# Patient Record
Sex: Female | Born: 1955 | Race: White | Hispanic: No | Marital: Single | Smoking: Former smoker
Health system: Southern US, Community
[De-identification: ages and names within clinical notes are randomized; demographics above are authoritative.]

## PROBLEM LIST (undated history)

## (undated) DIAGNOSIS — N189 Chronic kidney disease, unspecified: Secondary | ICD-10-CM

## (undated) DIAGNOSIS — F419 Anxiety disorder, unspecified: Secondary | ICD-10-CM

## (undated) DIAGNOSIS — I509 Heart failure, unspecified: Secondary | ICD-10-CM

## (undated) DIAGNOSIS — M8580 Other specified disorders of bone density and structure, unspecified site: Secondary | ICD-10-CM

## (undated) DIAGNOSIS — E119 Type 2 diabetes mellitus without complications: Secondary | ICD-10-CM

## (undated) DIAGNOSIS — A630 Anogenital (venereal) warts: Secondary | ICD-10-CM

## (undated) DIAGNOSIS — J449 Chronic obstructive pulmonary disease, unspecified: Secondary | ICD-10-CM

## (undated) DIAGNOSIS — E669 Obesity, unspecified: Secondary | ICD-10-CM

## (undated) DIAGNOSIS — E781 Pure hyperglyceridemia: Secondary | ICD-10-CM

## (undated) HISTORY — DX: Heart failure, unspecified: I50.9

## (undated) HISTORY — DX: Obesity, unspecified: E66.9

## (undated) HISTORY — DX: Anxiety disorder, unspecified: F41.9

## (undated) HISTORY — PX: ABDOMINAL HYSTERECTOMY: SHX81

## (undated) HISTORY — DX: Type 2 diabetes mellitus without complications: E11.9

## (undated) HISTORY — PX: FRACTURE SURGERY: SHX138

## (undated) HISTORY — DX: Other specified disorders of bone density and structure, unspecified site: M85.80

## (undated) HISTORY — DX: Chronic obstructive pulmonary disease, unspecified: J44.9

## (undated) HISTORY — DX: Pure hyperglyceridemia: E78.1

## (undated) HISTORY — PX: CATARACT EXTRACTION: SUR2

## (undated) HISTORY — DX: Anogenital (venereal) warts: A63.0

## (undated) HISTORY — DX: Chronic kidney disease, unspecified: N18.9

---

## 2007-09-02 HISTORY — PX: OTHER SURGICAL HISTORY: SHX169

## 2010-10-23 ENCOUNTER — Encounter: Payer: Self-pay | Admitting: Nurse Practitioner

## 2011-08-19 ENCOUNTER — Ambulatory Visit: Payer: PRIVATE HEALTH INSURANCE | Attending: Neurology | Admitting: Physical Therapy

## 2011-08-19 DIAGNOSIS — M545 Low back pain, unspecified: Secondary | ICD-10-CM | POA: Insufficient documentation

## 2011-08-19 DIAGNOSIS — R5381 Other malaise: Secondary | ICD-10-CM | POA: Insufficient documentation

## 2011-08-19 DIAGNOSIS — M542 Cervicalgia: Secondary | ICD-10-CM | POA: Insufficient documentation

## 2011-08-19 DIAGNOSIS — IMO0001 Reserved for inherently not codable concepts without codable children: Secondary | ICD-10-CM | POA: Insufficient documentation

## 2011-08-21 ENCOUNTER — Ambulatory Visit: Payer: PRIVATE HEALTH INSURANCE | Admitting: Physical Therapy

## 2011-08-27 ENCOUNTER — Encounter: Payer: PRIVATE HEALTH INSURANCE | Admitting: *Deleted

## 2011-08-29 ENCOUNTER — Ambulatory Visit: Payer: PRIVATE HEALTH INSURANCE | Admitting: *Deleted

## 2011-09-04 ENCOUNTER — Ambulatory Visit: Payer: PRIVATE HEALTH INSURANCE | Attending: Neurology | Admitting: Physical Therapy

## 2011-09-04 ENCOUNTER — Encounter: Payer: PRIVATE HEALTH INSURANCE | Admitting: Physical Therapy

## 2011-09-04 DIAGNOSIS — M542 Cervicalgia: Secondary | ICD-10-CM | POA: Insufficient documentation

## 2011-09-04 DIAGNOSIS — IMO0001 Reserved for inherently not codable concepts without codable children: Secondary | ICD-10-CM | POA: Insufficient documentation

## 2011-09-04 DIAGNOSIS — R5381 Other malaise: Secondary | ICD-10-CM | POA: Insufficient documentation

## 2011-09-04 DIAGNOSIS — M545 Low back pain, unspecified: Secondary | ICD-10-CM | POA: Insufficient documentation

## 2011-09-05 ENCOUNTER — Encounter: Payer: PRIVATE HEALTH INSURANCE | Admitting: Physical Therapy

## 2011-09-05 ENCOUNTER — Ambulatory Visit: Payer: PRIVATE HEALTH INSURANCE | Admitting: Physical Therapy

## 2011-09-12 ENCOUNTER — Ambulatory Visit: Payer: PRIVATE HEALTH INSURANCE | Admitting: Physical Therapy

## 2011-09-13 ENCOUNTER — Ambulatory Visit: Payer: PRIVATE HEALTH INSURANCE | Admitting: Physical Therapy

## 2011-09-19 ENCOUNTER — Ambulatory Visit: Payer: PRIVATE HEALTH INSURANCE | Admitting: Physical Therapy

## 2011-09-20 ENCOUNTER — Ambulatory Visit: Payer: PRIVATE HEALTH INSURANCE | Admitting: Physical Therapy

## 2011-09-26 ENCOUNTER — Ambulatory Visit: Payer: PRIVATE HEALTH INSURANCE | Admitting: Physical Therapy

## 2011-09-27 ENCOUNTER — Ambulatory Visit: Payer: PRIVATE HEALTH INSURANCE | Admitting: Physical Therapy

## 2012-07-29 LAB — HM MAMMOGRAPHY

## 2012-10-08 ENCOUNTER — Telehealth: Payer: Self-pay | Admitting: Nurse Practitioner

## 2012-10-08 NOTE — Telephone Encounter (Signed)
Appt given for 5/9

## 2012-10-09 ENCOUNTER — Encounter: Payer: Self-pay | Admitting: Nurse Practitioner

## 2012-10-09 ENCOUNTER — Ambulatory Visit (INDEPENDENT_AMBULATORY_CARE_PROVIDER_SITE_OTHER): Payer: PRIVATE HEALTH INSURANCE | Admitting: Nurse Practitioner

## 2012-10-09 VITALS — BP 109/65 | HR 108 | Temp 98.7°F | Ht 64.0 in | Wt 208.0 lb

## 2012-10-09 DIAGNOSIS — A630 Anogenital (venereal) warts: Secondary | ICD-10-CM

## 2012-10-09 DIAGNOSIS — E119 Type 2 diabetes mellitus without complications: Secondary | ICD-10-CM | POA: Insufficient documentation

## 2012-10-09 DIAGNOSIS — J449 Chronic obstructive pulmonary disease, unspecified: Secondary | ICD-10-CM | POA: Insufficient documentation

## 2012-10-09 DIAGNOSIS — E785 Hyperlipidemia, unspecified: Secondary | ICD-10-CM | POA: Insufficient documentation

## 2012-10-09 DIAGNOSIS — E559 Vitamin D deficiency, unspecified: Secondary | ICD-10-CM | POA: Insufficient documentation

## 2012-10-09 DIAGNOSIS — M8580 Other specified disorders of bone density and structure, unspecified site: Secondary | ICD-10-CM | POA: Insufficient documentation

## 2012-10-09 DIAGNOSIS — R609 Edema, unspecified: Secondary | ICD-10-CM | POA: Insufficient documentation

## 2012-10-09 DIAGNOSIS — I10 Essential (primary) hypertension: Secondary | ICD-10-CM | POA: Insufficient documentation

## 2012-10-09 DIAGNOSIS — F411 Generalized anxiety disorder: Secondary | ICD-10-CM | POA: Insufficient documentation

## 2012-10-09 NOTE — Patient Instructions (Signed)
Genital Warts Genital warts are a sexually transmitted infection. They may appear as small bumps on the tissues of the genital area. CAUSES  Genital warts are caused by a virus called human papillomavirus (HPV). HPV is the most common sexually transmitted disease (STD) and infection of the sex organs. This infection is spread by having unprotected sex with an infected person. It can be spread by vaginal, anal, and oral sex. Many people do not know they are infected. They may be infected for years without problems. However, even if they do not have problems, they can unknowingly pass the infection to their sexual partners. SYMPTOMS   Itching and irritation in the genital area.  Warts that bleed.  Painful sexual intercourse. DIAGNOSIS  Warts are usually recognized with the naked eye on the vagina, vulva, perineum, anus, and rectum. Certain tests can also diagnose genital warts, such as:  A Pap test.  A tissue sample (biopsy) exam.  Colposcopy. A magnifying tool is used to examine the vagina and cervix. The HPV cells will change color when certain solutions are used. TREATMENT  Warts can be removed by:  Applying certain chemicals, such as cantharidin or podophyllin.  Liquid nitrogen freezing (cryotherapy).  Immunotherapy with candida or trichophyton injections.  Laser treatment.  Burning with an electrified probe (electrocautery).  Interferon injections.  Surgery. PREVENTION  HPV vaccination can help prevent HPV infections that cause genital warts and that cause cancer of the cervix. It is recommended that the vaccination be given to people between the ages 44 to 62 years old. The vaccine might not work as well or might not work at all if you already have HPV. It should not be given to pregnant women. HOME CARE INSTRUCTIONS   It is important to follow your caregiver's instructions. The warts will not go away without treatment. Repeat treatments are often needed to get rid of warts.  Even after it appears that the warts are gone, the normal tissue underneath often remains infected.  Do not try to treat genital warts with medicine used to treat hand warts. This type of medicine is strong and can burn the skin in the genital area, causing more damage.  Tell your past and current sexual partner(s) that you have genital warts. They may be infected also and need treatment.  Avoid sexual contact while being treated.  Do not touch or scratch the warts. The infection may spread to other parts of your body.  Women with genital warts should have a cervical cancer check (Pap test) at least once a year. This type of cancer is slow-growing and can be cured if found early. Chances of developing cervical cancer are increased with HPV.  Inform your obstetrician about your warts in the event of pregnancy. This virus can be passed to the baby's respiratory tract. Discuss this with your caregiver.  Use a condom during sexual intercourse. Following treatment, the use of condoms will help prevent reinfection.  Ask your caregiver about using over-the-counter anti-itch creams. SEEK MEDICAL CARE IF:   Your treated skin becomes red, swollen, or painful.  You have a fever.  You feel generally ill.  You feel little lumps in and around your genital area.  You are bleeding or have painful sexual intercourse. MAKE SURE YOU:   Understand these instructions.  Will watch your condition.  Will get help right away if you are not doing well or get worse. Document Released: 05/17/2000 Document Revised: 08/12/2011 Document Reviewed: 11/26/2010 [facility] Patient Information 2013 [place].

## 2012-10-09 NOTE — Progress Notes (Signed)
  Subjective:    Patient ID: Annette Gibson, female    DOB: 02-19-1956, 57 y.o.   MRN: [id number]  HPI patient in today wanting a skin lesion removed from her perineal area. Has been there for years but is getting bigger. Patient wants it removed.    Review of Systems  All other systems reviewed and are negative.       Objective:   Physical Exam  Skin:  Cauliflower appearing flesh colored lesion left perineal area.          Assessment & Plan:  1. Perianal condylomata DO not pick or scratch area - Ambulatory referral to Gynecology  [place], [place]

## 2012-11-20 ENCOUNTER — Telehealth: Payer: Self-pay | Admitting: Nurse Practitioner

## 2012-11-20 NOTE — Telephone Encounter (Signed)
Samples up front - ok per mmm

## 2012-12-03 ENCOUNTER — Ambulatory Visit (INDEPENDENT_AMBULATORY_CARE_PROVIDER_SITE_OTHER): Payer: 59 | Admitting: General Surgery

## 2012-12-03 ENCOUNTER — Encounter (INDEPENDENT_AMBULATORY_CARE_PROVIDER_SITE_OTHER): Payer: Self-pay | Admitting: General Surgery

## 2012-12-03 VITALS — BP 142/80 | HR 64 | Resp 14 | Ht 64.0 in | Wt 210.2 lb

## 2012-12-03 DIAGNOSIS — A630 Anogenital (venereal) warts: Secondary | ICD-10-CM

## 2012-12-03 NOTE — Progress Notes (Signed)
Patient ID: Annette Gibson, female   DOB: Jun 15, 1955, 57 y.o.   MRN: [id number]  No chief complaint on file.   HPI Annette Gibson. Bencosme is a 57 y.o. female.  Referred by Dr. Adah Perl for evaluation of anal condyloma.  She was concerned for the close proximity to the rectum. She says that she had noticed the lesion in the area for over a year now but it has been increasing in size. Does not causing her discomfort. She denies any other lesions. She denies any history of HIV HPI  Past Medical History  Diagnosis Date  . Anxiety   . Vitamin D deficiency   . Obesity     metabolic syndrome   . Hypertriglyceridemia   . Osteopenia   . COPD (chronic obstructive pulmonary disease)   . Diabetes mellitus without complication   . Anal warts     anal warts    Past Surgical History  Procedure Laterality Date  . Lt ganglion cyst removal  09/2007  . Abdominal hysterectomy    . Fracture surgery      Right leg and ankle    History reviewed. No pertinent family history.  Social History History  Substance Use Topics  . Smoking status: Current Every Day Smoker  . Smokeless tobacco: Not on file  . Alcohol Use: Not on file    Allergies  Allergen Reactions  . Celebrex (Celecoxib)     Current Outpatient Prescriptions  Medication Sig Dispense Refill  . albuterol (PROVENTIL HFA;VENTOLIN HFA) 108 (90 BASE) MCG/ACT inhaler Inhale 1 puff into the lungs every 6 (six) hours as needed.        Marland Kitchen alendronate (FOSAMAX) 70 MG tablet Take 70 mg by mouth every 7 (seven) days. Take with a full glass of water on an empty stomach.      Marland Kitchen atorvastatin (LIPITOR) 40 MG tablet Take 40 mg by mouth daily.      . fenofibrate 160 MG tablet Take 160 mg by mouth daily.      . Fluticasone Furoate-Vilanterol (BREO ELLIPTA) 100-25 MCG/INH AEPB Inhale into the lungs as needed.      . furosemide (LASIX) 20 MG tablet Take 20 mg by mouth daily.      . metFORMIN (GLUCOPHAGE) 500 MG tablet Take 500 mg by mouth daily.      .  Probiotic Product (FLORAJEN3 PO) Take 460 mg by mouth daily. Take one tablet by mouth daily        . Fluticasone-Salmeterol (ADVAIR) 250-50 MCG/DOSE AEPB Inhale 1 puff into the lungs every 12 (twelve) hours.      Marland Kitchen levofloxacin (LEVAQUIN) 500 MG tablet Take 500 mg by mouth daily. take one tablet by mouth daily for 5 days       . predniSONE (DELTASONE) 10 MG tablet Take 10 mg by mouth daily. Take four tablets by mouth daily          No current facility-administered medications for this visit.    Review of Systems Review of Systems All other review of systems negative or noncontributory except as stated in the HPI  Blood pressure 142/80, pulse 64, resp. rate 14, height _0  (1.626 m), weight 210 lb 3.2 oz (95.346 kg).  Physical Exam Physical Exam Physical Exam  Nursing note and vitals reviewed. Constitutional: She is oriented to person, place, and time. She appears well-developed and well-nourished. No distress.  HENT:  Head: Normocephalic and atraumatic.  Mouth/Throat: No oropharyngeal exudate.  Eyes: Conjunctivae and EOM are normal. Pupils  are equal, round, and reactive to light. Right eye exhibits no discharge. Left eye exhibits no discharge. No scleral icterus.  Neck: Normal range of motion. Neck supple. No tracheal deviation present.  Cardiovascular: Normal rate, regular rhythm, normal heart sounds and intact distal pulses.   Pulmonary/Chest: Effort normal and breath sounds normal. No stridor. No respiratory distress. She has no wheezes.  Abdominal: Soft. Bowel sounds are normal. She exhibits no distension and no mass. There is no tenderness. There is no rebound and no guarding.  Musculoskeletal: Normal range of motion. She exhibits no edema and no tenderness.  Neurological: She is alert and oriented to person, place, and time.  Skin: Skin is warm and dry. No rash noted. She is not diaphoretic. No erythema. No pallor. She has a 2cm condyloma anterior to the rectum and in the  perineum area.  No other lesions identified. Psychiatric: She has a normal mood and affect. Her behavior is normal. Judgment and thought content normal.    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 I discussed with her the options for surgical excision versus topical medications versus laser therapy. She is not interested in surgical excision at this time but she says that she was referred down here for laser therapy and she would like to have this option. I offered to refer her to our colorectal surgeon for consideration for laser treatment.  If she would like to have these treated with excision, then we would be happy to set her up for removal.     Plan    Schedule her with Dr. Marcello Moores to consider laser treatment        [place], [place] 12/03/2012, 8:46 AM

## 2012-12-28 ENCOUNTER — Ambulatory Visit (INDEPENDENT_AMBULATORY_CARE_PROVIDER_SITE_OTHER): Payer: 59 | Admitting: General Surgery

## 2012-12-28 ENCOUNTER — Encounter (INDEPENDENT_AMBULATORY_CARE_PROVIDER_SITE_OTHER): Payer: Self-pay | Admitting: General Surgery

## 2012-12-28 ENCOUNTER — Telehealth (INDEPENDENT_AMBULATORY_CARE_PROVIDER_SITE_OTHER): Payer: Self-pay | Admitting: General Surgery

## 2012-12-28 VITALS — BP 140/82 | HR 84 | Temp 98.0°F | Resp 16 | Ht 64.0 in | Wt 209.8 lb

## 2012-12-28 DIAGNOSIS — A630 Anogenital (venereal) warts: Secondary | ICD-10-CM

## 2012-12-28 NOTE — Telephone Encounter (Signed)
Discussed pt financial responsibility and placed in pending folder

## 2012-12-28 NOTE — Patient Instructions (Signed)
Anal Warts  What are anal warts? Anal warts (also called "condyloma acuminata") are a condition that affects the area around and inside the anus. They may also affect the skin of the genital area. They first appear as tiny spots or growths, perhaps as small as the head of a pin, and may grow quite large and cover the entire anal area. Usually, they do not cause pain or discomfort to afflicted individuals and patients may be unaware that the warts are present. Some patients will experience symptoms, such as itching, bleeding, mucus discharge and/or a feeling of a lump or mass in the anal area.  What causes anal warts? They are caused by the human papilloma virus (HPV), which is transmitted from person to person by direct contact. HPV is considered a sexually transmitted disease (STD). You do not have to have anal intercourse to develop anal warts. Do anal warts always need to be removed? Yes. If they are not removed, the warts usually grow larger and multiply. Left untreated, the warts may lead to an increased risk of cancer in the affected area. What treatments are available? If warts are very small and are located only on the skin around the anus, they may be treated with a topical medication. They may also be treated by freezing the warts with liquid nitrogen or removed surgically. Surgery typically involves cutting or burning the warts off. While this provides immediate results, it must be performed using either a local anesthetic - such as novocaine - or a general or spinal anesthetic, depending on the number and exact location of warts being treated. It is important that an internal anal examination with an instrument called an anoscope be done by your treating physician to ensure you do not have any inside the anal canal (internal anal warts). Internal anal warts may not be as suitable for treatment by topical medications, and may need to be treated surgically. Additionally, your physician may wish to  examine the entire pelvic region to include the vaginal or penile area to look for other warts that may require treatment. Must I be hospitalized for surgical treatment? Surgical treatment of anal warts is usually performed as outpatient surgery. How much time will I lose from work after surgical treatment? Most people are moderately uncomfortable for a few days after treatment and pain medication may be prescribed. Depending on the extent of the disease, some people return to work the next day, while others may remain out of work for several days to weeks. Will a single treatment cure the problem? When warts are extensive, your surgeon may wish to perform the surgery in stages. Additionally, recurrent warts are common. The virus that causes the warts can live concealed in tissues that appear normal for several months before another wart develops. As new warts develop, they usually can be treated in the physician's office. Sometimes new warts develop so rapidly that office treatment would be quite uncomfortable. In these situations, a second and, occasionally, third outpatient surgical visit may be recommended. How long is treatment usually continued? Follow-up visits are necessary at frequent intervals for several months after all warts appear to be gone, to be certain that no new warts occur. What can be done to avoid getting these warts again? In some cases, warts may recur repeatedly after successful removal, since the virus that causes the warts often persists in a dormant state in body tissues. Discuss with your physician how often you should be evaluated for recurrent warts. Abstain from sexual  contact with individuals who have anal (or genital) warts. Since many individuals may be unaware that they suffer from this condition, sexual abstinence, condom protection or limiting sexual contact to single partner will reduce your potential exposure to the contagious virus that causes these warts. As a  precaution, sexual partners ought to be checked for warts and other sexual transmitted diseases, even if they have no symptoms. What is a colon and rectal surgeon? Colon and rectal surgeons are experts in the surgical and non-surgical treatment of diseases of the colon, rectum and anus. They have completed advanced surgical training in the treatment of these diseases as well as full general surgical training. Board-certified colon and rectal surgeons complete residencies in general surgery and colon and rectal surgery, and pass intensive examinations conducted by the American Board of Surgery and the American Board of Colon and Rectal Surgery. They are well-versed in the treatment of both benign and malignant diseases of the colon, rectum and anus and are able to perform routine screening examinations and surgically treat conditions if indicated to do so. author: Heath Lark, MD, FASCRS, on behalf of the [place]  2012 American Society of Colon & Rectal Surgeons

## 2012-12-28 NOTE — Progress Notes (Signed)
Chief Complaint  Patient presents with  . New Evaluation    eval anal condyloma    HISTORY:  Annette Gibson is a 57 y.o. female who presents to clinic with condyloma.  She is here today to discuss laser treatment.  She denies any pain or bleeding.  She has never had this before.  She has had the lesion for at least several years.  Past Medical History  Diagnosis Date  . Anxiety   . Vitamin D deficiency   . Obesity     metabolic syndrome   . Hypertriglyceridemia   . Osteopenia   . COPD (chronic obstructive pulmonary disease)   . Diabetes mellitus without complication   . Anal warts     anal warts       Past Surgical History  Procedure Laterality Date  . Lt ganglion cyst removal  09/2007  . Abdominal hysterectomy    . Fracture surgery      Right leg and ankle      Current Outpatient Prescriptions  Medication Sig Dispense Refill  . albuterol (PROVENTIL HFA;VENTOLIN HFA) 108 (90 BASE) MCG/ACT inhaler Inhale 1 puff into the lungs every 6 (six) hours as needed.        Marland Kitchen alendronate (FOSAMAX) 70 MG tablet Take 70 mg by mouth every 7 (seven) days. Take with a full glass of water on an empty stomach.      Marland Kitchen atorvastatin (LIPITOR) 40 MG tablet Take 40 mg by mouth daily.      . fenofibrate 160 MG tablet Take 160 mg by mouth daily.      . Fluticasone Furoate-Vilanterol (BREO ELLIPTA) 100-25 MCG/INH AEPB Inhale into the lungs as needed.      . Fluticasone-Salmeterol (ADVAIR) 250-50 MCG/DOSE AEPB Inhale 1 puff into the lungs every 12 (twelve) hours.      . furosemide (LASIX) 20 MG tablet Take 20 mg by mouth daily.      Marland Kitchen levofloxacin (LEVAQUIN) 500 MG tablet Take 500 mg by mouth daily. take one tablet by mouth daily for 5 days       . metFORMIN (GLUCOPHAGE) 500 MG tablet Take 500 mg by mouth daily.      . predniSONE (DELTASONE) 10 MG tablet Take 10 mg by mouth daily. Take four tablets by mouth daily         . Probiotic Product (FLORAJEN3 PO) Take 460 mg by mouth daily. Take one tablet  by mouth daily         No current facility-administered medications for this visit.     Allergies  Allergen Reactions  . Celebrex (Celecoxib)       History reviewed. No pertinent family history.    History   Social History  . Marital Status: Divorced    Spouse Name: N/A    Number of Children: N/A  . Years of Education: N/A   Social History Main Topics  . Smoking status: Current Every Day Smoker  . Smokeless tobacco: None  . Alcohol Use: None  . Drug Use: None  . Sexually Active: None   Other Topics Concern  . None   Social History Narrative  . None       REVIEW OF SYSTEMS - PERTINENT POSITIVES ONLY: Review of Systems - General ROS: negative for - chills or fever Hematological and Lymphatic ROS: negative for - bleeding problems Respiratory ROS: no cough, shortness of breath, or wheezing Cardiovascular ROS: no chest pain or dyspnea on exertion Gastrointestinal ROS: no abdominal pain, change  in bowel habits, or black or bloody stools Genito-Urinary ROS: no dysuria, trouble voiding, or hematuria  EXAM: Filed Vitals:   12/28/12 1022  BP: 140/82  Pulse: 84  Temp: 98 F (36.7 C)  Resp: 16    General appearance: alert and cooperative Resp: clear to auscultation bilaterally Cardio: regular rate and rhythm GI: soft, non-tender; bowel sounds normal; no masses,  no organomegaly Anal exam: anterior perianal condyloma, no other lesions seen Internal anal exam deferred  ASSESSMENT AND PLAN: Annette Gibson is a 57 y.o. F with an anal condyloma.  We discussed the treatment options in detail.  She would like to proceed with laser ablation.  We will schedule this at her convenience.    We discussed the management of anal warts. We discussed chemical destruction, immunotherapy, and surgical excision. I discussed the pros and cons of each approach. We discussed the risk and benefits and the expected outcome with chemical destruction with agents such as podophyllin. I  explained that podophyllin is generally not been effective and has a high recurrence rate. We discussed the use of Aldara ointment. I explained that it has a 30-70% chance at resolving or at least reducing the number of anal warts. I explained that it is applied 3 times a week at night and left on overnight. I explained that skin irritation is the most common side effect. We then discussed surgical excision specifically excision and fulguration. I explained how the surgery is performed. I explained that it can be painful however it generally has the highest success rate. We discussed the risk and benefits of surgery including but not limited to bleeding, infection, injury to surrounding structures, need to do a formal anoscopic exam to evaluate for anal canal warts, urinary retention, wart recurrence, and general anesthesia risk. We discussed the typical aftercare.     Rosario Adie, MD Colon and Rectal Surgery / [facility]      Visit Diagnoses: 1. Anal wart     Primary Care Physician: Redge Gainer, MD

## 2013-01-27 ENCOUNTER — Other Ambulatory Visit: Payer: Self-pay

## 2013-01-27 MED ORDER — FLUTICASONE FUROATE-VILANTEROL 100-25 MCG/INH IN AEPB: 1.0000 | INHALATION_SPRAY | RESPIRATORY_TRACT | Status: DC | PRN

## 2013-01-27 NOTE — Telephone Encounter (Signed)
Last seen 10/09/12  MMM  Patient requesting this RX from [facility]

## 2013-02-15 ENCOUNTER — Telehealth: Payer: Self-pay | Admitting: Nurse Practitioner

## 2013-02-16 NOTE — Telephone Encounter (Signed)
Appt scheduled

## 2013-03-01 ENCOUNTER — Encounter: Payer: Self-pay | Admitting: Nurse Practitioner

## 2013-03-01 ENCOUNTER — Ambulatory Visit (INDEPENDENT_AMBULATORY_CARE_PROVIDER_SITE_OTHER): Payer: PRIVATE HEALTH INSURANCE | Admitting: Nurse Practitioner

## 2013-03-01 VITALS — BP 105/65 | HR 85 | Temp 97.8°F | Ht 65.0 in | Wt 212.0 lb

## 2013-03-01 DIAGNOSIS — E119 Type 2 diabetes mellitus without complications: Secondary | ICD-10-CM

## 2013-03-01 DIAGNOSIS — I10 Essential (primary) hypertension: Secondary | ICD-10-CM

## 2013-03-01 DIAGNOSIS — E559 Vitamin D deficiency, unspecified: Secondary | ICD-10-CM

## 2013-03-01 DIAGNOSIS — M899 Disorder of bone, unspecified: Secondary | ICD-10-CM

## 2013-03-01 DIAGNOSIS — M8580 Other specified disorders of bone density and structure, unspecified site: Secondary | ICD-10-CM

## 2013-03-01 DIAGNOSIS — J441 Chronic obstructive pulmonary disease with (acute) exacerbation: Secondary | ICD-10-CM

## 2013-03-01 DIAGNOSIS — Z23 Encounter for immunization: Secondary | ICD-10-CM

## 2013-03-01 DIAGNOSIS — E785 Hyperlipidemia, unspecified: Secondary | ICD-10-CM

## 2013-03-01 LAB — POCT UA - MICROALBUMIN: Microalbumin Ur, POC: 20 mg/L

## 2013-03-01 LAB — POCT GLYCOSYLATED HEMOGLOBIN (HGB A1C): Hemoglobin A1C: 7.7

## 2013-03-01 MED ORDER — ATORVASTATIN CALCIUM 40 MG PO TABS: 40.0000 mg | ORAL_TABLET | Freq: Every day | ORAL | Status: DC

## 2013-03-01 MED ORDER — METFORMIN HCL 500 MG PO TABS: 500.0000 mg | ORAL_TABLET | Freq: Two times a day (BID) | ORAL | Status: DC

## 2013-03-01 MED ORDER — FUROSEMIDE 20 MG PO TABS: 20.0000 mg | ORAL_TABLET | Freq: Every day | ORAL | Status: DC

## 2013-03-01 MED ORDER — FLUTICASONE FUROATE-VILANTEROL 100-25 MCG/INH IN AEPB: 1.0000 | INHALATION_SPRAY | RESPIRATORY_TRACT | Status: DC | PRN

## 2013-03-01 MED ORDER — FENOFIBRATE 160 MG PO TABS: 160.0000 mg | ORAL_TABLET | Freq: Every day | ORAL | Status: DC

## 2013-03-01 NOTE — Progress Notes (Signed)
Subjective:    Patient ID: Annette Gibson, female    DOB: 1955/08/26, 57 y.o.   MRN: [id number]  Diabetes She presents for her follow-up diabetic visit. She has type 2 diabetes mellitus. Her disease course has been stable. There are no hypoglycemic associated symptoms. There are no diabetic associated symptoms. Pertinent negatives for diabetes include no blurred vision, no foot paresthesias, no foot ulcerations, no polydipsia, no polyphagia, no visual change and no weakness. There are no hypoglycemic complications. There are no diabetic complications. Pertinent negatives for diabetic complications include no peripheral neuropathy. Risk factors for coronary artery disease include diabetes mellitus, dyslipidemia, obesity, tobacco exposure and post-menopausal. Current diabetic treatment includes oral agent (monotherapy). She is compliant with treatment all of the time. Her weight is stable. She is following a generally healthy diet. She participates in exercise three times a week. An ACE inhibitor/angiotensin II receptor blocker is not being taken. She does not see a podiatrist.Eye exam is not current (Two years ago-Pt encouraged to make appt).  Hyperlipidemia This is a chronic problem. The current episode started more than 1 year ago. The problem is controlled. Recent lipid tests were reviewed and are normal. Exacerbating diseases include diabetes and obesity. She has no history of liver disease. Factors aggravating her hyperlipidemia include smoking. Associated symptoms include shortness of breath. Pertinent negatives include no leg pain or myalgias. Current antihyperlipidemic treatment includes statins. The current treatment provides moderate improvement of lipids. Risk factors for coronary artery disease include diabetes mellitus, dyslipidemia, obesity and post-menopausal.   COPD Pt takes Breo that seems to work-likes it much better than the advair. Has albuterol inhaler that she uses twice a  week-when she is cleaning the house or working in the yard.  Peripheral Edema Pt takes lasix daily- Denies any swelling at this time and is working good.   Review of Systems  Eyes: Negative for blurred vision.  Respiratory: Positive for shortness of breath.   Endocrine: Negative for polydipsia and polyphagia.  Musculoskeletal: Negative for myalgias.  Neurological: Negative for weakness.  All other systems reviewed and are negative.       Objective:   Physical Exam  Vitals reviewed. Constitutional: She is oriented to person, place, and time. She appears well-developed and well-nourished.  HENT:  Head: Normocephalic.  Right Ear: External ear normal.  Left Ear: External ear normal.  Nose: Nose normal.  Mouth/Throat: Oropharynx is clear and moist.  Eyes: Conjunctivae and EOM are normal. Pupils are equal, round, and reactive to light.  Neck: Normal range of motion. Neck supple. No thyromegaly present.  Cardiovascular: Normal rate, regular rhythm, normal heart sounds and intact distal pulses.   Pulmonary/Chest: Effort normal. No respiratory distress. She has wheezes.  Dry coarse nonproductive cough   Abdominal: Soft. Bowel sounds are normal. She exhibits no distension. There is no tenderness.  Musculoskeletal: Normal range of motion. She exhibits no edema and no tenderness.  Neurological: She is alert and oriented to person, place, and time.  Skin: Skin is warm and dry. No rash noted. No erythema.  Psychiatric: She has a normal mood and affect. Her behavior is normal. Judgment and thought content normal.   BP 105/65  Pulse 85  Temp(Src) 97.8 F (36.6 C) (Oral)  Ht _0  (1.651 m)  Wt 212 lb (96.163 kg)  BMI 35.28 kg/m2  Results for orders placed in visit on 03/01/13  POCT GLYCOSYLATED HEMOGLOBIN (HGB A1C)      Result Value Range   Hemoglobin A1C 7.7  Assessment & Plan:   1. Type II or unspecified type diabetes mellitus without mention of complication,  not stated as uncontrolled   2. Unspecified vitamin D deficiency   3. COPD exacerbation   4. Hyperlipidemia   5. Hypertension   6. Osteopenia    Orders Placed This Encounter  Procedures  . CMP14+EGFR  . NMR, lipoprofile  . POCT glycosylated hemoglobin (Hb A1C)  . POCT UA - Microalbumin   Meds ordered this encounter  Medications  . atorvastatin (LIPITOR) 40 MG tablet    Sig: Take 1 tablet (40 mg total) by mouth daily.    Dispense:  30 tablet    Refill:  5  . fenofibrate 160 MG tablet    Sig: Take 1 tablet (160 mg total) by mouth daily.    Dispense:  30 tablet    Refill:  5  . Fluticasone Furoate-Vilanterol (BREO ELLIPTA) 100-25 MCG/INH AEPB    Sig: Inhale 1 puff into the lungs as needed.    Dispense:  28 each    Refill:  5  . furosemide (LASIX) 20 MG tablet    Sig: Take 1 tablet (20 mg total) by mouth daily.    Dispense:  30 tablet    Refill:  5  . metFORMIN (GLUCOPHAGE) 500 MG tablet    Sig: Take 1 tablet (500 mg total) by mouth 2 (two) times daily with a meal.    Dispense:  60 tablet    Refill:  5    Continue all meds- Glucophage increased to 553m BID from Daily Labs pending Diet and exercise encouraged Health maintenance reviewed Follow up in 3 months Flu shot given today  Mary-Margaret MHassell Done FNP

## 2013-03-01 NOTE — Patient Instructions (Addendum)
Basic Carbohydrate Counting Basic carbohydrate counting is a way to plan meals. It is done by counting the amount of carbohydrate in foods. Foods that have carbohydrates are starches (grains, beans, starchy vegetables) and sweets. Eating carbohydrates increases blood glucose (sugar) levels. People with diabetes use carbohydrate counting to help keep their blood glucose at a normal level.  COUNTING CARBOHYDRATES IN FOODS The first step in counting carbohydrates is to learn how many carbohydrate servings you should have in every meal. A dietitian can plan this for you. After learning the amount of carbohydrates to include in your meal plan, you can start to choose the carbohydrate-containing foods you want to eat.  There are 2 ways to identify the amount of carbohydrates in the foods you eat.  Read the Nutrition Facts panel on food labels. You need 2 pieces of information from the Nutrition Facts panel to count carbohydrates this way:  Serving size.  Total carbohydrate (in grams). Decide how many servings you will be eating. If it is 1 serving, you will be eating the amount of carbohydrate listed on the panel. If you will be eating 2 servings, you will be eating double the amount of carbohydrate listed on the panel.   Learn serving sizes. A serving size of most carbohydrate-containing foods is about 15 grams (g). Listed below are single serving sizes of common carbohydrate-containing foods:  1 slice bread.   cup unsweetened, dry cereal.   cup hot cereal.   cup rice.   cup mashed potatoes.   cup pasta.  1 cup fresh fruit.   cup canned fruit.  1 cup milk (whole, 2%, or skim).   cup starchy vegetables (peas, corn, or potatoes). Counting carbohydrates this way is similar to looking on the Nutrition Facts panel. Decide how many servings you will eat first. Multiply the number of servings you eat by 15 g. For example, if you have 2 cups of strawberries, you had 2 servings. That means  you had 30 g of carbohydrate (2 servings x 15 g = 30 g). CALCULATING CARBOHYDRATES IN A MEAL Sample dinner  3 oz chicken breast.   cup brown rice.   cup corn.  1 cup fat-free milk.  1 cup strawberries with sugar-free whipped topping. Carbohydrate calculation First, identify the foods that contain carbohydrate:  Rice.  Corn.  Milk.  Strawberries. Calculate the number of servings eaten:  2 servings rice.  1 serving corn.  1 serving milk.  1 serving strawberries. Multiply the number of servings by 15 g:  2 servings rice x 15 g = 30 g.  1 serving corn x 15 g = 15 g.  1 serving milk x 15 g = 15 g.  1 serving strawberries x 15 g = 15 g. Add the amounts to find the total carbohydrates eaten: 30 g + 15 g + 15 g + 15 g = 75 g carbohydrate eaten at dinner. Document Released: 05/20/2005 Document Revised: 08/12/2011 Document Reviewed: 04/05/2011 [facility] Patient Information 2014 [place], [place].

## 2013-03-01 NOTE — Addendum Note (Signed)
Addended by: Earlene Plater on: 03/01/2013 09:53 AM   Modules accepted: Orders

## 2013-03-02 LAB — MICROALBUMIN, URINE: Microalbumin, Urine: 16.5 ug/mL (ref 0.0–17.0)

## 2013-03-02 LAB — NMR, LIPOPROFILE
HDL Cholesterol by NMR: 40 mg/dL (ref >=40)
LDL Particle Number: 1261 nmol/L — ABNORMAL HIGH (ref <1000)
LDLC SERPL CALC-MCNC: 64 mg/dL (ref <100)
LP-IR Score: 84 — ABNORMAL HIGH (ref <=45)

## 2013-03-02 LAB — CMP14+EGFR
ALT: 18 IU/L (ref 0–32)
AST: 17 IU/L (ref 0–40)
Albumin/Globulin Ratio: 1.7 (ref 1.1–2.5)
Alkaline Phosphatase: 85 IU/L (ref 39–117)
BUN/Creatinine Ratio: 16 (ref 9–23)
CO2: 30 mmol/L — ABNORMAL HIGH (ref 18–29)
Calcium: 9.7 mg/dL (ref 8.7–10.2)
Creatinine, Ser: 0.77 mg/dL (ref 0.57–1.00)
Globulin, Total: 2.3 g/dL (ref 1.5–4.5)
Potassium: 4.2 mmol/L (ref 3.5–5.2)
Sodium: 142 mmol/L (ref 134–144)

## 2013-03-18 ENCOUNTER — Other Ambulatory Visit: Payer: Self-pay | Admitting: Nurse Practitioner

## 2013-03-22 ENCOUNTER — Other Ambulatory Visit: Payer: Self-pay

## 2013-03-22 MED ORDER — METFORMIN HCL 500 MG PO TABS: 500.0000 mg | ORAL_TABLET | Freq: Two times a day (BID) | ORAL | Status: DC

## 2013-06-15 ENCOUNTER — Encounter: Payer: Self-pay | Admitting: Nurse Practitioner

## 2013-06-15 ENCOUNTER — Ambulatory Visit (INDEPENDENT_AMBULATORY_CARE_PROVIDER_SITE_OTHER): Payer: PRIVATE HEALTH INSURANCE | Admitting: Nurse Practitioner

## 2013-06-15 VITALS — BP 118/75 | HR 85 | Temp 98.1°F | Ht 65.0 in | Wt 213.0 lb

## 2013-06-15 DIAGNOSIS — E559 Vitamin D deficiency, unspecified: Secondary | ICD-10-CM

## 2013-06-15 DIAGNOSIS — F411 Generalized anxiety disorder: Secondary | ICD-10-CM

## 2013-06-15 DIAGNOSIS — E119 Type 2 diabetes mellitus without complications: Secondary | ICD-10-CM

## 2013-06-15 DIAGNOSIS — R609 Edema, unspecified: Secondary | ICD-10-CM

## 2013-06-15 DIAGNOSIS — J441 Chronic obstructive pulmonary disease with (acute) exacerbation: Secondary | ICD-10-CM

## 2013-06-15 DIAGNOSIS — E785 Hyperlipidemia, unspecified: Secondary | ICD-10-CM

## 2013-06-15 DIAGNOSIS — I10 Essential (primary) hypertension: Secondary | ICD-10-CM

## 2013-06-15 LAB — POCT GLYCOSYLATED HEMOGLOBIN (HGB A1C): HEMOGLOBIN A1C: 8.6

## 2013-06-15 MED ORDER — FENOFIBRATE 160 MG PO TABS: 160.0000 mg | ORAL_TABLET | Freq: Every day | ORAL | Status: DC

## 2013-06-15 MED ORDER — FLUTICASONE FUROATE-VILANTEROL 100-25 MCG/INH IN AEPB: 1.0000 | INHALATION_SPRAY | RESPIRATORY_TRACT | Status: DC | PRN

## 2013-06-15 MED ORDER — ALBUTEROL SULFATE HFA 108 (90 BASE) MCG/ACT IN AERS: 1.0000 | INHALATION_SPRAY | Freq: Four times a day (QID) | RESPIRATORY_TRACT | Status: DC | PRN

## 2013-06-15 MED ORDER — ATORVASTATIN CALCIUM 40 MG PO TABS: 40.0000 mg | ORAL_TABLET | Freq: Every day | ORAL | Status: DC

## 2013-06-15 MED ORDER — FUROSEMIDE 20 MG PO TABS: 20.0000 mg | ORAL_TABLET | Freq: Every day | ORAL | Status: DC

## 2013-06-15 MED ORDER — METFORMIN HCL 500 MG PO TABS: 500.0000 mg | ORAL_TABLET | Freq: Two times a day (BID) | ORAL | Status: DC

## 2013-06-15 MED ORDER — METFORMIN HCL 1000 MG PO TABS: 1000.0000 mg | ORAL_TABLET | Freq: Two times a day (BID) | ORAL | Status: DC

## 2013-06-15 NOTE — Patient Instructions (Signed)
Diabetes and Foot Care Diabetes may cause you to have problems because of poor blood supply (circulation) to your feet and legs. This may cause the skin on your feet to become thinner, break easier, and heal more slowly. Your skin may become dry, and the skin may peel and crack. You may also have nerve damage in your legs and feet causing decreased feeling in them. You may not notice minor injuries to your feet that could lead to infections or more serious problems. Taking care of your feet is one of the most important things you can do for yourself.  HOME CARE INSTRUCTIONS  Wear shoes at all times, even in the house. Do not go barefoot. Bare feet are easily injured.  Check your feet daily for blisters, cuts, and redness. If you cannot see the bottom of your feet, use a mirror or ask someone for help.  Wash your feet with warm water (do not use hot water) and mild soap. Then pat your feet and the areas between your toes until they are completely dry. Do not soak your feet as this can dry your skin.  Apply a moisturizing lotion or petroleum jelly (that does not contain alcohol and is unscented) to the skin on your feet and to dry, brittle toenails. Do not apply lotion between your toes.  Trim your toenails straight across. Do not dig under them or around the cuticle. File the edges of your nails with an emery board or nail file.  Do not cut corns or calluses or try to remove them with medicine.  Wear clean socks or stockings every day. Make sure they are not too tight. Do not wear knee-high stockings since they may decrease blood flow to your legs.  Wear shoes that fit properly and have enough cushioning. To break in new shoes, wear them for just a few hours a day. This prevents you from injuring your feet. Always look in your shoes before you put them on to be sure there are no objects inside.  Do not cross your legs. This may decrease the blood flow to your feet.  If you find a minor scrape,  cut, or break in the skin on your feet, keep it and the skin around it clean and dry. These areas may be cleansed with mild soap and water. Do not cleanse the area with peroxide, alcohol, or iodine.  When you remove an adhesive bandage, be sure not to damage the skin around it.  If you have a wound, look at it several times a day to make sure it is healing.  Do not use heating pads or hot water bottles. They may burn your skin. If you have lost feeling in your feet or legs, you may not know it is happening until it is too late.  Make sure your health care provider performs a complete foot exam at least annually or more often if you have foot problems. Report any cuts, sores, or bruises to your health care provider immediately. SEEK MEDICAL CARE IF:   You have an injury that is not healing.  You have cuts or breaks in the skin.  You have an ingrown nail.  You notice redness on your legs or feet.  You feel burning or tingling in your legs or feet.  You have pain or cramps in your legs and feet.  Your legs or feet are numb.  Your feet always feel cold. SEEK IMMEDIATE MEDICAL CARE IF:   There is increasing redness,   swelling, or pain in or around a wound.  There is a red line that goes up your leg.  Pus is coming from a wound.  You develop a fever or as directed by your health care provider.  You notice a bad smell coming from an ulcer or wound. Document Released: 05/17/2000 Document Revised: 01/20/2013 Document Reviewed: 10/27/2012 [facility] Patient Information 2014 [place].

## 2013-06-15 NOTE — Progress Notes (Signed)
Subjective:    Patient ID: Annette Gibson, female    DOB: 1956/03/23, 58 y.o.   MRN: [id number]  Patient here today for follow up- No changes since last visit.  Diabetes She presents for her follow-up diabetic visit. She has type 2 diabetes mellitus. Her disease course has been stable. There are no hypoglycemic associated symptoms. There are no diabetic associated symptoms. Pertinent negatives for diabetes include no blurred vision, no foot paresthesias, no foot ulcerations, no polydipsia, no polyphagia, no visual change and no weakness. There are no hypoglycemic complications. There are no diabetic complications. Pertinent negatives for diabetic complications include no peripheral neuropathy. Risk factors for coronary artery disease include diabetes mellitus, dyslipidemia, obesity, tobacco exposure and post-menopausal. Current diabetic treatment includes oral agent (monotherapy). She is compliant with treatment all of the time. Her weight is stable. She is following a generally healthy diet. She participates in exercise three times a week. An ACE inhibitor/angiotensin II receptor blocker is not being taken. She does not see a podiatrist.Eye exam is not current (Two years ago-Pt encouraged to make appt).  Hyperlipidemia This is a chronic problem. The current episode started more than 1 year ago. The problem is controlled. Recent lipid tests were reviewed and are normal. Exacerbating diseases include diabetes and obesity. She has no history of liver disease. Factors aggravating her hyperlipidemia include smoking. Associated symptoms include shortness of breath. Pertinent negatives include no leg pain or myalgias. Current antihyperlipidemic treatment includes statins. The current treatment provides moderate improvement of lipids. Risk factors for coronary artery disease include diabetes mellitus, dyslipidemia, obesity and post-menopausal.   COPD Pt takes Breo that seems to work-likes it much better  than the advair. Has albuterol inhaler that she uses twice a week-when she is cleaning the house or working in the yard.  Peripheral Edema Pt takes lasix daily- Denies any swelling at this time and is working good.   Review of Systems  Eyes: Negative for blurred vision.  Respiratory: Positive for shortness of breath.   Endocrine: Negative for polydipsia and polyphagia.  Musculoskeletal: Negative for myalgias.  Neurological: Negative for weakness.  All other systems reviewed and are negative.       Objective:   Physical Exam  Vitals reviewed. Constitutional: She is oriented to person, place, and time. She appears well-developed and well-nourished.  HENT:  Head: Normocephalic.  Right Ear: External ear normal.  Left Ear: External ear normal.  Nose: Nose normal.  Mouth/Throat: Oropharynx is clear and moist.  Eyes: Conjunctivae and EOM are normal. Pupils are equal, round, and reactive to light.  Neck: Normal range of motion. Neck supple. No thyromegaly present.  Cardiovascular: Normal rate, regular rhythm, normal heart sounds and intact distal pulses.   Pulmonary/Chest: Effort normal. No respiratory distress. She has wheezes.  Dry coarse nonproductive cough   Abdominal: Soft. Bowel sounds are normal. She exhibits no distension. There is no tenderness.  Musculoskeletal: Normal range of motion. She exhibits no edema and no tenderness.  Neurological: She is alert and oriented to person, place, and time.  Skin: Skin is warm and dry. No rash noted. No erythema.  Psychiatric: She has a normal mood and affect. Her behavior is normal. Judgment and thought content normal.   BP 118/75  Pulse 85  Temp(Src) 98.1 F (36.7 C) (Oral)  Ht _0  (1.651 m)  Wt 213 lb (96.616 kg)  BMI 35.44 kg/m2      Assessment & Plan:   1. Hypertension   2. Hyperlipidemia  3. Type II or unspecified type diabetes mellitus without mention of complication, not stated as uncontrolled   4. GAD  (generalized anxiety disorder)   5. COPD exacerbation   6. Peripheral edema   7. Unspecified vitamin D deficiency    Orders Placed This Encounter  Procedures  . CMP14+EGFR  . NMR, lipoprofile  . POCT glycosylated hemoglobin (Hb A1C)   Meds ordered this encounter  Medications  . DISCONTD: metFORMIN (GLUCOPHAGE) 500 MG tablet    Sig: Take 1 tablet (500 mg total) by mouth 2 (two) times daily.    Dispense:  60 tablet    Refill:  5  . furosemide (LASIX) 20 MG tablet    Sig: Take 1 tablet (20 mg total) by mouth daily.    Dispense:  30 tablet    Refill:  5  . Fluticasone Furoate-Vilanterol (BREO ELLIPTA) 100-25 MCG/INH AEPB    Sig: Inhale 1 puff into the lungs as needed.    Dispense:  60 each    Refill:  5  . fenofibrate 160 MG tablet    Sig: Take 1 tablet (160 mg total) by mouth daily.    Dispense:  30 tablet    Refill:  5  . atorvastatin (LIPITOR) 40 MG tablet    Sig: Take 1 tablet (40 mg total) by mouth daily at 6 PM.    Dispense:  30 tablet    Refill:  5  . albuterol (PROVENTIL HFA;VENTOLIN HFA) 108 (90 BASE) MCG/ACT inhaler    Sig: Inhale 1 puff into the lungs every 6 (six) hours as needed.    Dispense:  1 Inhaler    Refill:  1  . metFORMIN (GLUCOPHAGE) 1000 MG tablet    Sig: Take 1 tablet (1,000 mg total) by mouth 2 (two) times daily with a meal.    Dispense:  60 tablet    Refill:  5    Order Specific Question:  Supervising Provider    Answer:  Chipper Herb [[id number]]   Changed metformin from 574m bid to 10067mBID Diet and exercise encouraged Continue all meds Follow up in 3 months Health maintenance reviewed  MaOrangeFNP

## 2013-06-17 ENCOUNTER — Telehealth: Payer: Self-pay | Admitting: Family Medicine

## 2013-06-17 LAB — CMP14+EGFR
A/G RATIO: 1.8 (ref 1.1–2.5)
ALT: 23 IU/L (ref 0–32)
AST: 20 IU/L (ref 0–40)
Albumin: 4.2 g/dL (ref 3.5–5.5)
Alkaline Phosphatase: 82 IU/L (ref 39–117)
BUN / CREAT RATIO: 11 (ref 9–23)
BUN: 10 mg/dL (ref 6–24)
CO2: 28 mmol/L (ref 18–29)
Calcium: 10.1 mg/dL (ref 8.7–10.2)
Chloride: 99 mmol/L (ref 97–108)
Creatinine, Ser: 0.93 mg/dL (ref 0.57–1.00)
GFR calc Af Amer: 79 mL/min/{1.73_m2} (ref >59)
GFR, EST NON AFRICAN AMERICAN: 68 mL/min/{1.73_m2} (ref >59)
Globulin, Total: 2.4 g/dL (ref 1.5–4.5)
Glucose: 226 mg/dL — ABNORMAL HIGH (ref 65–99)
POTASSIUM: 5 mmol/L (ref 3.5–5.2)
SODIUM: 143 mmol/L (ref 134–144)
Total Bilirubin: 0.3 mg/dL (ref 0.0–1.2)
Total Protein: 6.6 g/dL (ref 6.0–8.5)

## 2013-06-17 LAB — NMR, LIPOPROFILE
Cholesterol: 142 mg/dL (ref <200)
HDL CHOLESTEROL BY NMR: 44 mg/dL (ref >=40)
HDL PARTICLE NUMBER: 34.6 umol/L (ref >=30.5)
LDL Particle Number: 961 nmol/L (ref <1000)
LDL Size: 20.2 nm — ABNORMAL LOW (ref >20.5)
LDLC SERPL CALC-MCNC: 58 mg/dL (ref <100)
LP-IR Score: 89 — ABNORMAL HIGH (ref <=45)
Small LDL Particle Number: 611 nmol/L — ABNORMAL HIGH (ref <=527)
Triglycerides by NMR: 202 mg/dL — ABNORMAL HIGH (ref <150)

## 2013-06-17 NOTE — Telephone Encounter (Signed)
Message copied by Waverly Ferrari on Thu Jun 17, 2013 11:42 AM ------      Message from: Chevis Pretty      Created: Thu Jun 17, 2013  9:43 AM       hgba1c discussed at appointment- increased metformin at appointment      Kidney and liver function normal      Cholesterol is improving- Continue current meds and diet and recheck in 3 months ------

## 2013-06-21 NOTE — Telephone Encounter (Signed)
Patient aware of results.

## 2013-08-04 ENCOUNTER — Encounter: Payer: Self-pay | Admitting: *Deleted

## 2013-09-15 ENCOUNTER — Ambulatory Visit: Payer: PRIVATE HEALTH INSURANCE | Admitting: Nurse Practitioner

## 2013-09-16 ENCOUNTER — Ambulatory Visit: Payer: PRIVATE HEALTH INSURANCE | Admitting: Nurse Practitioner

## 2013-10-04 ENCOUNTER — Ambulatory Visit (INDEPENDENT_AMBULATORY_CARE_PROVIDER_SITE_OTHER): Payer: 59 | Admitting: Nurse Practitioner

## 2013-10-04 ENCOUNTER — Encounter: Payer: Self-pay | Admitting: Nurse Practitioner

## 2013-10-04 VITALS — BP 128/75 | HR 96 | Temp 98.5°F | Wt 218.0 lb

## 2013-10-04 DIAGNOSIS — F411 Generalized anxiety disorder: Secondary | ICD-10-CM

## 2013-10-04 DIAGNOSIS — E785 Hyperlipidemia, unspecified: Secondary | ICD-10-CM

## 2013-10-04 DIAGNOSIS — E119 Type 2 diabetes mellitus without complications: Secondary | ICD-10-CM

## 2013-10-04 DIAGNOSIS — I10 Essential (primary) hypertension: Secondary | ICD-10-CM

## 2013-10-04 LAB — POCT GLYCOSYLATED HEMOGLOBIN (HGB A1C)

## 2013-10-04 MED ORDER — GLIMEPIRIDE 4 MG PO TABS: 4.0000 mg | ORAL_TABLET | Freq: Every day | ORAL | Status: DC

## 2013-10-04 NOTE — Progress Notes (Signed)
Subjective:    Patient ID: Annette Gibson, female    DOB: 1955-08-18, 58 y.o.   MRN: [id number]  Patient here today for follow up- No changes since last visit. No complaints today  Diabetes She presents for her follow-up diabetic visit. She has type 2 diabetes mellitus. Her disease course has been stable. There are no hypoglycemic associated symptoms. There are no diabetic associated symptoms. Pertinent negatives for diabetes include no blurred vision, no foot paresthesias, no foot ulcerations, no polydipsia, no polyphagia, no visual change and no weakness. There are no hypoglycemic complications. There are no diabetic complications. Pertinent negatives for diabetic complications include no peripheral neuropathy. Risk factors for coronary artery disease include diabetes mellitus, dyslipidemia, obesity, tobacco exposure and post-menopausal. Current diabetic treatment includes oral agent (monotherapy) (increased metformin to 1015m BID). She is compliant with treatment all of the time. Her weight is stable. She is following a generally healthy diet. She participates in exercise three times a week. An ACE inhibitor/angiotensin II receptor blocker is not being taken. She does not see a podiatrist.Eye exam is not current (Two years ago-Pt encouraged to make appt).  Hyperlipidemia This is a chronic problem. The current episode started more than 1 year ago. The problem is controlled. Recent lipid tests were reviewed and are normal. Exacerbating diseases include diabetes and obesity. She has no history of liver disease. Factors aggravating her hyperlipidemia include smoking. Associated symptoms include shortness of breath. Pertinent negatives include no leg pain or myalgias. Current antihyperlipidemic treatment includes statins. The current treatment provides moderate improvement of lipids. Risk factors for coronary artery disease include diabetes mellitus, dyslipidemia, obesity and post-menopausal.  COPD Pt  takes Breo that seems to work-likes it much better than the advair. Has albuterol inhaler that she uses twice a week-when she is cleaning the house or working in the yard. Peripheral Edema Pt takes lasix daily- Denies any swelling at this time and is working good.   Review of Systems  Eyes: Negative for blurred vision.  Respiratory: Positive for shortness of breath.   Endocrine: Negative for polydipsia and polyphagia.  Musculoskeletal: Negative for myalgias.  Neurological: Negative for weakness.  All other systems reviewed and are negative.      Objective:   Physical Exam  Vitals reviewed. Constitutional: She is oriented to person, place, and time. She appears well-developed and well-nourished.  HENT:  Head: Normocephalic.  Right Ear: External ear normal.  Left Ear: External ear normal.  Nose: Nose normal.  Mouth/Throat: Oropharynx is clear and moist.  Eyes: Conjunctivae and EOM are normal. Pupils are equal, round, and reactive to light.  Neck: Normal range of motion. Neck supple. No thyromegaly present.  Cardiovascular: Normal rate, regular rhythm, normal heart sounds and intact distal pulses.   Pulmonary/Chest: Effort normal. No respiratory distress. She has wheezes.  Dry coarse nonproductive cough   Abdominal: Soft. Bowel sounds are normal. She exhibits no distension. There is no tenderness.  Musculoskeletal: Normal range of motion. She exhibits no edema and no tenderness.  Neurological: She is alert and oriented to person, place, and time.  Skin: Skin is warm and dry. No rash noted. No erythema.  Psychiatric: She has a normal mood and affect. Her behavior is normal. Judgment and thought content normal.   BP 128/75  Pulse 96  Temp(Src) 98.5 F (36.9 C) (Oral)  Wt 218 lb (98.884 kg)    Results for orders placed in visit on 10/04/13  POCT GLYCOSYLATED HEMOGLOBIN (HGB A1C)      Result  Value Ref Range   Hemoglobin A1C 9.5%        Assessment & Plan:   1. Type II or  unspecified type diabetes mellitus without mention of complication, not stated as uncontrolled   2. Hypertension   3. Hyperlipidemia   4. GAD (generalized anxiety disorder)    Orders Placed This Encounter  Procedures  . CMP14+EGFR  . NMR, lipoprofile  . POCT glycosylated hemoglobin (Hb A1C)   Meds ordered this encounter  Medications  . glimepiride (AMARYL) 4 MG tablet    Sig: Take 1 tablet (4 mg total) by mouth daily before breakfast.    Dispense:  30 tablet    Refill:  3    Order Specific Question:  Supervising Provider    Answer:  Joycelyn Man   Added amaryl to meds Daily fasting blood sugars Labs pending Health maintenance reviewed Diet and exercise encouraged Continue all meds Follow up  In 3 months   [place], FNP

## 2013-10-04 NOTE — Patient Instructions (Signed)
Basic Carbohydrate Counting Basic carbohydrate counting is a way to plan meals. It is done by counting the amount of carbohydrate in foods. Foods that have carbohydrates are starches (grains, beans, starchy vegetables) and sweets. Eating carbohydrates increases blood glucose (sugar) levels. People with diabetes use carbohydrate counting to help keep their blood glucose at a normal level.  COUNTING CARBOHYDRATES IN FOODS The first step in counting carbohydrates is to learn how many carbohydrate servings you should have in every meal. A dietitian can plan this for you. After learning the amount of carbohydrates to include in your meal plan, you can start to choose the carbohydrate-containing foods you want to eat.  There are 2 ways to identify the amount of carbohydrates in the foods you eat.  Read the Nutrition Facts panel on food labels. You need 2 pieces of information from the Nutrition Facts panel to count carbohydrates this way:  Serving size.  Total carbohydrate (in grams). Decide how many servings you will be eating. If it is 1 serving, you will be eating the amount of carbohydrate listed on the panel. If you will be eating 2 servings, you will be eating double the amount of carbohydrate listed on the panel.   Learn serving sizes. A serving size of most carbohydrate-containing foods is about 15 grams (g). Listed below are single serving sizes of common carbohydrate-containing foods:  1 slice bread.   cup unsweetened, dry cereal.   cup hot cereal.   cup rice.   cup mashed potatoes.   cup pasta.  1 cup fresh fruit.   cup canned fruit.  1 cup milk (whole, 2%, or skim).   cup starchy vegetables (peas, corn, or potatoes). Counting carbohydrates this way is similar to looking on the Nutrition Facts panel. Decide how many servings you will eat first. Multiply the number of servings you eat by 15 g. For example, if you have 2 cups of strawberries, you had 2 servings. That means  you had 30 g of carbohydrate (2 servings x 15 g = 30 g). CALCULATING CARBOHYDRATES IN A MEAL Sample dinner  3 oz chicken breast.   cup brown rice.   cup corn.  1 cup fat-free milk.  1 cup strawberries with sugar-free whipped topping. Carbohydrate calculation First, identify the foods that contain carbohydrate:  Rice.  Corn.  Milk.  Strawberries. Calculate the number of servings eaten:  2 servings rice.  1 serving corn.  1 serving milk.  1 serving strawberries. Multiply the number of servings by 15 g:  2 servings rice x 15 g = 30 g.  1 serving corn x 15 g = 15 g.  1 serving milk x 15 g = 15 g.  1 serving strawberries x 15 g = 15 g. Add the amounts to find the total carbohydrates eaten: 30 g + 15 g + 15 g + 15 g = 75 g carbohydrate eaten at dinner. Document Released: 05/20/2005 Document Revised: 08/12/2011 Document Reviewed: 04/05/2011 [facility] Patient Information 2014 [place], [place].

## 2013-10-06 LAB — NMR, LIPOPROFILE
Cholesterol: 185 mg/dL (ref <200)
HDL Cholesterol by NMR: 41 mg/dL (ref >=40)
HDL Particle Number: 37.7 umol/L (ref >=30.5)
LDL Particle Number: 1372 nmol/L — ABNORMAL HIGH (ref <1000)
LDL Size: 19.6 nm (ref >20.5)
LDLC SERPL CALC-MCNC: 67 mg/dL (ref <100)
LP-IR Score: 80 — ABNORMAL HIGH (ref <=45)
Small LDL Particle Number: 1151 nmol/L — ABNORMAL HIGH (ref <=527)
Triglycerides by NMR: 387 mg/dL — ABNORMAL HIGH (ref <150)

## 2013-10-06 LAB — CMP14+EGFR
ALT: 30 IU/L (ref 0–32)
AST: 25 IU/L (ref 0–40)
Albumin/Globulin Ratio: 1.7 (ref 1.1–2.5)
Albumin: 4 g/dL (ref 3.5–5.5)
Alkaline Phosphatase: 84 IU/L (ref 39–117)
BUN/Creatinine Ratio: 13 (ref 9–23)
BUN: 10 mg/dL (ref 6–24)
CALCIUM: 9.8 mg/dL (ref 8.7–10.2)
CO2: 25 mmol/L (ref 18–29)
CREATININE: 0.75 mg/dL (ref 0.57–1.00)
Chloride: 98 mmol/L (ref 97–108)
GFR calc Af Amer: 102 mL/min/{1.73_m2} (ref >59)
GFR, EST NON AFRICAN AMERICAN: 89 mL/min/{1.73_m2} (ref >59)
GLOBULIN, TOTAL: 2.4 g/dL (ref 1.5–4.5)
Glucose: 279 mg/dL — ABNORMAL HIGH (ref 65–99)
POTASSIUM: 4.5 mmol/L (ref 3.5–5.2)
Sodium: 140 mmol/L (ref 134–144)
Total Bilirubin: 0.2 mg/dL (ref 0.0–1.2)
Total Protein: 6.4 g/dL (ref 6.0–8.5)

## 2013-10-18 ENCOUNTER — Other Ambulatory Visit: Payer: Self-pay | Admitting: *Deleted

## 2013-10-18 MED ORDER — GLUCOSE BLOOD VI STRP: ORAL_STRIP | Status: DC

## 2014-01-07 ENCOUNTER — Encounter: Payer: Self-pay | Admitting: Nurse Practitioner

## 2014-01-07 ENCOUNTER — Ambulatory Visit (INDEPENDENT_AMBULATORY_CARE_PROVIDER_SITE_OTHER): Payer: 59 | Admitting: Nurse Practitioner

## 2014-01-07 VITALS — BP 128/70 | HR 91 | Temp 98.7°F | Ht 65.0 in | Wt 218.6 lb

## 2014-01-07 DIAGNOSIS — F411 Generalized anxiety disorder: Secondary | ICD-10-CM

## 2014-01-07 DIAGNOSIS — M8580 Other specified disorders of bone density and structure, unspecified site: Secondary | ICD-10-CM

## 2014-01-07 DIAGNOSIS — Z6836 Body mass index (BMI) 36.0-36.9, adult: Secondary | ICD-10-CM

## 2014-01-07 DIAGNOSIS — J441 Chronic obstructive pulmonary disease with (acute) exacerbation: Secondary | ICD-10-CM

## 2014-01-07 DIAGNOSIS — I159 Secondary hypertension, unspecified: Secondary | ICD-10-CM

## 2014-01-07 DIAGNOSIS — E559 Vitamin D deficiency, unspecified: Secondary | ICD-10-CM

## 2014-01-07 DIAGNOSIS — I158 Other secondary hypertension: Secondary | ICD-10-CM

## 2014-01-07 DIAGNOSIS — E785 Hyperlipidemia, unspecified: Secondary | ICD-10-CM

## 2014-01-07 DIAGNOSIS — E1165 Type 2 diabetes mellitus with hyperglycemia: Secondary | ICD-10-CM

## 2014-01-07 DIAGNOSIS — Z713 Dietary counseling and surveillance: Secondary | ICD-10-CM

## 2014-01-07 DIAGNOSIS — M949 Disorder of cartilage, unspecified: Secondary | ICD-10-CM

## 2014-01-07 DIAGNOSIS — R609 Edema, unspecified: Secondary | ICD-10-CM

## 2014-01-07 DIAGNOSIS — IMO0001 Reserved for inherently not codable concepts without codable children: Secondary | ICD-10-CM

## 2014-01-07 DIAGNOSIS — M899 Disorder of bone, unspecified: Secondary | ICD-10-CM

## 2014-01-07 LAB — POCT GLYCOSYLATED HEMOGLOBIN (HGB A1C): Hemoglobin A1C: 6.6

## 2014-01-07 MED ORDER — FENOFIBRATE 160 MG PO TABS: 160.0000 mg | ORAL_TABLET | Freq: Every day | ORAL | Status: DC

## 2014-01-07 MED ORDER — ATORVASTATIN CALCIUM 40 MG PO TABS: 40.0000 mg | ORAL_TABLET | Freq: Every day | ORAL | Status: DC

## 2014-01-07 MED ORDER — METFORMIN HCL 1000 MG PO TABS: 1000.0000 mg | ORAL_TABLET | Freq: Two times a day (BID) | ORAL | Status: DC

## 2014-01-07 MED ORDER — GLIMEPIRIDE 4 MG PO TABS: 4.0000 mg | ORAL_TABLET | Freq: Every day | ORAL | Status: DC

## 2014-01-07 MED ORDER — FUROSEMIDE 20 MG PO TABS: 20.0000 mg | ORAL_TABLET | Freq: Every day | ORAL | Status: DC

## 2014-01-07 MED ORDER — FLUTICASONE FUROATE-VILANTEROL 100-25 MCG/INH IN AEPB: 1.0000 | INHALATION_SPRAY | RESPIRATORY_TRACT | Status: DC | PRN

## 2014-01-07 NOTE — Patient Instructions (Signed)
Diabetes and Foot Care Diabetes may cause you to have problems because of poor blood supply (circulation) to your feet and legs. This may cause the skin on your feet to become thinner, break easier, and heal more slowly. Your skin may become dry, and the skin may peel and crack. You may also have nerve damage in your legs and feet causing decreased feeling in them. You may not notice minor injuries to your feet that could lead to infections or more serious problems. Taking care of your feet is one of the most important things you can do for yourself.  HOME CARE INSTRUCTIONS  Wear shoes at all times, even in the house. Do not go barefoot. Bare feet are easily injured.  Check your feet daily for blisters, cuts, and redness. If you cannot see the bottom of your feet, use a mirror or ask someone for help.  Wash your feet with warm water (do not use hot water) and mild soap. Then pat your feet and the areas between your toes until they are completely dry. Do not soak your feet as this can dry your skin.  Apply a moisturizing lotion or petroleum jelly (that does not contain alcohol and is unscented) to the skin on your feet and to dry, brittle toenails. Do not apply lotion between your toes.  Trim your toenails straight across. Do not dig under them or around the cuticle. File the edges of your nails with an emery board or nail file.  Do not cut corns or calluses or try to remove them with medicine.  Wear clean socks or stockings every day. Make sure they are not too tight. Do not wear knee-high stockings since they may decrease blood flow to your legs.  Wear shoes that fit properly and have enough cushioning. To break in new shoes, wear them for just a few hours a day. This prevents you from injuring your feet. Always look in your shoes before you put them on to be sure there are no objects inside.  Do not cross your legs. This may decrease the blood flow to your feet.  If you find a minor scrape,  cut, or break in the skin on your feet, keep it and the skin around it clean and dry. These areas may be cleansed with mild soap and water. Do not cleanse the area with peroxide, alcohol, or iodine.  When you remove an adhesive bandage, be sure not to damage the skin around it.  If you have a wound, look at it several times a day to make sure it is healing.  Do not use heating pads or hot water bottles. They may burn your skin. If you have lost feeling in your feet or legs, you may not know it is happening until it is too late.  Make sure your health care provider performs a complete foot exam at least annually or more often if you have foot problems. Report any cuts, sores, or bruises to your health care provider immediately. SEEK MEDICAL CARE IF:   You have an injury that is not healing.  You have cuts or breaks in the skin.  You have an ingrown nail.  You notice redness on your legs or feet.  You feel burning or tingling in your legs or feet.  You have pain or cramps in your legs and feet.  Your legs or feet are numb.  Your feet always feel cold. SEEK IMMEDIATE MEDICAL CARE IF:   There is increasing redness,   swelling, or pain in or around a wound.  There is a red line that goes up your leg.  Pus is coming from a wound.  You develop a fever or as directed by your health care provider.  You notice a bad smell coming from an ulcer or wound. Document Released: 05/17/2000 Document Revised: 01/20/2013 Document Reviewed: 10/27/2012 [employer] Patient Information 2015 [employer]. This information is not intended to replace advice given to you by your health care provider. Make sure you discuss any questions you have with your health care provider.  

## 2014-01-07 NOTE — Progress Notes (Signed)
Subjective:    Patient ID: Annette Gibson, female    DOB: 10-31-1955, 58 y.o.   MRN: [id number]  Patient here today for follow up- No changes since last visit. No complaints today  Diabetes She presents for her follow-up diabetic visit. She has type 2 diabetes mellitus. Her disease course has been stable. There are no hypoglycemic associated symptoms. There are no diabetic associated symptoms. Pertinent negatives for diabetes include no blurred vision, no foot paresthesias, no foot ulcerations, no polydipsia, no polyphagia, no visual change and no weakness. There are no hypoglycemic complications. There are no diabetic complications. Pertinent negatives for diabetic complications include no peripheral neuropathy. Risk factors for coronary artery disease include diabetes mellitus, dyslipidemia, obesity, tobacco exposure and post-menopausal. Current diabetic treatment includes oral agent (monotherapy) (increased metformin to 1022m BID). She is compliant with treatment all of the time. Her weight is stable. She is following a generally healthy diet. She participates in exercise three times a week. An ACE inhibitor/angiotensin II receptor blocker is not being taken. She does not see a podiatrist.Eye exam is not current (Two years ago-Pt encouraged to make appt).  Hyperlipidemia This is a chronic problem. The current episode started more than 1 year ago. The problem is controlled. Recent lipid tests were reviewed and are normal. Exacerbating diseases include diabetes and obesity. She has no history of liver disease. Factors aggravating her hyperlipidemia include smoking. Associated symptoms include shortness of breath. Pertinent negatives include no leg pain or myalgias. Current antihyperlipidemic treatment includes statins. The current treatment provides moderate improvement of lipids. Risk factors for coronary artery disease include diabetes mellitus, dyslipidemia, obesity and post-menopausal.  COPD Pt  takes Breo that seems to work-likes it much better than the advair. Has albuterol inhaler that she uses twice a week-when she is cleaning the house or working in the yard. Peripheral Edema Pt takes lasix daily- Denies any swelling at this time and is working good. osteopenia Just doing weight bearing exercises GAD Curreently not taking anything- is trying stress management     Review of Systems  Eyes: Negative for blurred vision.  Respiratory: Positive for shortness of breath.   Endocrine: Negative for polydipsia and polyphagia.  Musculoskeletal: Negative for myalgias.  Neurological: Negative for weakness.  All other systems reviewed and are negative.      Objective:   Physical Exam  Vitals reviewed. Constitutional: She is oriented to person, place, and time. She appears well-developed and well-nourished.  HENT:  Head: Normocephalic.  Right Ear: External ear normal.  Left Ear: External ear normal.  Nose: Nose normal.  Mouth/Throat: Oropharynx is clear and moist.  Eyes: Conjunctivae and EOM are normal. Pupils are equal, round, and reactive to light.  Neck: Normal range of motion. Neck supple. No thyromegaly present.  Cardiovascular: Normal rate, regular rhythm, normal heart sounds and intact distal pulses.   Pulmonary/Chest: Effort normal. No respiratory distress. She has wheezes.  Dry coarse nonproductive cough   Abdominal: Soft. Bowel sounds are normal. She exhibits no distension. There is no tenderness.  Musculoskeletal: Normal range of motion. She exhibits no edema and no tenderness.  Neurological: She is alert and oriented to person, place, and time.  Skin: Skin is warm and dry. No rash noted. No erythema.  Psychiatric: She has a normal mood and affect. Her behavior is normal. Judgment and thought content normal.   BP 128/70  Pulse 91  Temp(Src) 98.7 F (37.1 C) (Oral)  Ht _0  (1.651 m)  Wt 218 lb 9.6 oz (  99.156 kg)  BMI 36.38 kg/m2    Results for orders  placed in visit on 01/07/14  POCT GLYCOSYLATED HEMOGLOBIN (HGB A1C)      Result Value Ref Range   Hemoglobin A1C 6.6%         Assessment & Plan:    1. Hyperlipidemia   2. Secondary hypertension, unspecified   3. Type II or unspecified type diabetes mellitus without mention of complication, uncontrolled   4. Unspecified vitamin D deficiency   5. Peripheral edema   6. Osteopenia   7. COPD exacerbation   8. GAD (generalized anxiety disorder)   9. BMI 36.0-36.9,adult   10. Weight loss counseling, encounter for     Orders Placed This Encounter  Procedures  . CMP14+EGFR  . NMR, lipoprofile  . POCT glycosylated hemoglobin (Hb A1C)   Meds ordered this encounter  Medications  . glimepiride (AMARYL) 4 MG tablet    Sig: Take 1 tablet (4 mg total) by mouth daily before breakfast.    Dispense:  30 tablet    Refill:  5    Order Specific Question:  Supervising Provider    Answer:  Chipper Herb [[id number]]  . metFORMIN (GLUCOPHAGE) 1000 MG tablet    Sig: Take 1 tablet (1,000 mg total) by mouth 2 (two) times daily with a meal.    Dispense:  60 tablet    Refill:  5    Order Specific Question:  Supervising Provider    Answer:  Chipper Herb [[id number]]  . furosemide (LASIX) 20 MG tablet    Sig: Take 1 tablet (20 mg total) by mouth daily.    Dispense:  30 tablet    Refill:  5    Order Specific Question:  Supervising Provider    Answer:  Chipper Herb [[id number]]  . Fluticasone Furoate-Vilanterol (BREO ELLIPTA) 100-25 MCG/INH AEPB    Sig: Inhale 1 puff into the lungs as needed.    Dispense:  60 each    Refill:  5    Order Specific Question:  Supervising Provider    Answer:  Chipper Herb [[id number]]  . fenofibrate 160 MG tablet    Sig: Take 1 tablet (160 mg total) by mouth daily.    Dispense:  30 tablet    Refill:  5    Order Specific Question:  Supervising Provider    Answer:  Chipper Herb [[id number]]  . atorvastatin (LIPITOR) 40 MG tablet    Sig: Take 1 tablet (40 mg total) by mouth  daily at 6 PM.    Dispense:  30 tablet    Refill:  5    Order Specific Question:  Supervising Provider    Answer:  Joycelyn Man  Smoking cessation Refuses colonoscopy and mammogram Refuses all immunizations Labs pending Health maintenance reviewed Diet and exercise encouraged Continue all meds Follow up  In 3 months   [place], FNP

## 2014-01-08 ENCOUNTER — Encounter: Payer: Self-pay | Admitting: Family Medicine

## 2014-01-08 LAB — NMR, LIPOPROFILE
CHOLESTEROL: 138 mg/dL (ref 100–199)
HDL Cholesterol by NMR: 42 mg/dL (ref >39)
HDL Particle Number: 36.9 umol/L (ref >=30.5)
LDL Particle Number: 848 nmol/L (ref <1000)
LDL SIZE: 20.2 nm (ref >20.5)
LDLC SERPL CALC-MCNC: 56 mg/dL (ref 0–99)
LP-IR Score: 90 — ABNORMAL HIGH (ref <=45)
SMALL LDL PARTICLE NUMBER: 516 nmol/L (ref <=527)
TRIGLYCERIDES BY NMR: 200 mg/dL — AB (ref 0–149)

## 2014-01-08 LAB — CMP14+EGFR
A/G RATIO: 2 (ref 1.1–2.5)
ALBUMIN: 4.1 g/dL (ref 3.5–5.5)
ALT: 25 IU/L (ref 0–32)
AST: 20 IU/L (ref 0–40)
Alkaline Phosphatase: 58 IU/L (ref 39–117)
BILIRUBIN TOTAL: 0.3 mg/dL (ref 0.0–1.2)
BUN/Creatinine Ratio: 11 (ref 9–23)
BUN: 11 mg/dL (ref 6–24)
CO2: 28 mmol/L (ref 18–29)
Calcium: 10.1 mg/dL (ref 8.7–10.2)
Chloride: 100 mmol/L (ref 97–108)
Creatinine, Ser: 0.96 mg/dL (ref 0.57–1.00)
GFR calc Af Amer: 76 mL/min/{1.73_m2} (ref >59)
GFR, EST NON AFRICAN AMERICAN: 66 mL/min/{1.73_m2} (ref >59)
GLUCOSE: 142 mg/dL — AB (ref 65–99)
Globulin, Total: 2.1 g/dL (ref 1.5–4.5)
POTASSIUM: 4.4 mmol/L (ref 3.5–5.2)
Sodium: 143 mmol/L (ref 134–144)
TOTAL PROTEIN: 6.2 g/dL (ref 6.0–8.5)

## 2014-04-12 ENCOUNTER — Other Ambulatory Visit: Payer: Self-pay | Admitting: Family Medicine

## 2014-04-12 DIAGNOSIS — E119 Type 2 diabetes mellitus without complications: Secondary | ICD-10-CM

## 2014-04-14 ENCOUNTER — Encounter: Payer: Self-pay | Admitting: Nurse Practitioner

## 2014-04-14 ENCOUNTER — Other Ambulatory Visit: Payer: Self-pay | Admitting: *Deleted

## 2014-04-14 ENCOUNTER — Ambulatory Visit (INDEPENDENT_AMBULATORY_CARE_PROVIDER_SITE_OTHER): Payer: 59 | Admitting: Nurse Practitioner

## 2014-04-14 VITALS — BP 141/78 | HR 100 | Temp 98.4°F | Ht 65.0 in | Wt 223.2 lb

## 2014-04-14 DIAGNOSIS — Z23 Encounter for immunization: Secondary | ICD-10-CM

## 2014-04-14 DIAGNOSIS — I159 Secondary hypertension, unspecified: Secondary | ICD-10-CM

## 2014-04-14 DIAGNOSIS — F411 Generalized anxiety disorder: Secondary | ICD-10-CM

## 2014-04-14 DIAGNOSIS — E119 Type 2 diabetes mellitus without complications: Secondary | ICD-10-CM

## 2014-04-14 DIAGNOSIS — R609 Edema, unspecified: Secondary | ICD-10-CM

## 2014-04-14 DIAGNOSIS — IMO0001 Reserved for inherently not codable concepts without codable children: Secondary | ICD-10-CM

## 2014-04-14 DIAGNOSIS — M8580 Other specified disorders of bone density and structure, unspecified site: Secondary | ICD-10-CM

## 2014-04-14 DIAGNOSIS — J449 Chronic obstructive pulmonary disease, unspecified: Secondary | ICD-10-CM

## 2014-04-14 DIAGNOSIS — E785 Hyperlipidemia, unspecified: Secondary | ICD-10-CM

## 2014-04-14 DIAGNOSIS — E559 Vitamin D deficiency, unspecified: Secondary | ICD-10-CM

## 2014-04-14 LAB — POCT GLYCOSYLATED HEMOGLOBIN (HGB A1C): HEMOGLOBIN A1C: 7.6

## 2014-04-14 NOTE — Patient Instructions (Signed)

## 2014-04-14 NOTE — Progress Notes (Signed)
Subjective:    Patient ID: Annette Gibson, female    DOB: 05-18-1956, 58 y.o.   MRN: [id number]  Patient here today for follow up- No changes since last visit. No complaints today  Diabetes She presents for her follow-up diabetic visit. She has type 2 diabetes mellitus. No MedicAlert identification noted. Her disease course has been stable. Pertinent negatives for diabetes include no polydipsia, no polyphagia, no visual change and no weakness. Symptoms are stable. Risk factors for coronary artery disease include dyslipidemia, family history, diabetes mellitus, hypertension, obesity and post-menopausal. Current diabetic treatment includes oral agent (dual therapy). Her weight is stable. When asked about meal planning, she reported none. She has not had a previous visit with a dietitian. She rarely participates in exercise. Her breakfast blood glucose is taken between 8-9 am. Her breakfast blood glucose range is generally 110-130 mg/dl. Her overall blood glucose range is 110-130 mg/dl. An ACE inhibitor/angiotensin II receptor blocker is not being taken. She does not see a podiatrist.Eye exam is not current.  Hyperlipidemia This is a chronic problem. The current episode started more than 1 year ago. The problem is uncontrolled. Recent lipid tests were reviewed and are high. Exacerbating diseases include diabetes and obesity. She has no history of hypothyroidism. Associated symptoms include shortness of breath. Pertinent negatives include no myalgias. Current antihyperlipidemic treatment includes statins. The current treatment provides moderate improvement of lipids. Compliance problems include adherence to diet and adherence to exercise.  Risk factors for coronary artery disease include diabetes mellitus, dyslipidemia, obesity and post-menopausal.  Hypertension This is a chronic problem. The current episode started more than 1 year ago. The problem is unchanged. The problem is controlled. Associated  symptoms include shortness of breath. Risk factors for coronary artery disease include obesity, dyslipidemia, family history and post-menopausal state. Past treatments include diuretics. The current treatment provides moderate improvement. Compliance problems include diet and exercise.   COPD Pt takes Breo that seems to work-likes it much better than the advair. Has albuterol inhaler that she uses twice a week-when she is cleaning the house or working in the yard. Peripheral Edema Pt takes lasix daily- Denies any swelling at this time and is working good. osteopenia Just doing weight bearing exercises GAD Curreently not taking anything- is trying stress management     Review of Systems  Respiratory: Positive for shortness of breath.   Endocrine: Negative for polydipsia and polyphagia.  Musculoskeletal: Negative for myalgias.  Neurological: Negative for weakness.  All other systems reviewed and are negative.      Objective:   Physical Exam  Constitutional: She is oriented to person, place, and time. She appears well-developed and well-nourished.  HENT:  Nose: Nose normal.  Mouth/Throat: Oropharynx is clear and moist.  Eyes: EOM are normal.  Neck: Trachea normal, normal range of motion and full passive range of motion without pain. Neck supple. No JVD present. Carotid bruit is not present. No thyromegaly present.  Cardiovascular: Normal rate, regular rhythm, normal heart sounds and intact distal pulses.  Exam reveals no gallop and no friction rub.   No murmur heard. Pulmonary/Chest: Effort normal and breath sounds normal.  Abdominal: Soft. Bowel sounds are normal. She exhibits no distension and no mass. There is no tenderness.  Musculoskeletal: Normal range of motion. She exhibits no edema.  Lymphadenopathy:    She has no cervical adenopathy.  Neurological: She is alert and oriented to person, place, and time. She has normal reflexes.  Skin: Skin is warm and dry.  Psychiatric:  She has a normal mood and affect. Her behavior is normal. Judgment and thought content normal.   BP 141/78 mmHg  Pulse 100  Temp(Src) 98.4 F (36.9 C) (Oral)  Ht _0  (1.651 m)  Wt 223 lb 3.2 oz (101.243 kg)  BMI 37.14 kg/m2    Results for orders placed or performed in visit on 04/14/14  POCT glycosylated hemoglobin (Hb A1C)  Result Value Ref Range   Hemoglobin A1C 7.6        Assessment & Plan:   1. Hyperlipidemia Low fat diet - NMR, lipoprofile  2. Secondary hypertension, unspecified Avoid na in diet - CMP14+EGFR  3. Type 2 diabetes mellitus without complication Stricter cwarb counting If  No better at next visit will have to add or change meds - POCT glycosylated hemoglobin (Hb A1C) - POCT UA - Microalbumin  4. COPD bronchitis Avoid allergens STop smoking  5. Osteopenia Weight bearing exercises  6. Peripheral edema elevat  Legs when sitting  7. GAD (generalized anxiety disorder) Stress management  8. Vitamin D deficiency   hemoccult cards given to patient with directions Labs pending Health maintenance reviewed Diet and exercise encouraged Continue all meds Follow up  In 3 months   [place], FNP

## 2014-04-15 LAB — NMR, LIPOPROFILE
Cholesterol: 134 mg/dL (ref 100–199)
HDL CHOLESTEROL BY NMR: 39 mg/dL — AB (ref >39)
HDL Particle Number: 34.6 umol/L (ref >=30.5)
LDL Particle Number: 1037 nmol/L — ABNORMAL HIGH (ref <1000)
LDL SIZE: 19.9 nm (ref >20.5)
LDL-C: 42 mg/dL (ref 0–99)
LP-IR Score: 93 — ABNORMAL HIGH (ref <=45)
SMALL LDL PARTICLE NUMBER: 701 nmol/L — AB (ref <=527)
Triglycerides by NMR: 263 mg/dL — ABNORMAL HIGH (ref 0–149)

## 2014-04-15 LAB — CMP14+EGFR
ALT: 30 IU/L (ref 0–32)
AST: 20 IU/L (ref 0–40)
Albumin/Globulin Ratio: 1.8 (ref 1.1–2.5)
Albumin: 4.1 g/dL (ref 3.5–5.5)
Alkaline Phosphatase: 75 IU/L (ref 39–117)
BUN/Creatinine Ratio: 16 (ref 9–23)
BUN: 14 mg/dL (ref 6–24)
CO2: 28 mmol/L (ref 18–29)
Calcium: 9.8 mg/dL (ref 8.7–10.2)
Chloride: 98 mmol/L (ref 97–108)
Creatinine, Ser: 0.87 mg/dL (ref 0.57–1.00)
GFR calc Af Amer: 85 mL/min/{1.73_m2} (ref >59)
GFR calc non Af Amer: 74 mL/min/{1.73_m2} (ref >59)
Globulin, Total: 2.3 g/dL (ref 1.5–4.5)
Glucose: 173 mg/dL — ABNORMAL HIGH (ref 65–99)
POTASSIUM: 4.4 mmol/L (ref 3.5–5.2)
SODIUM: 140 mmol/L (ref 134–144)
Total Bilirubin: 0.3 mg/dL (ref 0.0–1.2)
Total Protein: 6.4 g/dL (ref 6.0–8.5)

## 2014-04-20 ENCOUNTER — Telehealth: Payer: Self-pay | Admitting: *Deleted

## 2014-04-20 NOTE — Telephone Encounter (Signed)
Encounter opened in error.

## 2014-04-20 NOTE — Telephone Encounter (Signed)
-----  Message from [facility], [place] sent at 04/20/2014 12:37 PM EST ----- Hgba1c discussed at appointment- changes made at appointment Kidney and liver function stable Cholesterol LDL look good but trig are elevated- should improve with better diabatic control- if no better next visit will need to add fenofibrate Continue current meds- low fat diet and exercise and recheck in 3 months

## 2014-04-20 NOTE — Telephone Encounter (Signed)
Spoke with pt, gave her lab results and recommendations from Eulis Canner, [place].  Pt expressed understanding.

## 2014-05-06 ENCOUNTER — Ambulatory Visit (INDEPENDENT_AMBULATORY_CARE_PROVIDER_SITE_OTHER): Payer: 59 | Admitting: Internal Medicine

## 2014-05-06 ENCOUNTER — Ambulatory Visit (INDEPENDENT_AMBULATORY_CARE_PROVIDER_SITE_OTHER): Payer: 59

## 2014-05-06 VITALS — BP 158/76 | HR 92 | Temp 98.8°F | Resp 20 | Ht 64.5 in | Wt 232.2 lb

## 2014-05-06 DIAGNOSIS — J441 Chronic obstructive pulmonary disease with (acute) exacerbation: Secondary | ICD-10-CM | POA: Diagnosis not present

## 2014-05-06 DIAGNOSIS — R609 Edema, unspecified: Secondary | ICD-10-CM

## 2014-05-06 DIAGNOSIS — J159 Unspecified bacterial pneumonia: Secondary | ICD-10-CM

## 2014-05-06 DIAGNOSIS — R0602 Shortness of breath: Secondary | ICD-10-CM

## 2014-05-06 DIAGNOSIS — R062 Wheezing: Secondary | ICD-10-CM

## 2014-05-06 LAB — POCT CBC
Granulocyte percent: 63.1 % (ref 37–80)
HCT, POC: 43.5 % (ref 37.7–47.9)
Hemoglobin: 13.9 g/dL (ref 12.2–16.2)
Lymph, poc: 5.7 — AB (ref 0.6–3.4)
MCH, POC: 29.7 pg (ref 27–31.2)
MCHC: 32 g/dL (ref 31.8–35.4)
MCV: 93 fL (ref 80–97)
MID (cbc): 0.9 (ref 0–0.9)
MPV: 7.8 fL (ref 0–99.8)
POC Granulocyte: 11.3 — AB (ref 2–6.9)
POC LYMPH PERCENT: 31.9 % (ref 10–50)
POC MID %: 5 % (ref 0–12)
Platelet Count, POC: 395 K/uL (ref 142–424)
RBC: 4.68 M/uL (ref 4.04–5.48)
RDW, POC: 14.9 %
WBC: 17.9 K/uL — AB (ref 4.6–10.2)

## 2014-05-06 MED ORDER — IPRATROPIUM BROMIDE 0.02 % IN SOLN: 0.5000 mg | Freq: Once | RESPIRATORY_TRACT | Status: DC

## 2014-05-06 MED ORDER — GUAIFENESIN ER 1200 MG PO TB12
1.0000 | ORAL_TABLET | Freq: Two times a day (BID) | ORAL | Status: AC
Start: 2014-05-06 — End: 2014-05-13

## 2014-05-06 MED ORDER — LEVOFLOXACIN 500 MG PO TABS: 500.0000 mg | ORAL_TABLET | Freq: Every day | ORAL | Status: DC

## 2014-05-06 MED ORDER — ALBUTEROL SULFATE (2.5 MG/3ML) 0.083% IN NEBU: 2.5000 mg | INHALATION_SOLUTION | Freq: Once | RESPIRATORY_TRACT | Status: DC

## 2014-05-06 MED ORDER — HYDROCOD POLST-CHLORPHEN POLST 10-8 MG/5ML PO LQCR: 5.0000 mL | Freq: Two times a day (BID) | ORAL | Status: DC | PRN

## 2014-05-06 NOTE — Patient Instructions (Signed)
Increase your Lasix to 1 pill 2x/day.  Make an appt on Tues- Wed with your PCP or recheck with [place].  I am here Tuesday or Dr Laney Pastor comes in on Monday at 4 pm and you can see him for f/u.

## 2014-05-06 NOTE — Progress Notes (Signed)
Subjective:    Patient ID: Annette Gibson, female    DOB: [birth year], 58 y.o.   MRN: [id number]  HPI Pt presents to clinic with cough and SOB over the last 2 weeks.  She has known COPD and takes her medications regularly - she has not used her albuterol today but she has used multiple times over the last couple of days.  She started with this illness about 2 weeks ago with green sputum and cough - now she has a dry cough but she has some pain in her left lower lung/back only when she coughs along with the SOB.  She has had no fevers or chills and has only minor sinus congestion.  She has used no OTC medications.  She has normal swelling in her legs with the right worse than the left but over the last week the swelling is worse than normal and does not seem to be decreasing at night like it used to.  She states that the swelling is only about 50% resolved in the am over the last week and slowly gets worse as her day goes on.  She works 3rd shift from 7p to [place].  She takes her current lasix dose when she arrives at work.   Review of Systems  Constitutional: Negative for fever and chills.  HENT: Positive for congestion and rhinorrhea.   Respiratory: Positive for cough, shortness of breath and wheezing.   Cardiovascular: Positive for leg swelling.       Objective:   Physical Exam  Constitutional: She is oriented to person, place, and time. She appears well-developed and well-nourished.  BP 158/76 mmHg  Pulse 92  Temp(Src) 98.8 F (37.1 C) (Oral)  Resp 20  Ht 5' 4.5" (1.638 m)  Wt 232 lb 3.2 oz (105.325 kg)  BMI 39.26 kg/m2  SpO2 84%  PF 220 L/min   HENT:  Head: Normocephalic and atraumatic.  Right Ear: External ear normal.  Left Ear: External ear normal.  Eyes: Conjunctivae are normal.  Neck: Normal range of motion.  Cardiovascular: Normal rate, regular rhythm, normal heart sounds and intact distal pulses.   No murmur heard. Pulmonary/Chest: Effort normal.  Decreased breath  sounds throughout prior to the neb.  After the neb increased air movement and increased wheezing throughout all lung fields.  Musculoskeletal:       Right lower leg: She exhibits edema.       Left lower leg: She exhibits edema.  Bilateral lower leg edema - 2-3+ pitting edema from ankle to just below the knee  Lymphadenopathy:    She has no cervical adenopathy.  Neurological: She is alert and oriented to person, place, and time.  Skin: Skin is warm and dry.  Psychiatric: She has a normal mood and affect. Her behavior is normal. Judgment and thought content normal.   Neb of albuterol and atrovent - pt feels less SOB - wheezing increases but there is more air movement.  [facility] reading (PRIMARY) by  Dr. Laney Pastor.  L lingular infiltrate.     Assessment & Plan:  COPD exacerbation - Plan: DG Chest 2 View, COMPLETE METABOLIC PANEL WITH GFR, POCT CBC  SOB (shortness of breath) - Plan: Brain natriuretic peptide, COMPLETE METABOLIC PANEL WITH GFR, POCT CBC  Wheezing - Plan: albuterol (PROVENTIL) (2.5 MG/3ML) 0.083% nebulizer solution 2.5 mg, ipratropium (ATROVENT) nebulizer solution 0.5 mg, COMPLETE METABOLIC PANEL WITH GFR  Peripheral edema - Plan: Brain natriuretic peptide, COMPLETE METABOLIC PANEL WITH GFR, POCT CBC  Bacterial pneumonia -  Plan: levofloxacin (LEVAQUIN) 500 MG tablet, Guaifenesin (MUCINEX MAXIMUM STRENGTH) 1200 MG TB12, chlorpheniramine-HYDROcodone (TUSSIONEX PENNKINETIC ER) 10-8 MG/5ML LQCR  Pt is most likely having a COPD exacerbation but with the recent change in her LE swelling (she does not believe she has ever had a full work-up to look at the veins in her legs) we have to put on our radar CHF. There is no evidence of CHF on her CXR and we are waiting on her BNP results.  In the mean time we will treat her pneumonia and COPD exacerbation and hope that we can get by without prednisone because the patient is really hoping to not use prednisone.  We are going to increase her Lasix  to 11m bid for the next 4 days and then she will recheck.  She has had a 9 lb weight gain in the last 3 weeks and patient states her eating is actually down.  I would think it would be responsible for her to have a work-up on the veins in her legs by a vascular specialist.  I suggested that she use compression socks but she is not interested because 'they are uncomfortable'.  D/w Dr DNell RangePA-C  [facility] 05/07/2014 8:39 AM  I have participated in the care of this patient with the Advanced Practice Provider and agree with Diagnosis and Plan as documented. Robert P. DLaney Pastor M.D.

## 2014-05-08 ENCOUNTER — Telehealth: Payer: Self-pay | Admitting: *Deleted

## 2014-05-08 DIAGNOSIS — R062 Wheezing: Secondary | ICD-10-CM

## 2014-05-08 DIAGNOSIS — R609 Edema, unspecified: Secondary | ICD-10-CM

## 2014-05-08 DIAGNOSIS — R0602 Shortness of breath: Secondary | ICD-10-CM

## 2014-05-08 NOTE — Telephone Encounter (Signed)
Blood was not picked up and lavender thrown away.  Pt will be back in on Monday for redraw.  BNP is a lavender frozen thru [facility].

## 2014-05-09 ENCOUNTER — Other Ambulatory Visit (INDEPENDENT_AMBULATORY_CARE_PROVIDER_SITE_OTHER): Payer: 59 | Admitting: *Deleted

## 2014-05-09 DIAGNOSIS — R062 Wheezing: Secondary | ICD-10-CM

## 2014-05-09 DIAGNOSIS — R0602 Shortness of breath: Secondary | ICD-10-CM

## 2014-05-09 DIAGNOSIS — R609 Edema, unspecified: Secondary | ICD-10-CM

## 2014-05-09 NOTE — Progress Notes (Signed)
Pt here for lab draw only

## 2014-05-10 LAB — CMP14+EGFR
A/G RATIO: 1.5 (ref 1.1–2.5)
ALK PHOS: 93 IU/L (ref 39–117)
ALT: 25 IU/L (ref 0–32)
AST: 19 IU/L (ref 0–40)
Albumin: 3.8 g/dL (ref 3.5–5.5)
BUN / CREAT RATIO: 16 (ref 9–23)
BUN: 15 mg/dL (ref 6–24)
CO2: 28 mmol/L (ref 18–29)
Calcium: 9.5 mg/dL (ref 8.7–10.2)
Chloride: 93 mmol/L — ABNORMAL LOW (ref 97–108)
Creatinine, Ser: 0.92 mg/dL (ref 0.57–1.00)
GFR, EST AFRICAN AMERICAN: 79 mL/min/{1.73_m2} (ref >59)
GFR, EST NON AFRICAN AMERICAN: 69 mL/min/{1.73_m2} (ref >59)
GLOBULIN, TOTAL: 2.6 g/dL (ref 1.5–4.5)
Glucose: 350 mg/dL — ABNORMAL HIGH (ref 65–99)
Potassium: 4.7 mmol/L (ref 3.5–5.2)
Sodium: 138 mmol/L (ref 134–144)
Total Bilirubin: 0.3 mg/dL (ref 0.0–1.2)
Total Protein: 6.4 g/dL (ref 6.0–8.5)

## 2014-05-10 LAB — BRAIN NATRIURETIC PEPTIDE: BNP: 100.8 pg/mL — ABNORMAL HIGH (ref 0.0–100.0)

## 2014-07-12 ENCOUNTER — Other Ambulatory Visit: Payer: 59

## 2014-07-12 DIAGNOSIS — Z1212 Encounter for screening for malignant neoplasm of rectum: Secondary | ICD-10-CM

## 2014-07-12 NOTE — Progress Notes (Signed)
Lab only

## 2014-07-14 LAB — FECAL OCCULT BLOOD, IMMUNOCHEMICAL: FECAL OCCULT BLD: NEGATIVE

## 2014-07-18 ENCOUNTER — Ambulatory Visit: Payer: 59 | Admitting: Nurse Practitioner

## 2014-07-25 ENCOUNTER — Ambulatory Visit (INDEPENDENT_AMBULATORY_CARE_PROVIDER_SITE_OTHER): Payer: 59 | Admitting: Nurse Practitioner

## 2014-07-25 ENCOUNTER — Encounter: Payer: Self-pay | Admitting: Nurse Practitioner

## 2014-07-25 VITALS — BP 122/72 | HR 91 | Temp 97.6°F | Ht 64.0 in | Wt 221.0 lb

## 2014-07-25 DIAGNOSIS — J441 Chronic obstructive pulmonary disease with (acute) exacerbation: Secondary | ICD-10-CM

## 2014-07-25 DIAGNOSIS — R300 Dysuria: Secondary | ICD-10-CM

## 2014-07-25 DIAGNOSIS — R609 Edema, unspecified: Secondary | ICD-10-CM

## 2014-07-25 DIAGNOSIS — I10 Essential (primary) hypertension: Secondary | ICD-10-CM

## 2014-07-25 DIAGNOSIS — E785 Hyperlipidemia, unspecified: Secondary | ICD-10-CM

## 2014-07-25 DIAGNOSIS — E119 Type 2 diabetes mellitus without complications: Secondary | ICD-10-CM

## 2014-07-25 LAB — POCT URINALYSIS DIPSTICK
Bilirubin, UA: NEGATIVE
Blood, UA: NEGATIVE
Glucose, UA: 250
Ketones, UA: NEGATIVE
Nitrite, UA: POSITIVE
PROTEIN UA: NEGATIVE
SPEC GRAV UA: 1.02
UROBILINOGEN UA: NEGATIVE
pH, UA: 5

## 2014-07-25 LAB — POCT UA - MICROSCOPIC ONLY
CASTS, UR, LPF, POC: NEGATIVE
CRYSTALS, UR, HPF, POC: NEGATIVE
Mucus, UA: NEGATIVE
RBC, urine, microscopic: NEGATIVE
Yeast, UA: NEGATIVE

## 2014-07-25 LAB — POCT UA - MICROALBUMIN: Microalbumin Ur, POC: 20 mg/L

## 2014-07-25 LAB — POCT GLYCOSYLATED HEMOGLOBIN (HGB A1C): Hemoglobin A1C: 8.8

## 2014-07-25 MED ORDER — METFORMIN HCL 1000 MG PO TABS: 1000.0000 mg | ORAL_TABLET | Freq: Two times a day (BID) | ORAL | Status: DC

## 2014-07-25 MED ORDER — SITAGLIPTIN PHOSPHATE 100 MG PO TABS: 100.0000 mg | ORAL_TABLET | Freq: Every day | ORAL | Status: DC

## 2014-07-25 MED ORDER — GLIMEPIRIDE 4 MG PO TABS: 4.0000 mg | ORAL_TABLET | Freq: Every day | ORAL | Status: DC

## 2014-07-25 MED ORDER — FENOFIBRATE 160 MG PO TABS: 160.0000 mg | ORAL_TABLET | Freq: Every day | ORAL | Status: DC

## 2014-07-25 MED ORDER — FUROSEMIDE 20 MG PO TABS: 20.0000 mg | ORAL_TABLET | Freq: Every day | ORAL | Status: DC

## 2014-07-25 MED ORDER — FLUTICASONE FUROATE-VILANTEROL 100-25 MCG/INH IN AEPB: 1.0000 | INHALATION_SPRAY | RESPIRATORY_TRACT | Status: DC | PRN

## 2014-07-25 MED ORDER — CIPROFLOXACIN HCL 500 MG PO TABS: 500.0000 mg | ORAL_TABLET | Freq: Two times a day (BID) | ORAL | Status: DC

## 2014-07-25 NOTE — Progress Notes (Signed)
Subjective:    Patient ID: Annette Gibson, female    DOB: [birth year], 59 y.o.   MRN: [id number]  Patient here today for follow up- No changes since last visit. No complaints today  Diabetes She presents for her follow-up diabetic visit. She has type 2 diabetes mellitus. No MedicAlert identification noted. Her disease course has been stable. Pertinent negatives for diabetes include no polydipsia, no polyphagia, no visual change and no weakness. Symptoms are stable. Risk factors for coronary artery disease include dyslipidemia, family history, diabetes mellitus, hypertension, obesity and post-menopausal. Current diabetic treatment includes oral agent (dual therapy). Her weight is stable. When asked about meal planning, she reported none. She has not had a previous visit with a dietitian. She rarely participates in exercise. Her breakfast blood glucose is taken between 8-9 am. Her breakfast blood glucose range is generally 110-130 mg/dl. Her overall blood glucose range is 110-130 mg/dl. An ACE inhibitor/angiotensin II receptor blocker is not being taken. She does not see a podiatrist.Eye exam is not current.  Hyperlipidemia This is a chronic problem. The current episode started more than 1 year ago. The problem is uncontrolled. Recent lipid tests were reviewed and are high. Exacerbating diseases include diabetes and obesity. She has no history of hypothyroidism. Pertinent negatives include no myalgias. Current antihyperlipidemic treatment includes statins. The current treatment provides moderate improvement of lipids. Compliance problems include adherence to diet and adherence to exercise.  Risk factors for coronary artery disease include diabetes mellitus, dyslipidemia, obesity and post-menopausal.  Hypertension This is a chronic problem. The current episode started more than 1 year ago. The problem is unchanged. The problem is controlled. Risk factors for coronary artery disease include obesity,  dyslipidemia, family history and post-menopausal state. Past treatments include diuretics. The current treatment provides moderate improvement. Compliance problems include diet and exercise.   COPD Pt takes Breo that seems to work-likes it much better than the advair. Has albuterol inhaler that she uses twice a week-when she is cleaning the house or working in the yard. Peripheral Edema Pt takes lasix daily- Denies any swelling at this time and is working good. osteopenia Just doing weight bearing exercises GAD Curreently not taking anything- is trying stress management     Review of Systems  Constitutional: Negative.   HENT: Negative.   Respiratory: Negative.   Cardiovascular: Negative.   Gastrointestinal: Negative.   Endocrine: Negative for polydipsia and polyphagia.  Genitourinary: Negative.   Musculoskeletal: Negative for myalgias.  Neurological: Negative for weakness.  Psychiatric/Behavioral: Negative.   All other systems reviewed and are negative.      Objective:   Physical Exam  Constitutional: She is oriented to person, place, and time. She appears well-developed and well-nourished.  HENT:  Nose: Nose normal.  Mouth/Throat: Oropharynx is clear and moist.  Eyes: EOM are normal.  Neck: Trachea normal, normal range of motion and full passive range of motion without pain. Neck supple. No JVD present. Carotid bruit is not present. No thyromegaly present.  Cardiovascular: Normal rate, regular rhythm, normal heart sounds and intact distal pulses.  Exam reveals no gallop and no friction rub.   No murmur heard. Pulmonary/Chest: Effort normal and breath sounds normal.  Abdominal: Soft. Bowel sounds are normal. She exhibits no distension and no mass. There is no tenderness.  Musculoskeletal: Normal range of motion. She exhibits no edema.  Lymphadenopathy:    She has no cervical adenopathy.  Neurological: She is alert and oriented to person, place, and time. She has normal  reflexes.  Skin: Skin is warm and dry.  Psychiatric: She has a normal mood and affect. Her behavior is normal. Judgment and thought content normal.      BP 122/72 mmHg  Pulse 91  Temp(Src) 97.6 F (36.4 C) (Oral)  Ht _0  (1.626 m)  Wt 221 lb (100.245 kg)  BMI 37.92 kg/m2   Results for orders placed or performed in visit on 07/25/14  POCT glycosylated hemoglobin (Hb A1C)  Result Value Ref Range   Hemoglobin A1C 8.8   POCT UA - Microscopic Only  Result Value Ref Range   WBC, Ur, HPF, POC 1-5    RBC, urine, microscopic neg    Bacteria, U Microscopic many    Mucus, UA neg    Epithelial cells, urine per micros occ    Crystals, Ur, HPF, POC neg    Casts, Ur, LPF, POC neg    Yeast, UA neg   POCT urinalysis dipstick  Result Value Ref Range   Color, UA yellow    Clarity, UA clear    Glucose, UA 250    Bilirubin, UA neg    Ketones, UA neg    Spec Grav, UA 1.020    Blood, UA neg    pH, UA 5.0    Protein, UA neg    Urobilinogen, UA negative    Nitrite, UA pos    Leukocytes, UA Trace   POCT UA - Microalbumin  Result Value Ref Range   Microalbumin Ur, POC 20 mg/L   Creatinine, POC  mg/dL   Albumin/Creatinine Ratio, Urine, POC         Assessment & Plan:   1. Type 2 diabetes mellitus without complication Watch carbs in diet Added januvia 143m 1 daily- samples for 2 months given - POCT glycosylated hemoglobin (Hb A1C) - POCT UA - Microalbumin - metFORMIN (GLUCOPHAGE) 1000 MG tablet; Take 1 tablet (1,000 mg total) by mouth 2 (two) times daily with a meal.  Dispense: 60 tablet; Refill: 5 - glimepiride (AMARYL) 4 MG tablet; Take 1 tablet (4 mg total) by mouth daily before breakfast.  Dispense: 30 tablet; Refill: 5  2. Essential hypertension Do  Not add salt to diet - CMP14+EGFR  3. Hyperlipidemia Low fat diet - NMR, lipoprofile - fenofibrate 160 MG tablet; Take 1 tablet (160 mg total) by mouth daily.  Dispense: 30 tablet; Refill: 5  4. Dysuria/UTI Take  medication as prescribe Cotton underwear Take shower not bath Cranberry juice, yogurt Force fluids AZO over the counter X2 days Culture pending RTO prn  -cipro [id number] po BID X 3 days #6 no refills - POCT UA - Microscopic Only - POCT urinalysis dipstick  5. COPD exacerbation Avoid allergens - Fluticasone Furoate-Vilanterol (BREO ELLIPTA) 100-25 MCG/INH AEPB; Inhale 1 puff into the lungs as needed.  Dispense: 60 each; Refill: 5  6. Peripheral edema Elevate legs when sitting - furosemide (LASIX) 20 MG tablet; Take 1 tablet (20 mg total) by mouth daily.  Dispense: 30 tablet; Refill: 5    Labs pending Health maintenance reviewed Diet and exercise encouraged Continue all meds Follow up  In 3 months   MaWellsFNP

## 2014-07-25 NOTE — Patient Instructions (Signed)

## 2014-07-26 LAB — CMP14+EGFR
A/G RATIO: 1.6 (ref 1.1–2.5)
ALT: 23 IU/L (ref 0–32)
AST: 18 IU/L (ref 0–40)
Albumin: 4.1 g/dL (ref 3.5–5.5)
Alkaline Phosphatase: 102 IU/L (ref 39–117)
BUN/Creatinine Ratio: 20 (ref 9–23)
BUN: 18 mg/dL (ref 6–24)
Bilirubin Total: <0.2 mg/dL (ref 0.0–1.2)
CO2: 27 mmol/L (ref 18–29)
Calcium: 9.7 mg/dL (ref 8.7–10.2)
Chloride: 96 mmol/L — ABNORMAL LOW (ref 97–108)
Creatinine, Ser: 0.92 mg/dL (ref 0.57–1.00)
GFR calc non Af Amer: 69 mL/min/{1.73_m2} (ref >59)
GFR, EST AFRICAN AMERICAN: 79 mL/min/{1.73_m2} (ref >59)
GLOBULIN, TOTAL: 2.5 g/dL (ref 1.5–4.5)
GLUCOSE: 301 mg/dL — AB (ref 65–99)
POTASSIUM: 4.4 mmol/L (ref 3.5–5.2)
Sodium: 140 mmol/L (ref 134–144)
Total Protein: 6.6 g/dL (ref 6.0–8.5)

## 2014-07-26 LAB — NMR, LIPOPROFILE
Cholesterol: 134 mg/dL (ref 100–199)
HDL Cholesterol by NMR: 37 mg/dL — ABNORMAL LOW (ref >39)
HDL Particle Number: 33.5 umol/L (ref >=30.5)
LDL Particle Number: 854 nmol/L (ref <1000)
LDL Size: 19.8 nm (ref >20.5)
LDL-C: 44 mg/dL (ref 0–99)
LP-IR Score: 96 — ABNORMAL HIGH (ref <=45)
Small LDL Particle Number: 639 nmol/L — ABNORMAL HIGH (ref <=527)
Triglycerides by NMR: 265 mg/dL — ABNORMAL HIGH (ref 0–149)

## 2014-07-26 LAB — MICROALBUMIN, URINE: Microalbumin, Urine: 4.4 ug/mL (ref 0.0–17.0)

## 2014-07-28 ENCOUNTER — Other Ambulatory Visit: Payer: Self-pay | Admitting: *Deleted

## 2014-07-28 LAB — URINE CULTURE

## 2014-07-28 MED ORDER — ECONAZOLE NITRATE 1 % EX CREA: TOPICAL_CREAM | CUTANEOUS | Status: DC

## 2014-08-12 LAB — HM DIABETES EYE EXAM

## 2014-08-15 ENCOUNTER — Encounter: Payer: Self-pay | Admitting: *Deleted

## 2014-09-01 ENCOUNTER — Encounter: Payer: Self-pay | Admitting: *Deleted

## 2014-10-10 ENCOUNTER — Other Ambulatory Visit: Payer: Self-pay | Admitting: Nurse Practitioner

## 2014-11-02 ENCOUNTER — Encounter: Payer: Self-pay | Admitting: Nurse Practitioner

## 2014-11-02 ENCOUNTER — Ambulatory Visit (INDEPENDENT_AMBULATORY_CARE_PROVIDER_SITE_OTHER): Payer: 59 | Admitting: Nurse Practitioner

## 2014-11-02 VITALS — BP 128/74 | HR 99 | Temp 97.1°F | Ht 64.0 in | Wt 215.0 lb

## 2014-11-02 DIAGNOSIS — F411 Generalized anxiety disorder: Secondary | ICD-10-CM

## 2014-11-02 DIAGNOSIS — E119 Type 2 diabetes mellitus without complications: Secondary | ICD-10-CM | POA: Diagnosis not present

## 2014-11-02 DIAGNOSIS — IMO0001 Reserved for inherently not codable concepts without codable children: Secondary | ICD-10-CM

## 2014-11-02 DIAGNOSIS — E785 Hyperlipidemia, unspecified: Secondary | ICD-10-CM

## 2014-11-02 DIAGNOSIS — M8580 Other specified disorders of bone density and structure, unspecified site: Secondary | ICD-10-CM

## 2014-11-02 DIAGNOSIS — R609 Edema, unspecified: Secondary | ICD-10-CM | POA: Diagnosis not present

## 2014-11-02 DIAGNOSIS — J449 Chronic obstructive pulmonary disease, unspecified: Secondary | ICD-10-CM

## 2014-11-02 DIAGNOSIS — I10 Essential (primary) hypertension: Secondary | ICD-10-CM | POA: Diagnosis not present

## 2014-11-02 LAB — POCT GLYCOSYLATED HEMOGLOBIN (HGB A1C): HEMOGLOBIN A1C: 7.6

## 2014-11-02 MED ORDER — GLIMEPIRIDE 4 MG PO TABS: 4.0000 mg | ORAL_TABLET | Freq: Every day | ORAL | Status: DC

## 2014-11-02 MED ORDER — ATORVASTATIN CALCIUM 40 MG PO TABS: ORAL_TABLET | ORAL | Status: DC

## 2014-11-02 MED ORDER — FENOFIBRATE 160 MG PO TABS: 160.0000 mg | ORAL_TABLET | Freq: Every day | ORAL | Status: DC

## 2014-11-02 MED ORDER — SITAGLIPTIN PHOSPHATE 100 MG PO TABS: 100.0000 mg | ORAL_TABLET | Freq: Every day | ORAL | Status: DC

## 2014-11-02 MED ORDER — METFORMIN HCL 1000 MG PO TABS: 1000.0000 mg | ORAL_TABLET | Freq: Two times a day (BID) | ORAL | Status: DC

## 2014-11-02 MED ORDER — FUROSEMIDE 20 MG PO TABS: 20.0000 mg | ORAL_TABLET | Freq: Every day | ORAL | Status: DC

## 2014-11-02 NOTE — Patient Instructions (Signed)
Bone Health Our bones do many things. They provide structure, protect organs, anchor muscles, and store calcium. Adequate calcium in your diet and weight-bearing physical activity help build strong bones, improve bone amounts, and may reduce the risk of weakening of bones (osteoporosis) later in life. PEAK BONE MASS By age 59, the average woman has acquired most of her skeletal bone mass. A large decline occurs in older adults which increases the risk of osteoporosis. In women this occurs around the time of menopause. It is important for young girls to reach their peak bone mass in order to maintain bone health throughout life. A person with high bone mass as a young adult will be more likely to have a higher bone mass later in life. Not enough calcium consumption and physical activity early on could result in a failure to achieve optimum bone mass in adulthood. OSTEOPOROSIS Osteoporosis is a disease of the bones. It is defined as low bone mass with deterioration of bone structure. Osteoporosis leads to an increase risk of fractures with falls. These fractures commonly happen in the wrist, hip, and spine. While men and women of all ages and background can develop osteoporosis, some of the risk factors for osteoporosis are:  Female.  White.  Postmenopausal.  Older adults.  Small in body size.  Eating a diet low in calcium.  Physically inactive.  Smoking.  Use of some medications.  Family history. CALCIUM Calcium is a mineral needed by the body for healthy bones, teeth, and proper function of the heart, muscles, and nerves. The body cannot produce calcium so it must be absorbed through food. Good sources of calcium include:  Dairy products (low fat or nonfat milk, cheese, and yogurt).  Dark green leafy vegetables (bok choy and broccoli).  Calcium fortified foods (orange juice, cereal, bread, soy beverages, and tofu products).  Nuts (almonds). Recommended amounts of calcium vary  for individuals. RECOMMENDED CALCIUM INTAKES Age and Amount in mg per day  Children 1 to 3 years / 700 mg  Children 4 to 8 years / 1,000 mg  Children 9 to 13 years / 1,300 mg  Teens 14 to 18 years / 1,300 mg  Adults 19 to 50 years / 1,000 mg  Adult women 51 to 70 years / 1,200 mg  Adults 71 years and older / 1,200 mg  Pregnant and breastfeeding teens / 1,300 mg  Pregnant and breastfeeding adults / 1,000 mg Vitamin D also plays an important role in healthy bone development. Vitamin D helps in the absorption of calcium. WEIGHT-BEARING PHYSICAL ACTIVITY Regular physical activity has many positive health benefits. Benefits include strong bones. Weight-bearing physical activity early in life is important in reaching peak bone mass. Weight-bearing physical activities cause muscles and bones to work against gravity. Some examples of weight bearing physical activities include:  Walking, jogging, or running.  Field Hockey.  Jumping rope.  Dancing.  Soccer.  Tennis or Racquetball.  Stair climbing.  Basketball.  Hiking.  Weight lifting.  Aerobic fitness classes. Including weight-bearing physical activity into an exercise plan is a great way to keep bones healthy. Adults: Engage in at least 30 minutes of moderate physical activity on most, preferably all, days of the week. Children: Engage in at least 60 minutes of moderate physical activity on most, preferably all, days of the week. FOR MORE INFORMATION [place] Department of Agriculture, Center for Nutrition Policy and Promotion: www.cnpp.usda.gov National Osteoporosis Foundation: www.nof.org Document Released: 08/10/2003 Document Revised: 09/14/2012 Document Reviewed: 11/09/2008 [employer] Patient Information   2015 [employer]. This information is not intended to replace advice given to you by your health care provider. Make sure you discuss any questions you have with your health care provider.  

## 2014-11-02 NOTE — Progress Notes (Signed)
Subjective:    Patient ID: Annette Gibson, female    DOB: [birth year], 59 y.o.   MRN: [id number]  Patient here today for follow up- No changes since last visit. No complaints today  Diabetes She presents for her follow-up diabetic visit. She has type 2 diabetes mellitus. No MedicAlert identification noted. Her disease course has been stable. Pertinent negatives for diabetes include no polydipsia, no polyphagia, no visual change and no weakness. Symptoms are stable. Risk factors for coronary artery disease include dyslipidemia, family history, diabetes mellitus, hypertension, obesity and post-menopausal. Current diabetic treatment includes oral agent (dual therapy). Her weight is stable. When asked about meal planning, she reported none. She has not had a previous visit with a dietitian. She rarely participates in exercise. Her breakfast blood glucose is taken between 8-9 am. Her breakfast blood glucose range is generally 110-130 mg/dl. Her overall blood glucose range is 110-130 mg/dl. An ACE inhibitor/angiotensin II receptor blocker is not being taken. She does not see a podiatrist.Eye exam is not current.  Hyperlipidemia This is a chronic problem. The current episode started more than 1 year ago. The problem is uncontrolled. Recent lipid tests were reviewed and are high. Exacerbating diseases include diabetes and obesity. She has no history of hypothyroidism. Pertinent negatives include no myalgias. Current antihyperlipidemic treatment includes statins. The current treatment provides moderate improvement of lipids. Compliance problems include adherence to diet and adherence to exercise.  Risk factors for coronary artery disease include diabetes mellitus, dyslipidemia, obesity and post-menopausal.  Hypertension This is a chronic problem. The current episode started more than 1 year ago. The problem is unchanged. The problem is controlled. Risk factors for coronary artery disease include obesity,  dyslipidemia, family history and post-menopausal state. Past treatments include diuretics. The current treatment provides moderate improvement. Compliance problems include diet and exercise.   COPD Pt takes Breo that seems to work-likes it much better than the advair. Has albuterol inhaler that she uses twice a week-when she is cleaning the house or working in the yard. Peripheral Edema Pt takes lasix daily- Denies any swelling at this time and is working good. osteopenia Just doing weight bearing exercises GAD Curreently not taking anything- is trying stress management     Review of Systems  Constitutional: Negative.   HENT: Negative.   Respiratory: Negative.   Cardiovascular: Negative.   Gastrointestinal: Negative.   Endocrine: Negative for polydipsia and polyphagia.  Genitourinary: Negative.   Musculoskeletal: Negative for myalgias.  Neurological: Negative for weakness.  Psychiatric/Behavioral: Negative.   All other systems reviewed and are negative.      Objective:   Physical Exam  Constitutional: She is oriented to person, place, and time. She appears well-developed and well-nourished.  HENT:  Nose: Nose normal.  Mouth/Throat: Oropharynx is clear and moist.  Eyes: EOM are normal.  Neck: Trachea normal, normal range of motion and full passive range of motion without pain. Neck supple. No JVD present. Carotid bruit is not present. No thyromegaly present.  Cardiovascular: Normal rate, regular rhythm, normal heart sounds and intact distal pulses.  Exam reveals no gallop and no friction rub.   No murmur heard. Pulmonary/Chest: Effort normal and breath sounds normal.  Abdominal: Soft. Bowel sounds are normal. She exhibits no distension and no mass. There is no tenderness.  Musculoskeletal: Normal range of motion. She exhibits no edema.  Lymphadenopathy:    She has no cervical adenopathy.  Neurological: She is alert and oriented to person, place, and time. She has normal  reflexes.  Skin: Skin is warm and dry.  Psychiatric: She has a normal mood and affect. Her behavior is normal. Judgment and thought content normal.      BP 128/74 mmHg  Pulse 99  Temp(Src) 97.1 F (36.2 C) (Oral)  Ht _0  (1.626 m)  Wt 215 lb (97.523 kg)  BMI 36.89 kg/m2   Results for orders placed or performed in visit on 11/02/14  POCT glycosylated hemoglobin (Hb A1C)  Result Value Ref Range   Hemoglobin A1C 7.6        Assessment & Plan:   1. Type 2 diabetes mellitus without complication Stricter low carb diet - POCT glycosylated hemoglobin (Hb A1C) - metFORMIN (GLUCOPHAGE) 1000 MG tablet; Take 1 tablet (1,000 mg total) by mouth 2 (two) times daily with a meal.  Dispense: 60 tablet; Refill: 5 - sitaGLIPtin (JANUVIA) 100 MG tablet; Take 1 tablet (100 mg total) by mouth daily.  Dispense: 30 tablet; Refill: 5 - glimepiride (AMARYL) 4 MG tablet; Take 1 tablet (4 mg total) by mouth daily before breakfast.  Dispense: 30 tablet; Refill: 5  2. Essential hypertension Do not add slat to diet - CMP14+EGFR  3. Hyperlipidemia Low fat diet - NMR, lipoprofile - fenofibrate 160 MG tablet; Take 1 tablet (160 mg total) by mouth daily.  Dispense: 30 tablet; Refill: 5 - atorvastatin (LIPITOR) 40 MG tablet; Take 1 tablet (40 mg total) by mouth daily at 6 PM.  Dispense: 30 tablet; Refill: 5  4. COPD bronchitis  5. Peripheral edema Elevate legs when sitting - furosemide (LASIX) 20 MG tablet; Take 1 tablet (20 mg total) by mouth daily.  Dispense: 30 tablet; Refill: 5  6. GAD (generalized anxiety disorder) Stress management  7. Osteopenia Weight bearing exercises   Refuses dexa scan and mammogram at this time Labs pending Health maintenance reviewed Diet and exercise encouraged Continue all meds Follow up  In 3 months   [place], FNP

## 2014-11-03 LAB — CMP14+EGFR
ALT: 24 IU/L (ref 0–32)
AST: 18 IU/L (ref 0–40)
Albumin/Globulin Ratio: 1.8 (ref 1.1–2.5)
Albumin: 4.3 g/dL (ref 3.5–5.5)
Alkaline Phosphatase: 75 IU/L (ref 39–117)
BUN/Creatinine Ratio: 16 (ref 9–23)
BUN: 14 mg/dL (ref 6–24)
Bilirubin Total: 0.3 mg/dL (ref 0.0–1.2)
CO2: 30 mmol/L — ABNORMAL HIGH (ref 18–29)
CREATININE: 0.89 mg/dL (ref 0.57–1.00)
Calcium: 9.7 mg/dL (ref 8.7–10.2)
Chloride: 96 mmol/L — ABNORMAL LOW (ref 97–108)
GFR calc non Af Amer: 72 mL/min/{1.73_m2} (ref >59)
GFR, EST AFRICAN AMERICAN: 83 mL/min/{1.73_m2} (ref >59)
Globulin, Total: 2.4 g/dL (ref 1.5–4.5)
Glucose: 198 mg/dL — ABNORMAL HIGH (ref 65–99)
POTASSIUM: 4.7 mmol/L (ref 3.5–5.2)
SODIUM: 140 mmol/L (ref 134–144)
TOTAL PROTEIN: 6.7 g/dL (ref 6.0–8.5)

## 2014-11-03 LAB — NMR, LIPOPROFILE
Cholesterol: 123 mg/dL (ref 100–199)
HDL Cholesterol by NMR: 34 mg/dL — ABNORMAL LOW (ref >39)
HDL PARTICLE NUMBER: 31.3 umol/L (ref >=30.5)
LDL PARTICLE NUMBER: 761 nmol/L (ref <1000)
LDL Size: 19.9 nm (ref >20.5)
LDL-C: 40 mg/dL (ref 0–99)
LP-IR SCORE: 90 — AB (ref <=45)
SMALL LDL PARTICLE NUMBER: 512 nmol/L (ref <=527)
TRIGLYCERIDES BY NMR: 247 mg/dL — AB (ref 0–149)

## 2014-11-23 ENCOUNTER — Encounter: Payer: Self-pay | Admitting: *Deleted

## 2015-02-15 ENCOUNTER — Ambulatory Visit (INDEPENDENT_AMBULATORY_CARE_PROVIDER_SITE_OTHER): Payer: 59 | Admitting: Nurse Practitioner

## 2015-02-15 ENCOUNTER — Encounter: Payer: Self-pay | Admitting: Nurse Practitioner

## 2015-02-15 VITALS — BP 121/70 | HR 92 | Temp 97.0°F | Ht 64.0 in | Wt 218.0 lb

## 2015-02-15 DIAGNOSIS — M8580 Other specified disorders of bone density and structure, unspecified site: Secondary | ICD-10-CM | POA: Diagnosis not present

## 2015-02-15 DIAGNOSIS — F411 Generalized anxiety disorder: Secondary | ICD-10-CM

## 2015-02-15 DIAGNOSIS — E785 Hyperlipidemia, unspecified: Secondary | ICD-10-CM | POA: Diagnosis not present

## 2015-02-15 DIAGNOSIS — J449 Chronic obstructive pulmonary disease, unspecified: Secondary | ICD-10-CM

## 2015-02-15 DIAGNOSIS — IMO0001 Reserved for inherently not codable concepts without codable children: Secondary | ICD-10-CM

## 2015-02-15 DIAGNOSIS — R609 Edema, unspecified: Secondary | ICD-10-CM

## 2015-02-15 DIAGNOSIS — E119 Type 2 diabetes mellitus without complications: Secondary | ICD-10-CM | POA: Diagnosis not present

## 2015-02-15 DIAGNOSIS — I10 Essential (primary) hypertension: Secondary | ICD-10-CM

## 2015-02-15 DIAGNOSIS — R600 Localized edema: Secondary | ICD-10-CM

## 2015-02-15 LAB — POCT GLYCOSYLATED HEMOGLOBIN (HGB A1C): Hemoglobin A1C: 8.3

## 2015-02-15 NOTE — Progress Notes (Signed)
Subjective:    Patient ID: Annette Gibson, female    DOB: [birth year], 59 y.o.   MRN: [id number]  Patient here today for follow up- No changes since last visit. No complaints today  Diabetes She presents for her follow-up diabetic visit. She has type 2 diabetes mellitus. No MedicAlert identification noted. Her disease course has been stable. Pertinent negatives for diabetes include no polydipsia, no polyphagia, no visual change and no weakness. Symptoms are stable. Risk factors for coronary artery disease include dyslipidemia, family history, diabetes mellitus, hypertension, obesity and post-menopausal. Current diabetic treatment includes oral agent (dual therapy). Her weight is stable. When asked about meal planning, she reported none. She has not had a previous visit with a dietitian. She rarely participates in exercise. Her breakfast blood glucose is taken between 8-9 am. Her breakfast blood glucose range is generally 140-180 mg/dl. Her overall blood glucose range is 140-180 mg/dl. An ACE inhibitor/angiotensin II receptor blocker is not being taken. She does not see a podiatrist.Eye exam is not current.  Hyperlipidemia This is a chronic problem. The current episode started more than 1 year ago. The problem is uncontrolled. Recent lipid tests were reviewed and are high. Exacerbating diseases include diabetes and obesity. She has no history of hypothyroidism. Pertinent negatives include no myalgias. Current antihyperlipidemic treatment includes statins. The current treatment provides moderate improvement of lipids. Compliance problems include adherence to diet and adherence to exercise.  Risk factors for coronary artery disease include diabetes mellitus, dyslipidemia, obesity and post-menopausal.  Hypertension This is a chronic problem. The current episode started more than 1 year ago. The problem is unchanged. The problem is controlled. Risk factors for coronary artery disease include obesity,  dyslipidemia, family history and post-menopausal state. Past treatments include diuretics. The current treatment provides moderate improvement. Compliance problems include diet and exercise.   COPD Pt takes Breo that seems to work-likes it much better than the advair. Has albuterol inhaler that she uses twice a week-when she is cleaning the house or working in the yard. Peripheral Edema Pt takes lasix daily- Denies any swelling at this time and is working good. osteopenia Just doing weight bearing exercises GAD Curreently not taking anything- is trying stress management     Review of Systems  Constitutional: Negative.   HENT: Negative.   Respiratory: Negative.   Cardiovascular: Negative.   Gastrointestinal: Negative.   Endocrine: Negative for polydipsia and polyphagia.  Genitourinary: Negative.   Musculoskeletal: Negative for myalgias.  Neurological: Negative for weakness.  Psychiatric/Behavioral: Negative.   All other systems reviewed and are negative.      Objective:   Physical Exam  Constitutional: She is oriented to person, place, and time. She appears well-developed and well-nourished.  HENT:  Nose: Nose normal.  Mouth/Throat: Oropharynx is clear and moist.  Eyes: EOM are normal.  Neck: Trachea normal, normal range of motion and full passive range of motion without pain. Neck supple. No JVD present. Carotid bruit is not present. No thyromegaly present.  Cardiovascular: Normal rate, regular rhythm, normal heart sounds and intact distal pulses.  Exam reveals no gallop and no friction rub.   No murmur heard. Pulmonary/Chest: Effort normal and breath sounds normal.  Abdominal: Soft. Bowel sounds are normal. She exhibits no distension and no mass. There is no tenderness.  Musculoskeletal: Normal range of motion. She exhibits no edema.  Lymphadenopathy:    She has no cervical adenopathy.  Neurological: She is alert and oriented to person, place, and time. She has normal  reflexes.  Skin: Skin is warm and dry.  Psychiatric: She has a normal mood and affect. Her behavior is normal. Judgment and thought content normal.      BP 121/70 mmHg  Pulse 92  Temp(Src) 97 F (36.1 C) (Oral)  Ht 5' 4" (1.626 m)  Wt 218 lb (98.884 kg)  BMI 37.40 kg/m2   Results for orders placed or performed in visit on 02/15/15  POCT glycosylated hemoglobin (Hb A1C)  Result Value Ref Range   Hemoglobin A1C 8.3        Assessment & Plan:   1. Type 2 diabetes mellitus without complication Stricter low carb diet Patient wants to try to improve with diet - POCT glycosylated hemoglobin (Hb A1C) - metFORMIN (GLUCOPHAGE) 1000 MG tablet; Take 1 tablet (1,000 mg total) by mouth 2 (two) times daily with a meal.  Dispense: 60 tablet; Refill: 5 - sitaGLIPtin (JANUVIA) 100 MG tablet; Take 1 tablet (100 mg total) by mouth daily.  Dispense: 30 tablet; Refill: 5 - glimepiride (AMARYL) 4 MG tablet; Take 1 tablet (4 mg total) by mouth daily before breakfast.  Dispense: 30 tablet; Refill: 5  2. Essential hypertension Do not add slat to diet - CMP14+EGFR  3. Hyperlipidemia Low fat diet - NMR, lipoprofile - fenofibrate 160 MG tablet; Take 1 tablet (160 mg total) by mouth daily.  Dispense: 30 tablet; Refill: 5 - atorvastatin (LIPITOR) 40 MG tablet; Take 1 tablet (40 mg total) by mouth daily at 6 PM.  Dispense: 30 tablet; Refill: 5  4. COPD bronchitis  5. Peripheral edema Elevate legs when sitting - furosemide (LASIX) 20 MG tablet; Take 1 tablet (20 mg total) by mouth daily.  Dispense: 30 tablet; Refill: 5  6. GAD (generalized anxiety disorder) Stress management  7. Osteopenia Weight bearing exercises   Refuses dexa scan and mammogram at this time Labs pending Health maintenance reviewed Diet and exercise encouraged Continue all meds Follow up  In 3 months   [place], FNP

## 2015-02-15 NOTE — Patient Instructions (Signed)

## 2015-02-16 LAB — CMP14+EGFR
ALT: 24 IU/L (ref 0–32)
AST: 21 IU/L (ref 0–40)
Albumin/Globulin Ratio: 1.6 (ref 1.1–2.5)
Albumin: 4 g/dL (ref 3.5–5.5)
Alkaline Phosphatase: 79 IU/L (ref 39–117)
BILIRUBIN TOTAL: 0.2 mg/dL (ref 0.0–1.2)
BUN / CREAT RATIO: 13 (ref 9–23)
BUN: 11 mg/dL (ref 6–24)
CHLORIDE: 98 mmol/L (ref 97–108)
CO2: 27 mmol/L (ref 18–29)
Calcium: 9.6 mg/dL (ref 8.7–10.2)
Creatinine, Ser: 0.87 mg/dL (ref 0.57–1.00)
GFR calc Af Amer: 85 mL/min/{1.73_m2} (ref >59)
GFR calc non Af Amer: 74 mL/min/{1.73_m2} (ref >59)
GLUCOSE: 222 mg/dL — AB (ref 65–99)
Globulin, Total: 2.5 g/dL (ref 1.5–4.5)
Potassium: 4.8 mmol/L (ref 3.5–5.2)
Sodium: 142 mmol/L (ref 134–144)
Total Protein: 6.5 g/dL (ref 6.0–8.5)

## 2015-02-16 LAB — LIPID PANEL
Chol/HDL Ratio: 3.5 ratio units (ref 0.0–4.4)
Cholesterol, Total: 123 mg/dL (ref 100–199)
HDL: 35 mg/dL — AB (ref >39)
LDL Calculated: 30 mg/dL (ref 0–99)
Triglycerides: 292 mg/dL — ABNORMAL HIGH (ref 0–149)
VLDL CHOLESTEROL CAL: 58 mg/dL — AB (ref 5–40)

## 2015-05-23 ENCOUNTER — Other Ambulatory Visit: Payer: Self-pay | Admitting: Nurse Practitioner

## 2015-05-26 ENCOUNTER — Encounter: Payer: Self-pay | Admitting: Nurse Practitioner

## 2015-05-26 ENCOUNTER — Ambulatory Visit (INDEPENDENT_AMBULATORY_CARE_PROVIDER_SITE_OTHER): Payer: 59 | Admitting: Nurse Practitioner

## 2015-05-26 VITALS — BP 133/79 | HR 194 | Temp 97.2°F | Ht 64.0 in | Wt 223.0 lb

## 2015-05-26 DIAGNOSIS — I10 Essential (primary) hypertension: Secondary | ICD-10-CM

## 2015-05-26 DIAGNOSIS — E119 Type 2 diabetes mellitus without complications: Secondary | ICD-10-CM | POA: Diagnosis not present

## 2015-05-26 DIAGNOSIS — M8580 Other specified disorders of bone density and structure, unspecified site: Secondary | ICD-10-CM | POA: Diagnosis not present

## 2015-05-26 DIAGNOSIS — E785 Hyperlipidemia, unspecified: Secondary | ICD-10-CM

## 2015-05-26 DIAGNOSIS — J449 Chronic obstructive pulmonary disease, unspecified: Secondary | ICD-10-CM | POA: Diagnosis not present

## 2015-05-26 DIAGNOSIS — F411 Generalized anxiety disorder: Secondary | ICD-10-CM | POA: Diagnosis not present

## 2015-05-26 DIAGNOSIS — Z23 Encounter for immunization: Secondary | ICD-10-CM | POA: Diagnosis not present

## 2015-05-26 DIAGNOSIS — R609 Edema, unspecified: Secondary | ICD-10-CM | POA: Diagnosis not present

## 2015-05-26 DIAGNOSIS — IMO0001 Reserved for inherently not codable concepts without codable children: Secondary | ICD-10-CM

## 2015-05-26 LAB — POCT GLYCOSYLATED HEMOGLOBIN (HGB A1C): Hemoglobin A1C: 8.2

## 2015-05-26 MED ORDER — ATORVASTATIN CALCIUM 40 MG PO TABS: ORAL_TABLET | ORAL | Status: DC

## 2015-05-26 MED ORDER — FUROSEMIDE 20 MG PO TABS: 20.0000 mg | ORAL_TABLET | Freq: Every day | ORAL | Status: DC

## 2015-05-26 MED ORDER — GLIMEPIRIDE 4 MG PO TABS: 4.0000 mg | ORAL_TABLET | Freq: Every day | ORAL | Status: DC

## 2015-05-26 MED ORDER — FENOFIBRATE 160 MG PO TABS: 160.0000 mg | ORAL_TABLET | Freq: Every day | ORAL | Status: DC

## 2015-05-26 MED ORDER — SITAGLIPTIN PHOSPHATE 100 MG PO TABS: 100.0000 mg | ORAL_TABLET | Freq: Every day | ORAL | Status: DC

## 2015-05-26 MED ORDER — FLUTICASONE FUROATE-VILANTEROL 100-25 MCG/INH IN AEPB: INHALATION_SPRAY | RESPIRATORY_TRACT | Status: DC

## 2015-05-26 MED ORDER — METFORMIN HCL 1000 MG PO TABS: 1000.0000 mg | ORAL_TABLET | Freq: Two times a day (BID) | ORAL | Status: DC

## 2015-05-26 NOTE — Progress Notes (Addendum)
Subjective:    Patient ID: Annette Gibson, female    DOB: [birth year], 59 y.o.   MRN: [id number]  Patient here today for follow up- No changes since last visit. No complaints today  Diabetes She presents for her follow-up diabetic visit. She has type 2 diabetes mellitus. No MedicAlert identification noted. Her disease course has been stable. Pertinent negatives for diabetes include no polydipsia, no polyphagia, no visual change and no weakness. Symptoms are stable. Risk factors for coronary artery disease include dyslipidemia, family history, diabetes mellitus, hypertension, obesity and post-menopausal. Current diabetic treatment includes oral agent (dual therapy). Her weight is stable. When asked about meal planning, she reported none. She has not had a previous visit with a dietitian. She rarely participates in exercise. Her breakfast blood glucose is taken between 8-9 am. Her breakfast blood glucose range is generally 140-180 mg/dl. Her overall blood glucose range is 140-180 mg/dl. An ACE inhibitor/angiotensin II receptor blocker is not being taken. She does not see a podiatrist.Eye exam is not current.  Hyperlipidemia This is a chronic problem. The current episode started more than 1 year ago. The problem is uncontrolled. Recent lipid tests were reviewed and are high. Exacerbating diseases include diabetes and obesity. She has no history of hypothyroidism. Pertinent negatives include no myalgias. Current antihyperlipidemic treatment includes statins. The current treatment provides moderate improvement of lipids. Compliance problems include adherence to diet and adherence to exercise.  Risk factors for coronary artery disease include diabetes mellitus, dyslipidemia, obesity and post-menopausal.  Hypertension This is a chronic problem. The current episode started more than 1 year ago. The problem is unchanged. The problem is controlled. Risk factors for coronary artery disease include obesity,  dyslipidemia, family history and post-menopausal state. Past treatments include diuretics. The current treatment provides moderate improvement. Compliance problems include diet and exercise.   COPD Pt takes Breo that seems to work-likes it much better than the advair. Has albuterol inhaler that she uses twice a week-when she is cleaning the house or working in the yard. Peripheral Edema Pt takes lasix daily- Denies any swelling at this time and is working good. osteopenia Just doing weight bearing exercises GAD Curreently not taking anything- is trying stress management     Review of Systems  Constitutional: Negative.   HENT: Negative.   Respiratory: Negative.   Cardiovascular: Negative.   Gastrointestinal: Negative.   Endocrine: Negative for polydipsia and polyphagia.  Genitourinary: Negative.   Musculoskeletal: Negative for myalgias.  Neurological: Negative for weakness.  Psychiatric/Behavioral: Negative.   All other systems reviewed and are negative.      Objective:   Physical Exam  Constitutional: She is oriented to person, place, and time. She appears well-developed and well-nourished.  HENT:  Nose: Nose normal.  Mouth/Throat: Oropharynx is clear and moist.  Eyes: EOM are normal.  Neck: Trachea normal, normal range of motion and full passive range of motion without pain. Neck supple. No JVD present. Carotid bruit is not present. No thyromegaly present.  Cardiovascular: Normal rate, regular rhythm, normal heart sounds and intact distal pulses.  Exam reveals no gallop and no friction rub.   No murmur heard. Pulmonary/Chest: Effort normal and breath sounds normal.  Abdominal: Soft. Bowel sounds are normal. She exhibits no distension and no mass. There is no tenderness.  Musculoskeletal: Normal range of motion. She exhibits no edema.  Lymphadenopathy:    She has no cervical adenopathy.  Neurological: She is alert and oriented to person, place, and time. She has normal  reflexes.  Skin: Skin is warm and dry.  Psychiatric: She has a normal mood and affect. Her behavior is normal. Judgment and thought content normal.      BP 133/79 mmHg  Pulse 194  Temp(Src) 97.2 F (36.2 C) (Oral)  Ht _0  (1.626 m)  Wt 223 lb (101.152 kg)  BMI 38.26 kg/m2   Results for orders placed or performed in visit on 05/26/15  POCT glycosylated hemoglobin (Hb A1C)  Result Value Ref Range   Hemoglobin A1C 8.2        Assessment & Plan:   1. Type 2 diabetes mellitus without complication, without long-term current use of insulin (HCC) Stricter carb counting or is going  To need injection. - POCT glycosylated hemoglobin (Hb A1C) - sitaGLIPtin (JANUVIA) 100 MG tablet; Take 1 tablet (100 mg total) by mouth daily.  Dispense: 30 tablet; Refill: 5 - metFORMIN (GLUCOPHAGE) 1000 MG tablet; Take 1 tablet (1,000 mg total) by mouth 2 (two) times daily with a meal.  Dispense: 60 tablet; Refill: 5 - glimepiride (AMARYL) 4 MG tablet; Take 1 tablet (4 mg total) by mouth daily before breakfast.  Dispense: 30 tablet; Refill: 5  2. Essential hypertension Do not add salt to diet - CMP14+EGFR  3. Hyperlipidemia Low fat diet - Lipid panel - fenofibrate 160 MG tablet; Take 1 tablet (160 mg total) by mouth daily.  Dispense: 30 tablet; Refill: 5 - atorvastatin (LIPITOR) 40 MG tablet; Take 1 tablet (40 mg total) by mouth daily at 6 PM.  Dispense: 30 tablet; Refill: 5  4. Peripheral edema Elevated legs when sitting - furosemide (LASIX) 20 MG tablet; Take 1 tablet (20 mg total) by mouth daily.  Dispense: 30 tablet; Refill: 5  5. GAD (generalized anxiety disorder) stress management  6. COPD bronchitis - Fluticasone Furoate-Vilanterol (BREO ELLIPTA) 100-25 MCG/INH AEPB; INHALE 1 PUFF INTO LUNGS DAILY AS DIRECTED  Dispense: 60 each; Refill: 5  7. Osteopenia Weight bearing exercises    Labs pending Health maintenance reviewed Diet and exercise encouraged Continue all meds  Follow  up  In 3 month [place], FNP

## 2015-05-26 NOTE — Patient Instructions (Signed)

## 2015-05-27 LAB — LIPID PANEL
CHOLESTEROL TOTAL: 131 mg/dL (ref 100–199)
Chol/HDL Ratio: 3.3 ratio units (ref 0.0–4.4)
HDL: 40 mg/dL (ref >39)
LDL Calculated: 46 mg/dL (ref 0–99)
Triglycerides: 225 mg/dL — ABNORMAL HIGH (ref 0–149)
VLDL Cholesterol Cal: 45 mg/dL — ABNORMAL HIGH (ref 5–40)

## 2015-05-27 LAB — CMP14+EGFR
ALK PHOS: 83 IU/L (ref 39–117)
ALT: 23 IU/L (ref 0–32)
AST: 15 IU/L (ref 0–40)
Albumin/Globulin Ratio: 1.5 (ref 1.1–2.5)
Albumin: 4 g/dL (ref 3.5–5.5)
BILIRUBIN TOTAL: 0.3 mg/dL (ref 0.0–1.2)
BUN / CREAT RATIO: 12 (ref 9–23)
BUN: 10 mg/dL (ref 6–24)
CHLORIDE: 100 mmol/L (ref 96–106)
CO2: 29 mmol/L (ref 18–29)
Calcium: 9.8 mg/dL (ref 8.7–10.2)
Creatinine, Ser: 0.86 mg/dL (ref 0.57–1.00)
GFR calc Af Amer: 86 mL/min/{1.73_m2} (ref >59)
GFR calc non Af Amer: 74 mL/min/{1.73_m2} (ref >59)
GLUCOSE: 209 mg/dL — AB (ref 65–99)
Globulin, Total: 2.6 g/dL (ref 1.5–4.5)
Potassium: 4.7 mmol/L (ref 3.5–5.2)
Sodium: 143 mmol/L (ref 134–144)
Total Protein: 6.6 g/dL (ref 6.0–8.5)

## 2015-08-25 ENCOUNTER — Ambulatory Visit (INDEPENDENT_AMBULATORY_CARE_PROVIDER_SITE_OTHER): Payer: 59 | Admitting: Nurse Practitioner

## 2015-08-25 ENCOUNTER — Encounter: Payer: Self-pay | Admitting: Nurse Practitioner

## 2015-08-25 VITALS — BP 125/80 | HR 90 | Temp 99.5°F | Ht 64.0 in | Wt 226.4 lb

## 2015-08-25 DIAGNOSIS — I159 Secondary hypertension, unspecified: Secondary | ICD-10-CM | POA: Diagnosis not present

## 2015-08-25 DIAGNOSIS — R600 Localized edema: Secondary | ICD-10-CM

## 2015-08-25 DIAGNOSIS — J449 Chronic obstructive pulmonary disease, unspecified: Secondary | ICD-10-CM

## 2015-08-25 DIAGNOSIS — E119 Type 2 diabetes mellitus without complications: Secondary | ICD-10-CM | POA: Diagnosis not present

## 2015-08-25 DIAGNOSIS — F411 Generalized anxiety disorder: Secondary | ICD-10-CM

## 2015-08-25 DIAGNOSIS — E559 Vitamin D deficiency, unspecified: Secondary | ICD-10-CM | POA: Diagnosis not present

## 2015-08-25 DIAGNOSIS — E785 Hyperlipidemia, unspecified: Secondary | ICD-10-CM

## 2015-08-25 DIAGNOSIS — Z1212 Encounter for screening for malignant neoplasm of rectum: Secondary | ICD-10-CM | POA: Diagnosis not present

## 2015-08-25 DIAGNOSIS — Z1159 Encounter for screening for other viral diseases: Secondary | ICD-10-CM | POA: Diagnosis not present

## 2015-08-25 DIAGNOSIS — IMO0001 Reserved for inherently not codable concepts without codable children: Secondary | ICD-10-CM

## 2015-08-25 DIAGNOSIS — R609 Edema, unspecified: Secondary | ICD-10-CM

## 2015-08-25 LAB — BAYER DCA HB A1C WAIVED: HB A1C: 8.7 % — AB (ref <7.0)

## 2015-08-25 NOTE — Addendum Note (Signed)
Addended by: Liliane Bade on: 08/25/2015 02:37 PM   Modules accepted: Orders

## 2015-08-25 NOTE — Progress Notes (Signed)
Subjective:    Patient ID: Annette Gibson, female    DOB: 1955/11/05, 60 y.o.   MRN: [id number]  Patient here today for follow up of chronic medical problems.  Outpatient Encounter Prescriptions as of 08/25/2015  Medication Sig  . albuterol (PROVENTIL HFA;VENTOLIN HFA) 108 (90 BASE) MCG/ACT inhaler Inhale 1 puff into the lungs every 6 (six) hours as needed.  Marland Kitchen atorvastatin (LIPITOR) 40 MG tablet Take 1 tablet (40 mg total) by mouth daily at 6 PM.  . fenofibrate 160 MG tablet Take 1 tablet (160 mg total) by mouth daily.  . Fluticasone Furoate-Vilanterol (BREO ELLIPTA) 100-25 MCG/INH AEPB INHALE 1 PUFF INTO LUNGS DAILY AS DIRECTED  . furosemide (LASIX) 20 MG tablet Take 1 tablet (20 mg total) by mouth daily.  Marland Kitchen glimepiride (AMARYL) 4 MG tablet Take 1 tablet (4 mg total) by mouth daily before breakfast.  . glucose blood test strip Test once daily dx code:250.0  . metFORMIN (GLUCOPHAGE) 1000 MG tablet Take 1 tablet (1,000 mg total) by mouth 2 (two) times daily with a meal.  . sitaGLIPtin (JANUVIA) 100 MG tablet Take 1 tablet (100 mg total) by mouth daily.  . [DISCONTINUED] albuterol (PROVENTIL) (2.5 MG/3ML) 0.083% nebulizer solution 2.5 mg   . [DISCONTINUED] ipratropium (ATROVENT) nebulizer solution 0.5 mg    No facility-administered encounter medications on file as of 08/25/2015.      Hypertension This is a chronic problem. The current episode started more than 1 year ago. The problem is unchanged. The problem is controlled. Risk factors for coronary artery disease include obesity, dyslipidemia, family history and post-menopausal state. Past treatments include diuretics. The current treatment provides moderate improvement. Compliance problems include diet and exercise.   Hyperlipidemia This is a chronic problem. The current episode started more than 1 year ago. The problem is uncontrolled. Recent lipid tests were reviewed and are high. Exacerbating diseases include diabetes and obesity.  She has no history of hypothyroidism. Pertinent negatives include no myalgias. Current antihyperlipidemic treatment includes statins. The current treatment provides moderate improvement of lipids. Compliance problems include adherence to diet and adherence to exercise.  Risk factors for coronary artery disease include diabetes mellitus, dyslipidemia, obesity and post-menopausal.  Diabetes She presents for her follow-up diabetic visit. She has type 2 diabetes mellitus. No MedicAlert identification noted. Her disease course has been stable. Pertinent negatives for diabetes include no polydipsia, no polyphagia, no visual change and no weakness. Symptoms are stable. Risk factors for coronary artery disease include dyslipidemia, family history, diabetes mellitus, hypertension, obesity and post-menopausal. Current diabetic treatment includes oral agent (dual therapy). Her weight is stable. When asked about meal planning, she reported none. She has not had a previous visit with a dietitian. She rarely participates in exercise. Her breakfast blood glucose is taken between 8-9 am. Her breakfast blood glucose range is generally 140-180 mg/dl. Her overall blood glucose range is 140-180 mg/dl. An ACE inhibitor/angiotensin II receptor blocker is not being taken. She does not see a podiatrist.Eye exam is not current.  COPD Pt takes Breo that seems to work-likes it much better than the advair. Has albuterol inhaler that she uses twice a week-when she is cleaning the house or working in the yard. Peripheral Edema Pt takes lasix daily- Denies any swelling at this time and is working good. osteopenia Just doing weight bearing exercises GAD Curreently not taking anything- is trying stress management     Review of Systems  Constitutional: Negative.   HENT: Negative.   Respiratory: Negative.  Cardiovascular: Negative.   Gastrointestinal: Negative.   Endocrine: Negative for polydipsia and polyphagia.   Genitourinary: Negative.   Musculoskeletal: Negative for myalgias.  Neurological: Negative for weakness.  Psychiatric/Behavioral: Negative.   All other systems reviewed and are negative.      Objective:   Physical Exam  Constitutional: She is oriented to person, place, and time. She appears well-developed and well-nourished.  HENT:  Nose: Nose normal.  Mouth/Throat: Oropharynx is clear and moist.  Eyes: EOM are normal.  Neck: Trachea normal, normal range of motion and full passive range of motion without pain. Neck supple. No JVD present. Carotid bruit is not present. No thyromegaly present.  Cardiovascular: Normal rate, regular rhythm, normal heart sounds and intact distal pulses.  Exam reveals no gallop and no friction rub.   No murmur heard. Pulmonary/Chest: Effort normal and breath sounds normal.  Abdominal: Soft. Bowel sounds are normal. She exhibits no distension and no mass. There is no tenderness.  Musculoskeletal: Normal range of motion. She exhibits no edema.  Lymphadenopathy:    She has no cervical adenopathy.  Neurological: She is alert and oriented to person, place, and time. She has normal reflexes.  Skin: Skin is warm and dry.  Psychiatric: She has a normal mood and affect. Her behavior is normal. Judgment and thought content normal.   BP 125/80 mmHg  Pulse 90  Temp(Src) 99.5 F (37.5 C) (Oral)  Ht _0  (1.626 m)  Wt 226 lb 6.4 oz (102.694 kg)  BMI 38.84 kg/m2   hgbA1c- 8.7%  Assessment & Plan:   1. Secondary hypertension, unspecified Do not add salt to diet - CMP14+EGFR  2. Hyperlipidemia Low fat diet - Lipid panel  3. Type 2 diabetes mellitus without complication, without long-term current use of insulin (HCC) Stricter carb counting, appointment scheduled with clinical pharmacist to discuss Victoza - A1c completed - Microalbumin / creatinine urine ratio  4. Vitamin D deficiency - VITAMIN D 25 Hydroxy (Vit-D Deficiency, Fractures)  5. Need  for hepatitis C screening test  - Hepatitis C antibody   6. Screening for malignant neoplasm of the rectum - Fecal occult blood, imunochemical; card given, to be returned to the clinic lab  7. COPD bronchitis Smoking cessation encouraged  8. Peripheral edema Elevated legs when sitting  9. GAD (generalized anxiety disorder) Stress management   Labs pending Health maintenance reviewed Diet and exercise encouraged Continue all meds Follow up  in 3 weeks  Mary-Margaret Hassell Done, FNP

## 2015-08-25 NOTE — Patient Instructions (Signed)

## 2015-08-26 LAB — MICROALBUMIN / CREATININE URINE RATIO
CREATININE, UR: 160.7 mg/dL
MICROALB/CREAT RATIO: 15.9 mg/g{creat} (ref 0.0–30.0)
MICROALBUM., U, RANDOM: 25.5 ug/mL

## 2015-08-26 LAB — LIPID PANEL
CHOL/HDL RATIO: 2.8 ratio (ref 0.0–4.4)
Cholesterol, Total: 115 mg/dL (ref 100–199)
HDL: 41 mg/dL (ref >39)
LDL Calculated: 31 mg/dL (ref 0–99)
Triglycerides: 214 mg/dL — ABNORMAL HIGH (ref 0–149)
VLDL Cholesterol Cal: 43 mg/dL — ABNORMAL HIGH (ref 5–40)

## 2015-08-26 LAB — CMP14+EGFR
A/G RATIO: 1.5 (ref 1.2–2.2)
ALT: 27 IU/L (ref 0–32)
AST: 28 IU/L (ref 0–40)
Albumin: 4 g/dL (ref 3.5–5.5)
Alkaline Phosphatase: 91 IU/L (ref 39–117)
BILIRUBIN TOTAL: 0.2 mg/dL (ref 0.0–1.2)
BUN/Creatinine Ratio: 13 (ref 9–23)
BUN: 11 mg/dL (ref 6–24)
CALCIUM: 9.6 mg/dL (ref 8.7–10.2)
CO2: 28 mmol/L (ref 18–29)
Chloride: 97 mmol/L (ref 96–106)
Creatinine, Ser: 0.86 mg/dL (ref 0.57–1.00)
GFR, EST AFRICAN AMERICAN: 86 mL/min/{1.73_m2} (ref >59)
GFR, EST NON AFRICAN AMERICAN: 74 mL/min/{1.73_m2} (ref >59)
GLOBULIN, TOTAL: 2.7 g/dL (ref 1.5–4.5)
Glucose: 264 mg/dL — ABNORMAL HIGH (ref 65–99)
POTASSIUM: 4.7 mmol/L (ref 3.5–5.2)
SODIUM: 142 mmol/L (ref 134–144)
TOTAL PROTEIN: 6.7 g/dL (ref 6.0–8.5)

## 2015-08-26 LAB — VITAMIN D 25 HYDROXY (VIT D DEFICIENCY, FRACTURES): VIT D 25 HYDROXY: 16.7 ng/mL — AB (ref 30.0–100.0)

## 2015-08-26 LAB — HEPATITIS C ANTIBODY: HCV Ab: <0.1 s/co ratio (ref 0.0–0.9)

## 2015-09-01 ENCOUNTER — Encounter: Payer: Self-pay | Admitting: Pharmacist

## 2015-09-01 ENCOUNTER — Ambulatory Visit (INDEPENDENT_AMBULATORY_CARE_PROVIDER_SITE_OTHER): Payer: 59 | Admitting: Pharmacist

## 2015-09-01 VITALS — BP 110/70 | HR 84 | Ht 64.0 in | Wt 226.5 lb

## 2015-09-01 DIAGNOSIS — E119 Type 2 diabetes mellitus without complications: Secondary | ICD-10-CM

## 2015-09-01 LAB — HM MAMMOGRAPHY

## 2015-09-01 MED ORDER — DULAGLUTIDE 0.75 MG/0.5ML ~~LOC~~ SOAJ: 0.7500 mg | SUBCUTANEOUS | Status: DC

## 2015-09-01 NOTE — Patient Instructions (Signed)
Stop Januvia - Start Trulicity 0.35DH - inject 1 syringe once weekly Continue metformin 1037m 1 tablet twice a day with food Continue glimepiride 440m1 tablet once daily  Diabetes and Standards of Medical Care   Diabetes is complicated. You may find that your diabetes team includes a dietitian, nurse, diabetes educator, eye doctor, and more. To help everyone know what is going on and to help you get the care you deserve, the following schedule of care was developed to help keep you on track. Below are the tests, exams, vaccines, medicines, education, and plans you will need.  Blood Glucose Goals Prior to meals = 80 - 130 Within 2 hours of the start of a meal = less than 180  HbA1c test (goal is less than 7.0% - your last value was 8.7%) This test shows how well you have controlled your glucose over the past 2 to 3 months. It is used to see if your diabetes management plan needs to be adjusted.   It is performed at least 2 times a year if you are meeting treatment goals.  It is performed 4 times a year if therapy has changed or if you are not meeting treatment goals.  Blood pressure test  This test is performed at every routine medical visit. The goal is less than 140/90 mmHg for most people, but 130/80 mmHg in some cases. Ask your health care provider about your goal.  Dental exam  Follow up with the dentist regularly.  Eye exam  If you are diagnosed with type 1 diabetes as a child, get an exam upon reaching the age of 1031ears or older and have had diabetes for 3 to 5 years. Yearly eye exams are recommended after that initial eye exam.  If you are diagnosed with type 1 diabetes as an adult, get an exam within 5 years of diagnosis and then yearly.  If you are diagnosed with type 2 diabetes, get an exam as soon as possible after the diagnosis and then yearly.  Foot care exam  Visual foot exams are performed at every routine medical visit. The exams check for cuts, injuries, or  other problems with the feet.  A comprehensive foot exam should be done yearly. This includes visual inspection as well as assessing foot pulses and testing for loss of sensation.  Check your feet nightly for cuts, injuries, or other problems with your feet. Tell your health care provider if anything is not healing.  Kidney function test (urine microalbumin)  This test is performed once a year.  Type 1 diabetes: The first test is performed 5 years after diagnosis.  Type 2 diabetes: The first test is performed at the time of diagnosis.  A serum creatinine and estimated glomerular filtration rate (eGFR) test is done once a year to assess the level of chronic kidney disease (CKD), if present.  Lipid profile (cholesterol, HDL, LDL, triglycerides)  Performed every 5 years for most people.  The goal for LDL is less than 100 mg/dL. If you are at high risk, the goal is less than 70 mg/dL.  The goal for HDL is 40 mg/dL to 50 mg/dL for men and 50 mg/dL to 60 mg/dL for women. An HDL cholesterol of 60 mg/dL or higher gives some protection against heart disease.  The goal for triglycerides is less than 150 mg/dL.  Influenza vaccine, pneumococcal vaccine, and hepatitis B vaccine  The influenza vaccine is recommended yearly.  The pneumococcal vaccine is generally given once in a  lifetime. However, there are some instances when another vaccination is recommended. Check with your health care provider.  The hepatitis B vaccine is also recommended for adults with diabetes.  Diabetes self-management education  Education is recommended at diagnosis and ongoing as needed.  Treatment plan  Your treatment plan is reviewed at every medical visit.  Document Released: 03/17/2009 Document Revised: 01/20/2013 Document Reviewed: 10/20/2012 [facility] Patient Information 2014 [place].

## 2015-09-01 NOTE — Progress Notes (Signed)
Subjective:    Annette Gibson is a 60 y.o. female who presents for an initial evaluation of Type 2 diabetes mellitus.  Current symptoms/problems include hyperglycemia and have been worsening. Symptoms have been present for 4 months.  Known diabetic complications: none Cardiovascular risk factors: diabetes mellitus, dyslipidemia, hypertension, obesity (BMI >= 30 kg/m2) and sedentary lifestyle Current diabetic medications include januvia 19m qd; metformin 10062mbid, glimepiride 69m29mam.   Eye exam current (within one year): no-last eye exam 08/12/2014 Weight trend: stable Prior visit with dietician: yes - but has been a long time Current diet: in general, an "unhealthy" diet Current exercise: walking - just started about 2 weeks ago  Current monitoring regimen: home blood tests - 4 times monthly Home blood sugar records: patient did not bring in record  Any episodes of hypoglycemia? no  Is She on ACE inhibitor or angiotensin II receptor blocker? No    The following portions of the patient's history were reviewed and updated as appropriate: allergies, current medications, past family history, past medical history, past social history, past surgical history and problem list.     Objective:    BP 110/70 mmHg  Pulse 84  Ht _0  (1.626 m)  Wt 226 lb 8 oz (102.74 kg)  BMI 38.86 kg/m2  A1c = 8.7% (08/25/2015)  Lab Review GLUCOSE (mg/dL)  Date Value  08/25/2015 264*  05/26/2015 209*  02/15/2015 222*   CO2 (mmol/L)  Date Value  08/25/2015 28  05/26/2015 29  02/15/2015 27   BUN (mg/dL)  Date Value  08/25/2015 11  05/26/2015 10  02/15/2015 11   CREATININE, SER (mg/dL)  Date Value  08/25/2015 0.86  05/26/2015 0.86  02/15/2015 0.87    Assessment:    Diabetes Mellitus type II, under inadequate control.    Plan:    1.  Rx changes: stop Januvia - switch to Trulicity 0.72.59DG weekly - patietn given samples (#2) and first injeciton given in office.  2.   Education: Reviewed 'ABCs' of diabetes management (respective goals in parentheses):  A1C (<7), blood pressure (<130/80), and cholesterol (LDL <100). 3.  Reviewed HBG goals - recommended change BG once daily at varying times 4.  Discussed CHO counting diet and recommended portion sizes - handout given 5.  Continue with walking - goal is to get 150 minutes of phy act per week. 6.  Follow up: 6 weeks    TamCherre RobinsharmD, CPP, CDE

## 2015-09-13 ENCOUNTER — Encounter: Payer: Self-pay | Admitting: *Deleted

## 2015-10-12 ENCOUNTER — Encounter (INDEPENDENT_AMBULATORY_CARE_PROVIDER_SITE_OTHER): Payer: Self-pay

## 2015-10-13 ENCOUNTER — Encounter: Payer: Self-pay | Admitting: Pharmacist

## 2015-10-13 ENCOUNTER — Ambulatory Visit (INDEPENDENT_AMBULATORY_CARE_PROVIDER_SITE_OTHER): Payer: 59 | Admitting: Pharmacist

## 2015-10-13 VITALS — BP 130/78 | HR 85 | Ht 64.0 in | Wt 223.0 lb

## 2015-10-13 DIAGNOSIS — E119 Type 2 diabetes mellitus without complications: Secondary | ICD-10-CM | POA: Diagnosis not present

## 2015-10-13 MED ORDER — DULAGLUTIDE 1.5 MG/0.5ML ~~LOC~~ SOAJ: 1.5000 mg | SUBCUTANEOUS | Status: DC

## 2015-10-13 NOTE — Progress Notes (Signed)
Subjective:    Annette Gibson is a 60 y.o. female who presents for re -evaluation of Type 2 diabetes mellitus.  Patient was last seen by myself 1 month ago when Trulicity 6.74AD weekly was started.  She has tolerated well and denies N/V or abdominal pain.  Other meds for diabetes include metformin 150m bid and glimepiride 423mqam  Known diabetic complications: none Cardiovascular risk factors: diabetes mellitus, dyslipidemia, hypertension, obesity (BMI >= 30 kg/m2) and sedentary lifestyle   Eye exam current (within one year): no-last eye exam 08/12/2014 Weight trend: decreased by 4# over the last 6 weeks Prior visit with dietician: yes -  Current diet: improved - she is eating smaller portions and more non starchy vegetables. Current exercise: walking - just started about 2 weeks ago  Current monitoring regimen: home blood tests - one to two times daily Home blood sugar records: BG ranges from 105 to 200 Any episodes of hypoglycemia? no  Is She on ACE inhibitor or angiotensin II receptor blocker? No    The following portions of the patient's history were reviewed and updated as appropriate: allergies, current medications, past family history, past medical history, past social history, past surgical history and problem list.     Objective:    BP 130/78 mmHg  Pulse 85  Ht _0  (1.626 m)  Wt 223 lb (101.152 kg)  BMI 38.26 kg/m2  A1c = 8.7% (08/25/2015)  Lab Review GLUCOSE (mg/dL)  Date Value  08/25/2015 264*  05/26/2015 209*  02/15/2015 222*   CO2 (mmol/L)  Date Value  08/25/2015 28  05/26/2015 29  02/15/2015 27   BUN (mg/dL)  Date Value  08/25/2015 11  05/26/2015 10  02/15/2015 11   CREATININE, SER (mg/dL)  Date Value  08/25/2015 0.86  05/26/2015 0.86  02/15/2015 0.87    Assessment:    Diabetes Mellitus type II, under inadequate control.    Plan:    1.  Rx changes: Increase to Trulicity 1.5.2ZGQ weekly  2.  Education: Reviewed 'ABCs' of diabetes  management (respective goals in parentheses):  A1C (<7), blood pressure (<130/80), and cholesterol (LDL <100). 3.  Reviewed HBG goals - Continue to check BG once daily at varying times 4.  Reviewed CHO counting diet and recommended portion sizes - good job - continue same 5.  Continue with walking - goal is to get 150 minutes of phy act per week. 6.  Follow up: 6 weeks    TaCherre RobinsPharmD, CPP, CDE

## 2015-10-19 LAB — FECAL OCCULT BLOOD, IMMUNOCHEMICAL: Fecal Occult Bld: NEGATIVE

## 2015-11-24 ENCOUNTER — Other Ambulatory Visit: Payer: Self-pay | Admitting: Nurse Practitioner

## 2015-12-08 ENCOUNTER — Ambulatory Visit (INDEPENDENT_AMBULATORY_CARE_PROVIDER_SITE_OTHER): Payer: 59 | Admitting: Nurse Practitioner

## 2015-12-08 ENCOUNTER — Encounter: Payer: Self-pay | Admitting: Nurse Practitioner

## 2015-12-08 VITALS — BP 136/76 | HR 86 | Temp 97.1°F | Ht 64.0 in | Wt 220.0 lb

## 2015-12-08 DIAGNOSIS — I159 Secondary hypertension, unspecified: Secondary | ICD-10-CM | POA: Diagnosis not present

## 2015-12-08 DIAGNOSIS — J449 Chronic obstructive pulmonary disease, unspecified: Secondary | ICD-10-CM | POA: Diagnosis not present

## 2015-12-08 DIAGNOSIS — E119 Type 2 diabetes mellitus without complications: Secondary | ICD-10-CM | POA: Diagnosis not present

## 2015-12-08 DIAGNOSIS — Z6838 Body mass index (BMI) 38.0-38.9, adult: Secondary | ICD-10-CM | POA: Diagnosis not present

## 2015-12-08 DIAGNOSIS — E785 Hyperlipidemia, unspecified: Secondary | ICD-10-CM

## 2015-12-08 DIAGNOSIS — F411 Generalized anxiety disorder: Secondary | ICD-10-CM

## 2015-12-08 DIAGNOSIS — R609 Edema, unspecified: Secondary | ICD-10-CM

## 2015-12-08 DIAGNOSIS — E559 Vitamin D deficiency, unspecified: Secondary | ICD-10-CM

## 2015-12-08 DIAGNOSIS — R600 Localized edema: Secondary | ICD-10-CM

## 2015-12-08 DIAGNOSIS — IMO0001 Reserved for inherently not codable concepts without codable children: Secondary | ICD-10-CM

## 2015-12-08 LAB — CMP14+EGFR
A/G RATIO: 1.9 (ref 1.2–2.2)
ALT: 25 IU/L (ref 0–32)
AST: 19 IU/L (ref 0–40)
Albumin: 4.1 g/dL (ref 3.5–5.5)
Alkaline Phosphatase: 72 IU/L (ref 39–117)
BILIRUBIN TOTAL: 0.4 mg/dL (ref 0.0–1.2)
BUN/Creatinine Ratio: 14 (ref 9–23)
BUN: 12 mg/dL (ref 6–24)
CHLORIDE: 101 mmol/L (ref 96–106)
CO2: 26 mmol/L (ref 18–29)
Calcium: 9.9 mg/dL (ref 8.7–10.2)
Creatinine, Ser: 0.83 mg/dL (ref 0.57–1.00)
GFR, EST AFRICAN AMERICAN: 89 mL/min/{1.73_m2} (ref >59)
GFR, EST NON AFRICAN AMERICAN: 77 mL/min/{1.73_m2} (ref >59)
GLOBULIN, TOTAL: 2.2 g/dL (ref 1.5–4.5)
Glucose: 114 mg/dL — ABNORMAL HIGH (ref 65–99)
POTASSIUM: 5.1 mmol/L (ref 3.5–5.2)
SODIUM: 143 mmol/L (ref 134–144)
Total Protein: 6.3 g/dL (ref 6.0–8.5)

## 2015-12-08 LAB — LIPID PANEL
Chol/HDL Ratio: 3 ratio units (ref 0.0–4.4)
Cholesterol, Total: 118 mg/dL (ref 100–199)
HDL: 40 mg/dL (ref >39)
LDL CALC: 37 mg/dL (ref 0–99)
TRIGLYCERIDES: 204 mg/dL — AB (ref 0–149)
VLDL Cholesterol Cal: 41 mg/dL — ABNORMAL HIGH (ref 5–40)

## 2015-12-08 LAB — BAYER DCA HB A1C WAIVED: HB A1C: 7.6 % — AB (ref <7.0)

## 2015-12-08 MED ORDER — METFORMIN HCL 1000 MG PO TABS: 1000.0000 mg | ORAL_TABLET | Freq: Two times a day (BID) | ORAL | Status: DC

## 2015-12-08 MED ORDER — ATORVASTATIN CALCIUM 40 MG PO TABS: ORAL_TABLET | ORAL | Status: DC

## 2015-12-08 MED ORDER — GLIMEPIRIDE 4 MG PO TABS: 4.0000 mg | ORAL_TABLET | Freq: Every day | ORAL | Status: DC

## 2015-12-08 MED ORDER — FUROSEMIDE 20 MG PO TABS: 20.0000 mg | ORAL_TABLET | Freq: Every day | ORAL | Status: DC

## 2015-12-08 MED ORDER — DULAGLUTIDE 1.5 MG/0.5ML ~~LOC~~ SOAJ: 1.5000 mg | SUBCUTANEOUS | Status: DC

## 2015-12-08 MED ORDER — FENOFIBRATE 160 MG PO TABS: 160.0000 mg | ORAL_TABLET | Freq: Every day | ORAL | Status: DC

## 2015-12-08 NOTE — Progress Notes (Signed)
Subjective:    Patient ID: Annette Gibson, female    DOB: [birth year], 59 y.o.   MRN: [id number]  Patient here today for follow up of chronic medical problems. NO complaints today.  Outpatient Encounter Prescriptions as of 12/08/2015  Medication Sig  . albuterol (PROVENTIL HFA;VENTOLIN HFA) 108 (90 BASE) MCG/ACT inhaler Inhale 1 puff into the lungs every 6 (six) hours as needed.  Marland Kitchen atorvastatin (LIPITOR) 40 MG tablet Take 1 tablet (40 mg total) by mouth daily at 6 PM.  . Dulaglutide (TRULICITY) 1.5 HA/1.9FX SOPN Inject 1.5 mg into the skin once a week.  . fenofibrate 160 MG tablet Take 1 tablet (160 mg total) by mouth daily.  . Fluticasone Furoate-Vilanterol (BREO ELLIPTA) 100-25 MCG/INH AEPB INHALE 1 PUFF INTO LUNGS DAILY AS DIRECTED  . furosemide (LASIX) 20 MG tablet Take 1 tablet (20 mg total) by mouth daily.  Marland Kitchen glimepiride (AMARYL) 4 MG tablet Take 1 tablet (4 mg total) by mouth daily before breakfast.  . metFORMIN (GLUCOPHAGE) 1000 MG tablet Take 1 tablet (1,000 mg total) by mouth 2 (two) times daily with a meal.  . ONETOUCH VERIO test strip CHECK BLOOD SUGAR ONCE DAILY OR AS DIRECTED   No facility-administered encounter medications on file as of 12/08/2015.    Diabetes She presents for her follow-up diabetic visit. She has type 2 diabetes mellitus. No MedicAlert identification noted. Her disease course has been stable. Pertinent negatives for diabetes include no polydipsia, no polyphagia, no visual change and no weakness. Symptoms are stable. Risk factors for coronary artery disease include dyslipidemia, family history, diabetes mellitus, hypertension, obesity and post-menopausal. Current diabetic treatment includes oral agent (dual therapy) (Patient has seen clinicalpharmacist 2 x since i saw her and she has been put on trulicity- dose wasincreased 1 onth ago.). Her weight is stable. When asked about meal planning, she reported none. She has not had a previous visit with a dietitian. She  rarely participates in exercise. Her breakfast blood glucose is taken between 8-9 am. Her breakfast blood glucose range is generally 140-180 mg/dl. Her highest blood glucose is >200 mg/dl. Her overall blood glucose range is 140-180 mg/dl. An ACE inhibitor/angiotensin II receptor blocker is not being taken. She does not see a podiatrist.Eye exam is not current.  Hyperlipidemia This is a chronic problem. The current episode started more than 1 year ago. The problem is uncontrolled. Recent lipid tests were reviewed and are high. Exacerbating diseases include diabetes and obesity. She has no history of hypothyroidism. Pertinent negatives include no myalgias. Current antihyperlipidemic treatment includes statins. The current treatment provides moderate improvement of lipids. Compliance problems include adherence to diet and adherence to exercise.  Risk factors for coronary artery disease include diabetes mellitus, dyslipidemia, obesity and post-menopausal.  Hypertension This is a chronic problem. The current episode started more than 1 year ago. The problem is unchanged. The problem is controlled. Risk factors for coronary artery disease include obesity, dyslipidemia, family history and post-menopausal state. Past treatments include diuretics. The current treatment provides moderate improvement. Compliance problems include diet and exercise.   COPD Pt takes Breo that seems to work-likes it much better than the advair. Has albuterol inhaler that she uses twice a week-when she is cleaning the house or working in the yard. Peripheral Edema Pt takes lasix daily- Denies any swelling at this time and is working good. osteopenia Just doing weight bearing exercises GAD Curreently not taking anything- is trying stress management     Review of Systems  Constitutional: Negative.  HENT: Negative.   Respiratory: Negative.   Cardiovascular: Negative.   Gastrointestinal: Negative.   Endocrine: Negative for  polydipsia and polyphagia.  Genitourinary: Negative.   Musculoskeletal: Negative for myalgias.  Neurological: Negative for weakness.  Psychiatric/Behavioral: Negative.   All other systems reviewed and are negative.      Objective:   Physical Exam  Constitutional: She is oriented to person, place, and time. She appears well-developed and well-nourished.  HENT:  Nose: Nose normal.  Mouth/Throat: Oropharynx is clear and moist.  Eyes: EOM are normal.  Neck: Trachea normal, normal range of motion and full passive range of motion without pain. Neck supple. No JVD present. Carotid bruit is not present. No thyromegaly present.  Cardiovascular: Normal rate, regular rhythm, normal heart sounds and intact distal pulses.  Exam reveals no gallop and no friction rub.   No murmur heard. Pulmonary/Chest: Effort normal and breath sounds normal.  Abdominal: Soft. Bowel sounds are normal. She exhibits no distension and no mass. There is no tenderness.  Musculoskeletal: Normal range of motion. She exhibits no edema.  Lymphadenopathy:    She has no cervical adenopathy.  Neurological: She is alert and oriented to person, place, and time. She has normal reflexes.  Skin: Skin is warm and dry.  Psychiatric: She has a normal mood and affect. Her behavior is normal. Judgment and thought content normal.   BP 136/76 mmHg  Pulse 86  Temp(Src) 97.1 F (36.2 C) (Oral)  Ht _0  (1.626 m)  Wt 220 lb (99.791 kg)  BMI 37.74 kg/m2  HGBA1C 7.6%- down from 8.7% last vist  Assessment & Plan:   1. Type 2 diabetes mellitus without complication, without long-term current use of insulin (HCC) Continue to watch carbs - Bayer DCA Hb A1c Waived - metFORMIN (GLUCOPHAGE) 1000 MG tablet; Take 1 tablet (1,000 mg total) by mouth 2 (two) times daily with a meal.  Dispense: 60 tablet; Refill: 5 - glimepiride (AMARYL) 4 MG tablet; Take 1 tablet (4 mg total) by mouth daily before breakfast.  Dispense: 30 tablet; Refill: 5 -  Dulaglutide (TRULICITY) 1.5 EN/4.0HW SOPN; Inject 1.5 mg into the skin once a week.  Dispense: 4 pen; Refill: 2  2. Hyperlipidemia Low fat diet - Lipid panel - fenofibrate 160 MG tablet; Take 1 tablet (160 mg total) by mouth daily.  Dispense: 30 tablet; Refill: 5 - atorvastatin (LIPITOR) 40 MG tablet; Take 1 tablet (40 mg total) by mouth daily at 6 PM.  Dispense: 30 tablet; Refill: 5  3. Secondary hypertension, unspecified Do not add salt to diet - CMP14+EGFR  4. COPD bronchitis Smoking cessation encouraged  5. Peripheral edema Elevate legs when sitting - furosemide (LASIX) 20 MG tablet; Take 1 tablet (20 mg total) by mouth daily.  Dispense: 30 tablet; Refill: 5  6. GAD (generalized anxiety disorder) Stress management  7. Vitamin D deficiency  8. BMI 38.0-38.9,adult Discussed diet and exercise for person with BMI >25 Will recheck weight in 3-6 months    Labs pending Health maintenance reviewed Diet and exercise encouraged Continue all meds Follow up  In 3 month   [place], FNP

## 2015-12-08 NOTE — Patient Instructions (Signed)

## 2016-02-10 ENCOUNTER — Other Ambulatory Visit: Payer: Self-pay | Admitting: Nurse Practitioner

## 2016-02-10 DIAGNOSIS — IMO0001 Reserved for inherently not codable concepts without codable children: Secondary | ICD-10-CM

## 2016-03-22 ENCOUNTER — Encounter: Payer: Self-pay | Admitting: Nurse Practitioner

## 2016-03-22 ENCOUNTER — Ambulatory Visit (INDEPENDENT_AMBULATORY_CARE_PROVIDER_SITE_OTHER): Payer: 59 | Admitting: Nurse Practitioner

## 2016-03-22 VITALS — BP 131/69 | HR 92 | Temp 98.1°F | Ht 64.0 in | Wt 219.0 lb

## 2016-03-22 DIAGNOSIS — E785 Hyperlipidemia, unspecified: Secondary | ICD-10-CM

## 2016-03-22 DIAGNOSIS — R600 Localized edema: Secondary | ICD-10-CM

## 2016-03-22 DIAGNOSIS — F411 Generalized anxiety disorder: Secondary | ICD-10-CM

## 2016-03-22 DIAGNOSIS — E119 Type 2 diabetes mellitus without complications: Secondary | ICD-10-CM | POA: Diagnosis not present

## 2016-03-22 DIAGNOSIS — Z6838 Body mass index (BMI) 38.0-38.9, adult: Secondary | ICD-10-CM

## 2016-03-22 DIAGNOSIS — R609 Edema, unspecified: Secondary | ICD-10-CM

## 2016-03-22 DIAGNOSIS — J411 Mucopurulent chronic bronchitis: Secondary | ICD-10-CM | POA: Diagnosis not present

## 2016-03-22 DIAGNOSIS — I10 Essential (primary) hypertension: Secondary | ICD-10-CM | POA: Diagnosis not present

## 2016-03-22 DIAGNOSIS — E559 Vitamin D deficiency, unspecified: Secondary | ICD-10-CM

## 2016-03-22 DIAGNOSIS — Z23 Encounter for immunization: Secondary | ICD-10-CM | POA: Diagnosis not present

## 2016-03-22 LAB — BAYER DCA HB A1C WAIVED: HB A1C (BAYER DCA - WAIVED): 7.1 % — ABNORMAL HIGH (ref <7.0)

## 2016-03-22 MED ORDER — FLUTICASONE FUROATE-VILANTEROL 100-25 MCG/INH IN AEPB: INHALATION_SPRAY | RESPIRATORY_TRACT | 2 refills | Status: DC

## 2016-03-22 NOTE — Progress Notes (Signed)
Subjective:    Patient ID: Annette Gibson, female    DOB: 1955-08-29, 60 y.o.   MRN: [id number]  Patient here today for follow up of chronic medical problems. NO complaints today.  Outpatient Encounter Prescriptions as of 03/22/2016  Medication Sig  . albuterol (PROVENTIL HFA;VENTOLIN HFA) 108 (90 BASE) MCG/ACT inhaler Inhale 1 puff into the lungs every 6 (six) hours as needed.  Marland Kitchen atorvastatin (LIPITOR) 40 MG tablet Take 1 tablet (40 mg total) by mouth daily at 6 PM.  . BREO ELLIPTA 100-25 MCG/INH AEPB INHALE 1 PUFF INTO LUNGS DAILY AS DIRECTED  . Dulaglutide (TRULICITY) 1.5 HR/4.1UL SOPN Inject 1.5 mg into the skin once a week.  . fenofibrate 160 MG tablet Take 1 tablet (160 mg total) by mouth daily.  . furosemide (LASIX) 20 MG tablet Take 1 tablet (20 mg total) by mouth daily.  Marland Kitchen glimepiride (AMARYL) 4 MG tablet Take 1 tablet (4 mg total) by mouth daily before breakfast.  . metFORMIN (GLUCOPHAGE) 1000 MG tablet Take 1 tablet (1,000 mg total) by mouth 2 (two) times daily with a meal.  . ONETOUCH VERIO test strip CHECK BLOOD SUGAR ONCE DAILY OR AS DIRECTED   No facility-administered encounter medications on file as of 03/22/2016.     Diabetes  She presents for her follow-up diabetic visit. She has type 2 diabetes mellitus. No MedicAlert identification noted. Her disease course has been stable. Pertinent negatives for diabetes include no polydipsia, no polyphagia, no visual change and no weakness. Symptoms are stable. Risk factors for coronary artery disease include dyslipidemia, family history, diabetes mellitus, hypertension, obesity and post-menopausal. Current diabetic treatment includes oral agent (dual therapy) (Patient has seen clinicalpharmacist 2 x since i saw her and she has been put on trulicity- dose wasincreased 1 onth ago.). Her weight is stable. When asked about meal planning, she reported none. She has not had a previous visit with a dietitian. She rarely participates in  exercise. Home blood sugar record trend: does not check blood sugars everyday. Her breakfast blood glucose is taken between 8-9 am. Her breakfast blood glucose range is generally 130-140 mg/dl. Her highest blood glucose is 180-200 mg/dl. Her overall blood glucose range is 140-180 mg/dl. An ACE inhibitor/angiotensin II receptor blocker is not being taken. She does not see a podiatrist.Eye exam is not current.  Hyperlipidemia  This is a chronic problem. The current episode started more than 1 year ago. The problem is uncontrolled. Recent lipid tests were reviewed and are high. Exacerbating diseases include diabetes and obesity. She has no history of hypothyroidism. Pertinent negatives include no myalgias. Current antihyperlipidemic treatment includes statins. The current treatment provides moderate improvement of lipids. Compliance problems include adherence to diet and adherence to exercise.  Risk factors for coronary artery disease include diabetes mellitus, dyslipidemia, obesity and post-menopausal.  Hypertension  This is a chronic problem. The current episode started more than 1 year ago. The problem is unchanged. The problem is controlled. Risk factors for coronary artery disease include obesity, dyslipidemia, family history and post-menopausal state. Past treatments include diuretics. The current treatment provides moderate improvement. Compliance problems include diet and exercise.   COPD Pt takes Breo that seems to work-likes it much better than the advair. Has albuterol inhaler that she uses twice a week-when she is cleaning the house or working in the yard. Peripheral Edema Pt takes lasix daily- Denies any swelling at this time and is working good. osteopenia Just doing weight bearing exercises GAD Curreently not taking anything- is  trying stress management     Review of Systems  Constitutional: Negative.   HENT: Negative.   Respiratory: Negative.   Cardiovascular: Negative.    Gastrointestinal: Negative.   Endocrine: Negative for polydipsia and polyphagia.  Genitourinary: Negative.   Musculoskeletal: Negative for myalgias.  Neurological: Negative for weakness.  Psychiatric/Behavioral: Negative.   All other systems reviewed and are negative.      Objective:   Physical Exam  Constitutional: She is oriented to person, place, and time. She appears well-developed and well-nourished.  HENT:  Nose: Nose normal.  Mouth/Throat: Oropharynx is clear and moist.  Eyes: EOM are normal.  Neck: Trachea normal, normal range of motion and full passive range of motion without pain. Neck supple. No JVD present. Carotid bruit is not present. No thyromegaly present.  Cardiovascular: Normal rate, regular rhythm, normal heart sounds and intact distal pulses.  Exam reveals no gallop and no friction rub.   No murmur heard. Pulmonary/Chest: Effort normal and breath sounds normal.  Abdominal: Soft. Bowel sounds are normal. She exhibits no distension and no mass. There is no tenderness.  Musculoskeletal: Normal range of motion. She exhibits no edema.  Lymphadenopathy:    She has no cervical adenopathy.  Neurological: She is alert and oriented to person, place, and time. She has normal reflexes.  Skin: Skin is warm and dry.  Psychiatric: She has a normal mood and affect. Her behavior is normal. Judgment and thought content normal.   BP 131/69   Pulse 92   Temp 98.1 F (36.7 C) (Oral)   Ht _0  (1.626 m)   Wt 219 lb (99.3 kg)   BMI 37.59 kg/m    HGBA1C 7.1% down from 7.6% at last visit  Assessment & Plan:  1. Type 2 diabetes mellitus without complication, without long-term current use of insulin (HCC) Continue to watch carbs in diet - Bayer DCA Hb A1c Waived  2. Hyperlipidemia, unspecified hyperlipidemia type Low fat diet - Lipid panel  3. Essential hypertension Do not add salt to diet - CMP14+EGFR  4. Mucopurulent chronic bronchitis (HCC) Smoking cessation  encouraged - fluticasone furoate-vilanterol (BREO ELLIPTA) 100-25 MCG/INH AEPB; INHALE 1 PUFF INTO LUNGS DAILY AS DIRECTED  Dispense: 60 each; Refill: 2  5. BMI 38.0-38.9,adult Discussed diet and exercise for person with BMI >25 Will recheck weight in 3-6 months  6. GAD (generalized anxiety disorder) Stress manageement  7. Peripheral edema Elevate legs when sititng  8. Vitamin D deficiency    Labs pending Health maintenance reviewed Diet and exercise encouraged Continue all meds Follow up  In 3 months   [place], FNP

## 2016-03-22 NOTE — Patient Instructions (Signed)
Smoking Cessation, Tips for Success If you are ready to quit smoking, congratulations! You have chosen to help yourself be healthier. Cigarettes bring nicotine, tar, carbon monoxide, and other irritants into your body. Your lungs, heart, and blood vessels will be able to work better without these poisons. There are many different ways to quit smoking. Nicotine gum, nicotine patches, a nicotine inhaler, or nicotine nasal spray can help with physical craving. Hypnosis, support groups, and medicines help break the habit of smoking. WHAT THINGS CAN I DO TO MAKE QUITTING EASIER?  Here are some tips to help you quit for good:  Pick a date when you will quit smoking completely. Tell all of your friends and family about your plan to quit on that date.  Do not try to slowly cut down on the number of cigarettes you are smoking. Pick a quit date and quit smoking completely starting on that day.  Throw away all cigarettes.   Clean and remove all ashtrays from your home, work, and car.  On a card, write down your reasons for quitting. Carry the card with you and read it when you get the urge to smoke.  Cleanse your body of nicotine. Drink enough water and fluids to keep your urine clear or pale yellow. Do this after quitting to flush the nicotine from your body.  Learn to predict your moods. Do not let a bad situation be your excuse to have a cigarette. Some situations in your life might tempt you into wanting a cigarette.  Never have "just one" cigarette. It leads to wanting another and another. Remind yourself of your decision to quit.  Change habits associated with smoking. If you smoked while driving or when feeling stressed, try other activities to replace smoking. Stand up when drinking your coffee. Brush your teeth after eating. Sit in a different chair when you read the paper. Avoid alcohol while trying to quit, and try to drink fewer caffeinated beverages. Alcohol and caffeine may urge you to  smoke.  Avoid foods and drinks that can trigger a desire to smoke, such as sugary or spicy foods and alcohol.  Ask people who smoke not to smoke around you.  Have something planned to do right after eating or having a cup of coffee. For example, plan to take a walk or exercise.  Try a relaxation exercise to calm you down and decrease your stress. Remember, you may be tense and nervous for the first 2 weeks after you quit, but this will pass.  Find new activities to keep your hands busy. Play with a pen, coin, or rubber band. Doodle or draw things on paper.  Brush your teeth right after eating. This will help cut down on the craving for the taste of tobacco after meals. You can also try mouthwash.   Use oral substitutes in place of cigarettes. Try using lemon drops, carrots, cinnamon sticks, or chewing gum. Keep them handy so they are available when you have the urge to smoke.  When you have the urge to smoke, try deep breathing.  Designate your home as a nonsmoking area.  If you are a heavy smoker, ask your health care provider about a prescription for nicotine chewing gum. It can ease your withdrawal from nicotine.  Reward yourself. Set aside the cigarette money you save and buy yourself something nice.  Look for support from others. Join a support group or smoking cessation program. Ask someone at home or at work to help you with your plan   to quit smoking.  Always ask yourself, "Do I need this cigarette or is this just a reflex?" Tell yourself, "Today, I choose not to smoke," or "I do not want to smoke." You are reminding yourself of your decision to quit.  Do not replace cigarette smoking with electronic cigarettes (commonly called e-cigarettes). The safety of e-cigarettes is unknown, and some may contain harmful chemicals.  If you relapse, do not give up! Plan ahead and think about what you will do the next time you get the urge to smoke. HOW WILL I FEEL WHEN I QUIT SMOKING? You  may have symptoms of withdrawal because your body is used to nicotine (the addictive substance in cigarettes). You may crave cigarettes, be irritable, feel very hungry, cough often, get headaches, or have difficulty concentrating. The withdrawal symptoms are only temporary. They are strongest when you first quit but will go away within 10-14 days. When withdrawal symptoms occur, stay in control. Think about your reasons for quitting. Remind yourself that these are signs that your body is healing and getting used to being without cigarettes. Remember that withdrawal symptoms are easier to treat than the major diseases that smoking can cause.  Even after the withdrawal is over, expect periodic urges to smoke. However, these cravings are generally short lived and will go away whether you smoke or not. Do not smoke! WHAT RESOURCES ARE AVAILABLE TO HELP ME QUIT SMOKING? Your health care provider can direct you to community resources or hospitals for support, which may include:  Group support.  Education.  Hypnosis.  Therapy.   This information is not intended to replace advice given to you by your health care provider. Make sure you discuss any questions you have with your health care provider.   Document Released: 02/16/2004 Document Revised: 06/10/2014 Document Reviewed: 11/05/2012 Elsevier Interactive Patient Education [employer].

## 2016-03-23 LAB — CMP14+EGFR
ALBUMIN: 4 g/dL (ref 3.5–5.5)
ALT: 30 IU/L (ref 0–32)
AST: 22 IU/L (ref 0–40)
Albumin/Globulin Ratio: 1.6 (ref 1.2–2.2)
Alkaline Phosphatase: 75 IU/L (ref 39–117)
BUN / CREAT RATIO: 18 (ref 9–23)
BUN: 14 mg/dL (ref 6–24)
Bilirubin Total: 0.2 mg/dL (ref 0.0–1.2)
CALCIUM: 9.6 mg/dL (ref 8.7–10.2)
CO2: 30 mmol/L — AB (ref 18–29)
CREATININE: 0.78 mg/dL (ref 0.57–1.00)
Chloride: 97 mmol/L (ref 96–106)
GFR, EST AFRICAN AMERICAN: 96 mL/min/{1.73_m2} (ref >59)
GFR, EST NON AFRICAN AMERICAN: 83 mL/min/{1.73_m2} (ref >59)
GLOBULIN, TOTAL: 2.5 g/dL (ref 1.5–4.5)
Glucose: 175 mg/dL — ABNORMAL HIGH (ref 65–99)
Potassium: 4.4 mmol/L (ref 3.5–5.2)
SODIUM: 141 mmol/L (ref 134–144)
TOTAL PROTEIN: 6.5 g/dL (ref 6.0–8.5)

## 2016-03-23 LAB — LIPID PANEL
CHOL/HDL RATIO: 3 ratio (ref 0.0–4.4)
Cholesterol, Total: 118 mg/dL (ref 100–199)
HDL: 39 mg/dL — ABNORMAL LOW (ref >39)
LDL CALC: 37 mg/dL (ref 0–99)
TRIGLYCERIDES: 210 mg/dL — AB (ref 0–149)
VLDL Cholesterol Cal: 42 mg/dL — ABNORMAL HIGH (ref 5–40)

## 2016-04-23 ENCOUNTER — Encounter: Payer: Self-pay | Admitting: Nurse Practitioner

## 2016-04-23 ENCOUNTER — Ambulatory Visit (INDEPENDENT_AMBULATORY_CARE_PROVIDER_SITE_OTHER): Payer: 59 | Admitting: Nurse Practitioner

## 2016-04-23 VITALS — BP 123/68 | HR 83 | Temp 97.0°F | Ht 64.0 in | Wt 220.0 lb

## 2016-04-23 DIAGNOSIS — R739 Hyperglycemia, unspecified: Secondary | ICD-10-CM | POA: Diagnosis not present

## 2016-04-23 DIAGNOSIS — Z09 Encounter for follow-up examination after completed treatment for conditions other than malignant neoplasm: Secondary | ICD-10-CM | POA: Diagnosis not present

## 2016-04-23 DIAGNOSIS — J411 Mucopurulent chronic bronchitis: Secondary | ICD-10-CM | POA: Diagnosis not present

## 2016-04-23 MED ORDER — ALBUTEROL SULFATE (2.5 MG/3ML) 0.083% IN NEBU: 2.5000 mg | INHALATION_SOLUTION | Freq: Four times a day (QID) | RESPIRATORY_TRACT | 1 refills | Status: DC | PRN

## 2016-04-23 MED ORDER — FLUTICASONE FUROATE-VILANTEROL 100-25 MCG/INH IN AEPB: INHALATION_SPRAY | RESPIRATORY_TRACT | 2 refills | Status: DC

## 2016-04-23 NOTE — Progress Notes (Signed)
   Subjective:    Patient ID: Mardene Celeste L. Limones, female    DOB: [birth year], 60 y.o.   MRN: [id number]  HPI  Patient in today for hospital follow up- went to [facility] with SOB on 04/17/16. Was diagnosed with bronchospasms. SHe was given several breathing treatments as well as CPAP machine and antibiotics once admitted- she was discharged yesterday. Her only complaint today is her blood sugars being out of whack. Blood sugars were high wnet she went to er and while in hospital. They are telling her that steroids are what is keeping  Her blood sugars elevated. Started smoking as son as she left the hospital.   Review of Systems  Constitutional: Negative.   HENT: Negative.   Respiratory: Positive for cough.   Cardiovascular: Negative.   Genitourinary: Negative.   Neurological: Negative.   Psychiatric/Behavioral: Negative.   All other systems reviewed and are negative.      Objective:   Physical Exam  Constitutional: She is oriented to person, place, and time. She appears well-developed and well-nourished. No distress.  Cardiovascular: Normal rate, regular rhythm and normal heart sounds.   Pulmonary/Chest: Effort normal. She has wheezes (tight expiratory wheezes).  Neurological: She is alert and oriented to person, place, and time.  Skin: Skin is warm.  Psychiatric: She has a normal mood and affect. Her behavior is normal. Judgment and thought content normal.   BP 123/68   Pulse 83   Temp 97 F (36.1 C) (Oral)   Ht _0  (1.626 m)   Wt 220 lb (99.8 kg)   BMI 37.76 kg/m         Assessment & Plan:  1. Mucopurulent chronic bronchitis (HCC) Continue albuterol as needed Taper down off prednisone as rx encourgaed to stop smoking but patient just not ready - fluticasone furoate-vilanterol (BREO ELLIPTA) 100-25 MCG/INH AEPB; INHALE 1 PUFF INTO LUNGS DAILY AS DIRECTED  Dispense: 60 each; Refill: 2 - albuterol (PROVENTIL) (2.5 MG/3ML) 0.083% nebulizer solution; Take 3 mLs (2.5  mg total) by nebulization every 6 (six) hours as needed for wheezing or shortness of breath.  Dispense: 150 mL; Refill: 1  2. Hospital discharge follow-up hospital records reviewed  3. Hyperglycemia Strict low carb diet Refuses to change medications or take more shots- did agree to keep check of blood sugars and come back if stays above 300 consistently. Discussed dangers of this with patient  Mary-Margaret Hassell Done, FNP

## 2016-04-23 NOTE — Patient Instructions (Signed)
Carbohydrate Counting for Diabetes Mellitus, Adult Carbohydrate counting is a method for keeping track of how many carbohydrates you eat. Eating carbohydrates naturally increases the amount of sugar (glucose) in the blood. Counting how many carbohydrates you eat helps keep your blood glucose within normal limits, which helps you manage your diabetes (diabetes mellitus). It is important to know how many carbohydrates you can safely have in each meal. This is different for every person. A diet and nutrition specialist (registered dietitian) can help you make a meal plan and calculate how many carbohydrates you should have at each meal and snack. Carbohydrates are found in the following foods:  Grains, such as breads and cereals.  Dried beans and soy products.  Starchy vegetables, such as potatoes, peas, and corn.  Fruit and fruit juices.  Milk and yogurt.  Sweets and snack foods, such as cake, cookies, candy, chips, and soft drinks. How do I count carbohydrates? There are two ways to count carbohydrates in food. You can use either of the methods or a combination of both. Reading "Nutrition Facts" on packaged food  The "Nutrition Facts" list is included on the labels of almost all packaged foods and beverages in the [place] It includes:  The serving size.  Information about nutrients in each serving, including the grams (g) of carbohydrate per serving. To use the "Nutrition Facts":  Decide how many servings you will have.  Multiply the number of servings by the number of carbohydrates per serving.  The resulting number is the total amount of carbohydrates that you will be having. Learning standard serving sizes of other foods  When you eat foods containing carbohydrates that are not packaged or do not include "Nutrition Facts" on the label, you need to measure the servings in order to count the amount of carbohydrates:  Measure the foods that you will eat with a food scale or measuring  cup, if needed.  Decide how many standard-size servings you will eat.  Multiply the number of servings by 15. Most carbohydrate-rich foods have about 15 g of carbohydrates per serving.  For example, if you eat 8 oz (170 g) of strawberries, you will have eaten 2 servings and 30 g of carbohydrates (2 servings x 15 g = 30 g).  For foods that have more than one food mixed, such as soups and casseroles, you must count the carbohydrates in each food that is included. The following list contains standard serving sizes of common carbohydrate-rich foods. Each of these servings has about 15 g of carbohydrates:   hamburger bun or  English muffin.   oz (15 mL) syrup.   oz (14 g) jelly.  1 slice of bread.  1 six-inch tortilla.  3 oz (85 g) cooked rice or pasta.  4 oz (113 g) cooked dried beans.  4 oz (113 g) starchy vegetable, such as peas, corn, or potatoes.  4 oz (113 g) hot cereal.  4 oz (113 g) mashed potatoes or  of a large baked potato.  4 oz (113 g) canned or frozen fruit.  4 oz (120 mL) fruit juice.  4-6 crackers.  6 chicken nuggets.  6 oz (170 g) unsweetened dry cereal.  6 oz (170 g) plain fat-free yogurt or yogurt sweetened with artificial sweeteners.  8 oz (240 mL) milk.  8 oz (170 g) fresh fruit or one small piece of fruit.  24 oz (680 g) popped popcorn. Example of carbohydrate counting Sample meal  3 oz (85 g) chicken breast.  6 oz (  170 g) brown rice.  4 oz (113 g) corn.  8 oz (240 mL) milk.  8 oz (170 g) strawberries with sugar-free whipped topping. Carbohydrate calculation 1. Identify the foods that contain carbohydrates:  Rice.  Corn.  Milk.  Strawberries. 2. Calculate how many servings you have of each food:  2 servings rice.  1 serving corn.  1 serving milk.  1 serving strawberries. 3. Multiply each number of servings by 15 g:  2 servings rice x 15 g = 30 g.  1 serving corn x 15 g = 15 g.  1 serving milk x 15 g = 15  g.  1 serving strawberries x 15 g = 15 g. 4. Add together all of the amounts to find the total grams of carbohydrates eaten:  30 g + 15 g + 15 g + 15 g = 75 g of carbohydrates total. This information is not intended to replace advice given to you by your health care provider. Make sure you discuss any questions you have with your health care provider. Document Released: 05/20/2005 Document Revised: 12/08/2015 Document Reviewed: 11/01/2015 Elsevier Interactive Patient Education  2017 [employer].

## 2016-06-21 ENCOUNTER — Encounter: Payer: Self-pay | Admitting: Nurse Practitioner

## 2016-06-21 ENCOUNTER — Ambulatory Visit (INDEPENDENT_AMBULATORY_CARE_PROVIDER_SITE_OTHER): Payer: 59 | Admitting: Nurse Practitioner

## 2016-06-21 VITALS — BP 127/66 | HR 93 | Temp 97.1°F | Ht 64.0 in | Wt 212.0 lb

## 2016-06-21 DIAGNOSIS — E782 Mixed hyperlipidemia: Secondary | ICD-10-CM

## 2016-06-21 DIAGNOSIS — E119 Type 2 diabetes mellitus without complications: Secondary | ICD-10-CM | POA: Diagnosis not present

## 2016-06-21 DIAGNOSIS — E559 Vitamin D deficiency, unspecified: Secondary | ICD-10-CM

## 2016-06-21 DIAGNOSIS — M8580 Other specified disorders of bone density and structure, unspecified site: Secondary | ICD-10-CM

## 2016-06-21 DIAGNOSIS — Z6838 Body mass index (BMI) 38.0-38.9, adult: Secondary | ICD-10-CM | POA: Diagnosis not present

## 2016-06-21 DIAGNOSIS — F411 Generalized anxiety disorder: Secondary | ICD-10-CM

## 2016-06-21 DIAGNOSIS — I10 Essential (primary) hypertension: Secondary | ICD-10-CM | POA: Diagnosis not present

## 2016-06-21 DIAGNOSIS — E785 Hyperlipidemia, unspecified: Secondary | ICD-10-CM | POA: Diagnosis not present

## 2016-06-21 DIAGNOSIS — J209 Acute bronchitis, unspecified: Secondary | ICD-10-CM | POA: Diagnosis not present

## 2016-06-21 DIAGNOSIS — R609 Edema, unspecified: Secondary | ICD-10-CM | POA: Diagnosis not present

## 2016-06-21 LAB — BAYER DCA HB A1C WAIVED: HB A1C: 7.7 % — AB (ref <7.0)

## 2016-06-21 MED ORDER — METFORMIN HCL 1000 MG PO TABS: 1000.0000 mg | ORAL_TABLET | Freq: Two times a day (BID) | ORAL | 5 refills | Status: DC

## 2016-06-21 MED ORDER — DULAGLUTIDE 1.5 MG/0.5ML ~~LOC~~ SOAJ
1.5000 mg | SUBCUTANEOUS | 2 refills | Status: DC
Start: 2016-06-21 — End: 2016-09-20

## 2016-06-21 MED ORDER — AZITHROMYCIN 250 MG PO TABS: ORAL_TABLET | ORAL | 0 refills | Status: DC

## 2016-06-21 MED ORDER — ATORVASTATIN CALCIUM 40 MG PO TABS: ORAL_TABLET | ORAL | 5 refills | Status: DC

## 2016-06-21 MED ORDER — FUROSEMIDE 20 MG PO TABS: 20.0000 mg | ORAL_TABLET | Freq: Every day | ORAL | 5 refills | Status: DC

## 2016-06-21 MED ORDER — FENOFIBRATE 160 MG PO TABS: 160.0000 mg | ORAL_TABLET | Freq: Every day | ORAL | 5 refills | Status: DC

## 2016-06-21 NOTE — Progress Notes (Signed)
Subjective:    Patient ID: Annette Gibson, female    DOB: 10/18/1955, 61 y.o.   MRN: [id number]  Patient here today for follow up of chronic medical problems. NO complaints today. Patient last visit was on 04/23/16 for hospital follow up form bronchospasms- she is doing well today with no complaints of breathing problems.  Outpatient Encounter Prescriptions as of 06/21/2016  Medication Sig  . albuterol (PROVENTIL HFA;VENTOLIN HFA) 108 (90 BASE) MCG/ACT inhaler Inhale 1 puff into the lungs every 6 (six) hours as needed.  Marland Kitchen albuterol (PROVENTIL) (2.5 MG/3ML) 0.083% nebulizer solution Take 3 mLs (2.5 mg total) by nebulization every 6 (six) hours as needed for wheezing or shortness of breath.  Marland Kitchen atorvastatin (LIPITOR) 40 MG tablet Take 1 tablet (40 mg total) by mouth daily at 6 PM.  . Dulaglutide (TRULICITY) 1.5 DV/7.6HY SOPN Inject 1.5 mg into the skin once a week.  . fenofibrate 160 MG tablet Take 1 tablet (160 mg total) by mouth daily.  . fluticasone furoate-vilanterol (BREO ELLIPTA) 100-25 MCG/INH AEPB INHALE 1 PUFF INTO LUNGS DAILY AS DIRECTED  . furosemide (LASIX) 20 MG tablet Take 1 tablet (20 mg total) by mouth daily.  . metFORMIN (GLUCOPHAGE) 1000 MG tablet Take 1 tablet (1,000 mg total) by mouth 2 (two) times daily with a meal.  . ONETOUCH VERIO test strip CHECK BLOOD SUGAR ONCE DAILY OR AS DIRECTED   No facility-administered encounter medications on file as of 06/21/2016.    * patient c/o cough and congestion thatt has been going on for 2 weeks. She denies SOB or fever.  Diabetes  She presents for her follow-up diabetic visit. She has type 2 diabetes mellitus. No MedicAlert identification noted. Her disease course has been stable. Pertinent negatives for diabetes include no polydipsia, no polyphagia, no visual change and no weakness. Symptoms are stable. Risk factors for coronary artery disease include dyslipidemia, family history, diabetes mellitus, hypertension, obesity and  post-menopausal. Current diabetic treatment includes oral agent (dual therapy) (Patient has seen clinicalpharmacist 2 x since i saw her and she has been put on trulicity- dose wasincreased 1 onth ago.). Her weight is stable. When asked about meal planning, she reported none. She has not had a previous visit with a dietitian. She rarely participates in exercise. Home blood sugar record trend: does not check blood sugars everyday. Her breakfast blood glucose is taken between 8-9 am. Her breakfast blood glucose range is generally 130-140 mg/dl. Her highest blood glucose is 180-200 mg/dl. Her overall blood glucose range is 140-180 mg/dl. An ACE inhibitor/angiotensin II receptor blocker is not being taken. She does not see a podiatrist.Eye exam is not current.  Hyperlipidemia  This is a chronic problem. The current episode started more than 1 year ago. The problem is uncontrolled. Recent lipid tests were reviewed and are high. Exacerbating diseases include diabetes and obesity. She has no history of hypothyroidism. Pertinent negatives include no myalgias. Current antihyperlipidemic treatment includes statins. The current treatment provides moderate improvement of lipids. Compliance problems include adherence to diet and adherence to exercise.  Risk factors for coronary artery disease include diabetes mellitus, dyslipidemia, obesity and post-menopausal.  Hypertension  This is a chronic problem. The current episode started more than 1 year ago. The problem is unchanged. The problem is controlled. Risk factors for coronary artery disease include obesity, dyslipidemia, family history and post-menopausal state. Past treatments include diuretics. The current treatment provides moderate improvement. Compliance problems include diet and exercise.   COPD Pt takes Memory Dance that seems  to work-likes it much better than the advair. Has albuterol inhaler that she uses twice a week-when she is cleaning the house or working in the  yard. Peripheral Edema Pt takes lasix daily- Denies any swelling at this time and is working good. osteopenia Just doing weight bearing exercises GAD Curreently not taking anything- is trying stress management Vitamin d def Currently not taking anything- need to recheck labs today.   Review of Systems  Constitutional: Negative.   HENT: Negative.   Respiratory: Negative.   Cardiovascular: Negative.   Gastrointestinal: Negative.   Endocrine: Negative for polydipsia and polyphagia.  Genitourinary: Negative.   Musculoskeletal: Negative for myalgias.  Neurological: Negative for weakness.  Psychiatric/Behavioral: Negative.   All other systems reviewed and are negative.      Objective:   Physical Exam  Constitutional: She is oriented to person, place, and time. She appears well-developed and well-nourished.  HENT:  Right Ear: External ear normal.  Left Ear: External ear normal.  Nose: Mucosal edema and rhinorrhea present.  Mouth/Throat: Oropharynx is clear and moist.  Eyes: EOM are normal.  Neck: Trachea normal, normal range of motion and full passive range of motion without pain. Neck supple. No JVD present. Carotid bruit is not present. No thyromegaly present.  Cardiovascular: Normal rate, regular rhythm, normal heart sounds and intact distal pulses.  Exam reveals no gallop and no friction rub.   No murmur heard. Pulmonary/Chest: Effort normal and breath sounds normal.  Deep dry cough  Abdominal: Soft. Bowel sounds are normal. She exhibits no distension and no mass. There is no tenderness.  Musculoskeletal: Normal range of motion. She exhibits no edema.  Lymphadenopathy:    She has no cervical adenopathy.  Neurological: She is alert and oriented to person, place, and time. She has normal reflexes.  Skin: Skin is warm and dry.  Psychiatric: She has a normal mood and affect. Her behavior is normal. Judgment and thought content normal.   BP 127/66   Pulse 93   Temp 97.1 F  (36.2 C) (Oral)   Ht _0  (1.626 m)   Wt 212 lb (96.2 kg)   BMI 36.39 kg/m   HGBA1C 7.7 up form 7.1% at last visit  Assessment & Plan:  1. Type 2 diabetes mellitus without complication, without long-term current use of insulin (HCC) Stricter carb counting No change to med stoday- blood sugars may have been elevated due to being on steroid- will recheck in 3 months - Bayer DCA Hb A1c Waived - metFORMIN (GLUCOPHAGE) 1000 MG tablet; Take 1 tablet (1,000 mg total) by mouth 2 (two) times daily with a meal.  Dispense: 60 tablet; Refill: 5 - Dulaglutide (TRULICITY) 1.5 JK/0.9FG SOPN; Inject 1.5 mg into the skin once a week.  Dispense: 4 pen; Refill: 2  2. Hyperlipidemia, unspecified hyperlipidemia type Low fat diet - Lipid panel  3. Essential hypertension Low sodium diet - CMP14+EGFR  4. Mixed hyperlipidemia Low fat diet - fenofibrate 160 MG tablet; Take 1 tablet (160 mg total) by mouth daily.  Dispense: 30 tablet; Refill: 5 - atorvastatin (LIPITOR) 40 MG tablet; Take 1 tablet (40 mg total) by mouth daily at 6 PM.  Dispense: 30 tablet; Refill: 5  5. Peripheral edema elevtae legs when sitting - furosemide (LASIX) 20 MG tablet; Take 1 tablet (20 mg total) by mouth daily.  Dispense: 30 tablet; Refill: 5  6. Osteopenia, unspecified location Weigh bearing exercises  7. GAD (generalized anxiety disorder) stres management  8. BMI 38.0-38.9,adult Discussed diet and  exercise for person with BMI >25 Will recheck weight in 3-6 months  9. Vitamin D deficiency - VITAMIN D 25 Hydroxy (Vit-D Deficiency, Fractures)  10. Acute bronchitis, unspecified organism 1. Take meds as prescribed 2. Use a cool mist humidifier especially during the winter months and when heat has been humid. 3. Use saline nose sprays frequently 4. Saline irrigations of the nose can be very helpful if done frequently.  * 4X daily for 1 week*  * Use of a nettie pot can be helpful with this. Follow directions with  this* 5. Drink plenty of fluids 6. Keep thermostat turn down low 7.For any cough or congestion  Use plain Mucinex- regular strength or max strength is fine   * Children- consult with Pharmacist for dosing 8. For fever or aces or pains- take tylenol or ibuprofen appropriate for age and weight.  * for fevers greater than 101 orally you may alternate ibuprofen and tylenol every  3 hours.   - azithromycin (ZITHROMAX Z-PAK) 250 MG tablet; As directed  Dispense: 6 tablet; Refill: 0    Labs pending Health maintenance reviewed Diet and exercise encouraged Continue all meds Follow up  In 3 months   [place], FNP

## 2016-06-21 NOTE — Patient Instructions (Signed)
Carbohydrate Counting for Diabetes Mellitus, Adult Carbohydrate counting is a method for keeping track of how many carbohydrates you eat. Eating carbohydrates naturally increases the amount of sugar (glucose) in the blood. Counting how many carbohydrates you eat helps keep your blood glucose within normal limits, which helps you manage your diabetes (diabetes mellitus). It is important to know how many carbohydrates you can safely have in each meal. This is different for every person. A diet and nutrition specialist (registered dietitian) can help you make a meal plan and calculate how many carbohydrates you should have at each meal and snack. Carbohydrates are found in the following foods:  Grains, such as breads and cereals.  Dried beans and soy products.  Starchy vegetables, such as potatoes, peas, and corn.  Fruit and fruit juices.  Milk and yogurt.  Sweets and snack foods, such as cake, cookies, candy, chips, and soft drinks. How do I count carbohydrates? There are two ways to count carbohydrates in food. You can use either of the methods or a combination of both. Reading "Nutrition Facts" on packaged food  The "Nutrition Facts" list is included on the labels of almost all packaged foods and beverages in the [place] It includes:  The serving size.  Information about nutrients in each serving, including the grams (g) of carbohydrate per serving. To use the "Nutrition Facts":  Decide how many servings you will have.  Multiply the number of servings by the number of carbohydrates per serving.  The resulting number is the total amount of carbohydrates that you will be having. Learning standard serving sizes of other foods  When you eat foods containing carbohydrates that are not packaged or do not include "Nutrition Facts" on the label, you need to measure the servings in order to count the amount of carbohydrates:  Measure the foods that you will eat with a food scale or measuring  cup, if needed.  Decide how many standard-size servings you will eat.  Multiply the number of servings by 15. Most carbohydrate-rich foods have about 15 g of carbohydrates per serving.  For example, if you eat 8 oz (170 g) of strawberries, you will have eaten 2 servings and 30 g of carbohydrates (2 servings x 15 g = 30 g).  For foods that have more than one food mixed, such as soups and casseroles, you must count the carbohydrates in each food that is included. The following list contains standard serving sizes of common carbohydrate-rich foods. Each of these servings has about 15 g of carbohydrates:   hamburger bun or  English muffin.   oz (15 mL) syrup.   oz (14 g) jelly.  1 slice of bread.  1 six-inch tortilla.  3 oz (85 g) cooked rice or pasta.  4 oz (113 g) cooked dried beans.  4 oz (113 g) starchy vegetable, such as peas, corn, or potatoes.  4 oz (113 g) hot cereal.  4 oz (113 g) mashed potatoes or  of a large baked potato.  4 oz (113 g) canned or frozen fruit.  4 oz (120 mL) fruit juice.  4-6 crackers.  6 chicken nuggets.  6 oz (170 g) unsweetened dry cereal.  6 oz (170 g) plain fat-free yogurt or yogurt sweetened with artificial sweeteners.  8 oz (240 mL) milk.  8 oz (170 g) fresh fruit or one small piece of fruit.  24 oz (680 g) popped popcorn. Example of carbohydrate counting Sample meal  3 oz (85 g) chicken breast.  6 oz (  170 g) brown rice.  4 oz (113 g) corn.  8 oz (240 mL) milk.  8 oz (170 g) strawberries with sugar-free whipped topping. Carbohydrate calculation 1. Identify the foods that contain carbohydrates:  Rice.  Corn.  Milk.  Strawberries. 2. Calculate how many servings you have of each food:  2 servings rice.  1 serving corn.  1 serving milk.  1 serving strawberries. 3. Multiply each number of servings by 15 g:  2 servings rice x 15 g = 30 g.  1 serving corn x 15 g = 15 g.  1 serving milk x 15 g = 15  g.  1 serving strawberries x 15 g = 15 g. 4. Add together all of the amounts to find the total grams of carbohydrates eaten:  30 g + 15 g + 15 g + 15 g = 75 g of carbohydrates total. This information is not intended to replace advice given to you by your health care provider. Make sure you discuss any questions you have with your health care provider. Document Released: 05/20/2005 Document Revised: 12/08/2015 Document Reviewed: 11/01/2015 Elsevier Interactive Patient Education  2017 [employer]  

## 2016-06-22 LAB — CMP14+EGFR
ALT: 20 IU/L (ref 0–32)
AST: 16 IU/L (ref 0–40)
Albumin/Globulin Ratio: 1.7 (ref 1.2–2.2)
Albumin: 4 g/dL (ref 3.6–4.8)
Alkaline Phosphatase: 77 IU/L (ref 39–117)
BUN/Creatinine Ratio: 14 (ref 12–28)
BUN: 12 mg/dL (ref 8–27)
Bilirubin Total: 0.3 mg/dL (ref 0.0–1.2)
CALCIUM: 9.7 mg/dL (ref 8.7–10.3)
CO2: 29 mmol/L (ref 18–29)
CREATININE: 0.84 mg/dL (ref 0.57–1.00)
Chloride: 97 mmol/L (ref 96–106)
GFR calc Af Amer: 87 mL/min/{1.73_m2} (ref >59)
GFR, EST NON AFRICAN AMERICAN: 76 mL/min/{1.73_m2} (ref >59)
GLOBULIN, TOTAL: 2.3 g/dL (ref 1.5–4.5)
GLUCOSE: 318 mg/dL — AB (ref 65–99)
Potassium: 4.8 mmol/L (ref 3.5–5.2)
SODIUM: 141 mmol/L (ref 134–144)
Total Protein: 6.3 g/dL (ref 6.0–8.5)

## 2016-06-22 LAB — LIPID PANEL
CHOL/HDL RATIO: 3.4 ratio (ref 0.0–4.4)
Cholesterol, Total: 125 mg/dL (ref 100–199)
HDL: 37 mg/dL — AB (ref >39)
LDL CALC: 31 mg/dL (ref 0–99)
TRIGLYCERIDES: 285 mg/dL — AB (ref 0–149)
VLDL CHOLESTEROL CAL: 57 mg/dL — AB (ref 5–40)

## 2016-06-22 LAB — VITAMIN D 25 HYDROXY (VIT D DEFICIENCY, FRACTURES): VIT D 25 HYDROXY: 40.5 ng/mL (ref 30.0–100.0)

## 2016-07-26 LAB — HM DIABETES EYE EXAM

## 2016-08-02 ENCOUNTER — Other Ambulatory Visit: Payer: Self-pay | Admitting: Nurse Practitioner

## 2016-08-02 DIAGNOSIS — E119 Type 2 diabetes mellitus without complications: Secondary | ICD-10-CM

## 2016-09-20 ENCOUNTER — Ambulatory Visit (INDEPENDENT_AMBULATORY_CARE_PROVIDER_SITE_OTHER): Payer: 59

## 2016-09-20 ENCOUNTER — Encounter (INDEPENDENT_AMBULATORY_CARE_PROVIDER_SITE_OTHER): Payer: Self-pay

## 2016-09-20 ENCOUNTER — Encounter: Payer: Self-pay | Admitting: Nurse Practitioner

## 2016-09-20 ENCOUNTER — Ambulatory Visit (INDEPENDENT_AMBULATORY_CARE_PROVIDER_SITE_OTHER): Payer: 59 | Admitting: Nurse Practitioner

## 2016-09-20 VITALS — BP 130/78 | HR 95 | Temp 96.5°F | Ht 64.0 in | Wt 220.0 lb

## 2016-09-20 DIAGNOSIS — F411 Generalized anxiety disorder: Secondary | ICD-10-CM | POA: Diagnosis not present

## 2016-09-20 DIAGNOSIS — E782 Mixed hyperlipidemia: Secondary | ICD-10-CM

## 2016-09-20 DIAGNOSIS — M25552 Pain in left hip: Secondary | ICD-10-CM

## 2016-09-20 DIAGNOSIS — E119 Type 2 diabetes mellitus without complications: Secondary | ICD-10-CM | POA: Diagnosis not present

## 2016-09-20 DIAGNOSIS — M8580 Other specified disorders of bone density and structure, unspecified site: Secondary | ICD-10-CM

## 2016-09-20 DIAGNOSIS — J411 Mucopurulent chronic bronchitis: Secondary | ICD-10-CM

## 2016-09-20 DIAGNOSIS — E559 Vitamin D deficiency, unspecified: Secondary | ICD-10-CM | POA: Diagnosis not present

## 2016-09-20 DIAGNOSIS — M8588 Other specified disorders of bone density and structure, other site: Secondary | ICD-10-CM | POA: Diagnosis not present

## 2016-09-20 DIAGNOSIS — I10 Essential (primary) hypertension: Secondary | ICD-10-CM

## 2016-09-20 DIAGNOSIS — R609 Edema, unspecified: Secondary | ICD-10-CM

## 2016-09-20 DIAGNOSIS — Z6838 Body mass index (BMI) 38.0-38.9, adult: Secondary | ICD-10-CM | POA: Diagnosis not present

## 2016-09-20 LAB — BAYER DCA HB A1C WAIVED: HB A1C: 7.5 % — AB (ref <7.0)

## 2016-09-20 MED ORDER — METFORMIN HCL 1000 MG PO TABS: 1000.0000 mg | ORAL_TABLET | Freq: Two times a day (BID) | ORAL | 5 refills | Status: DC

## 2016-09-20 MED ORDER — DULAGLUTIDE 1.5 MG/0.5ML ~~LOC~~ SOAJ: 1.5000 mg | SUBCUTANEOUS | 2 refills | Status: DC

## 2016-09-20 MED ORDER — FLUTICASONE FUROATE-VILANTEROL 100-25 MCG/INH IN AEPB: INHALATION_SPRAY | RESPIRATORY_TRACT | 2 refills | Status: DC

## 2016-09-20 MED ORDER — ATORVASTATIN CALCIUM 40 MG PO TABS: ORAL_TABLET | ORAL | 5 refills | Status: DC

## 2016-09-20 MED ORDER — GLIMEPIRIDE 4 MG PO TABS: 4.0000 mg | ORAL_TABLET | Freq: Every day | ORAL | 4 refills | Status: DC

## 2016-09-20 MED ORDER — FENOFIBRATE 160 MG PO TABS: 160.0000 mg | ORAL_TABLET | Freq: Every day | ORAL | 5 refills | Status: DC

## 2016-09-20 NOTE — Patient Instructions (Signed)
Hip Pain The hip is the joint between the upper legs and the lower pelvis. The bones, cartilage, tendons, and muscles of your hip joint support your body and allow you to move around. Hip pain can range from a minor ache to severe pain in one or both of your hips. The pain may be felt on the inside of the hip joint near the groin, or the outside near the buttocks and upper thigh. You may also have swelling or stiffness. Follow these instructions at home: Managing pain, stiffness, and swelling   If directed, apply ice to the injured area.  Put ice in a plastic bag.  Place a towel between your skin and the bag.  Leave the ice on for 20 minutes, 2-3 times a day  Sleep with a pillow between your legs on your most comfortable side.  Avoid any activities that cause pain. General instructions   Take over-the-counter and prescription medicines only as told by your health care provider.  Do any exercises as told by your health care provider.  Record the following:  How often you have hip pain.  The location of your pain.  What the pain feels like.  What makes the pain worse.  Keep all follow-up visits as told by your health care provider. This is important. Contact a health care provider if:  You cannot put weight on your leg.  Your pain or swelling continues or gets worse after one week.  It gets harder to walk.  You have a fever. Get help right away if:  You fall.  You have a sudden increase in pain and swelling in your hip.  Your hip is red or swollen or very tender to touch. Summary  Hip pain can range from a minor ache to severe pain in one or both of your hips.  The pain may be felt on the inside of the hip joint near the groin, or the outside near the buttocks and upper thigh.  Avoid any activities that cause pain.  Record how often you have hip pain, the location of the pain, what makes it worse and what it feels like. This information is not intended to  replace advice given to you by your health care provider. Make sure you discuss any questions you have with your health care provider. Document Released: 11/07/2009 Document Revised: 04/22/2016 Document Reviewed: 04/22/2016 Elsevier Interactive Patient Education  2017 [employer].

## 2016-09-20 NOTE — Progress Notes (Signed)
Subjective:    Patient ID: Annette Gibson, female    DOB: [birth year], 61 y.o.   MRN: [id number]  HPI  Annette Gibson is here today for follow up of chronic medical problem.  Outpatient Encounter Prescriptions as of 09/20/2016  Medication Sig  . albuterol (PROVENTIL HFA;VENTOLIN HFA) 108 (90 BASE) MCG/ACT inhaler Inhale 1 puff into the lungs every 6 (six) hours as needed.  Marland Kitchen albuterol (PROVENTIL) (2.5 MG/3ML) 0.083% nebulizer solution Take 3 mLs (2.5 mg total) by nebulization every 6 (six) hours as needed for wheezing or shortness of breath.  Marland Kitchen atorvastatin (LIPITOR) 40 MG tablet Take 1 tablet (40 mg total) by mouth daily at 6 PM.  . azithromycin (ZITHROMAX Z-PAK) 250 MG tablet As directed  . Dulaglutide (TRULICITY) 1.5 ZR/0.0TM SOPN Inject 1.5 mg into the skin once a week.  . fenofibrate 160 MG tablet Take 1 tablet (160 mg total) by mouth daily.  . fluticasone furoate-vilanterol (BREO ELLIPTA) 100-25 MCG/INH AEPB INHALE 1 PUFF INTO LUNGS DAILY AS DIRECTED  . furosemide (LASIX) 20 MG tablet Take 1 tablet (20 mg total) by mouth daily.  Marland Kitchen glimepiride (AMARYL) 4 MG tablet Take 1 tablet (4 mg total) by mouth daily before breakfast.  . metFORMIN (GLUCOPHAGE) 1000 MG tablet Take 1 tablet (1,000 mg total) by mouth 2 (two) times daily with a meal.  . ONETOUCH VERIO test strip CHECK BLOOD SUGAR ONCE DAILY OR AS DIRECTED   No facility-administered encounter medications on file as of 09/20/2016.     1. Annual physical exam  Not having today- just wants follow up  2. Gynecologic exam normal  Needs pap but refuses today  3. Type 2 diabetes mellitus without complication, without long-term current use of insulin (HCC)  Fasting blood sugars have been running in 140-160- no c/o hypoglycemia- does not check blood sugars everyday  4. Hyperlipidemia, unspecified hyperlipidemia type  Does not watch diet- nor does she exercise- on statin and fenofibrate without c/o muscle aches  5. Essential  hypertension  No c/o chest pain, SOB or HA- does not check blood pressures at home  6. Mucopurulent chronic bronchitis ([place])  Is on BREO daily- has not needed albuterol in haler lately  7. Osteopenia, unspecified location  No c/o back pain  8. BMI 38.0-38.9,adult  No recent weight gain or weight loss  9. GAD (generalized anxiety disorder)  Stays stressed- not taking anything- says she is okay right now  10. Peripheral edema  Lasix daily- elevates legs when sitting  11. Vitamin D deficiency  Not taking anything right now.    New complaints: Annette Gibson down her steps over a month ago and has been having left hip pain every since.     Review of Systems  Constitutional: Negative.   HENT: Negative.   Respiratory: Negative.   Genitourinary: Negative.   Musculoskeletal: Positive for gait problem (due to left hip pain).  Psychiatric/Behavioral: Negative.   All other systems reviewed and are negative.      Objective:   Physical Exam  Constitutional: She is oriented to person, place, and time. She appears well-developed and well-nourished.  HENT:  Nose: Nose normal.  Mouth/Throat: Oropharynx is clear and moist.  Eyes: EOM are normal.  Neck: Trachea normal, normal range of motion and full passive range of motion without pain. Neck supple. No JVD present. Carotid bruit is not present. No thyromegaly present.  Cardiovascular: Normal rate, regular rhythm, normal heart sounds and intact distal pulses.  Exam reveals no gallop and  no friction rub.   No murmur heard. Pulmonary/Chest: Effort normal. She has wheezes (exp wheezes throughout).  Abdominal: Soft. Bowel sounds are normal. She exhibits no distension and no mass. There is no tenderness.  Musculoskeletal: Normal range of motion.  From of left hip without pain- pain on palpation left buttocks area  Lymphadenopathy:    She has no cervical adenopathy.  Neurological: She is alert and oriented to person, place, and time. She has normal  reflexes.  Skin: Skin is warm and dry.  Psychiatric: She has a normal mood and affect. Her behavior is normal. Judgment and thought content normal.   BP 130/78   Pulse 95   Temp (!) 96.5 F (35.8 C) (Oral)   Ht _0  (1.626 m)   Wt 220 lb (99.8 kg)   BMI 37.76 kg/m   hgba1c 7.5% down from 7.7% at last visit  Left hip x ray- osteo artritis of left hip=Preliminary reading by Annette Collum, FNP  [facility]      Assessment & Plan:  1. Type 2 diabetes mellitus without complication, without long-term current use of insulin (HCC) Stricter carb counting - Bayer DCA Hb A1c Waived - Microalbumin / creatinine urine ratio - Dulaglutide (TRULICITY) 1.5 TT/0.1XB SOPN; Inject 1.5 mg into the skin once a week.  Dispense: 4 pen; Refill: 2 - metFORMIN (GLUCOPHAGE) 1000 MG tablet; Take 1 tablet (1,000 mg total) by mouth 2 (two) times daily with a meal.  Dispense: 60 tablet; Refill: 5 - glimepiride (AMARYL) 4 MG tablet; Take 1 tablet (4 mg total) by mouth daily before breakfast.  Dispense: 30 tablet; Refill: 4  2. Essential hypertension Low sodium diet - CMP14+EGFR  3. Mucopurulent chronic bronchitis (HCC) Smoking cessation encouraged - fluticasone furoate-vilanterol (BREO ELLIPTA) 100-25 MCG/INH AEPB; INHALE 1 PUFF INTO LUNGS DAILY AS DIRECTED  Dispense: 60 each; Refill: 2  4. Osteopenia, unspecified location Weight bearing exercises encouraged- dexa scan today  5. BMI 38.0-38.9,adult Discussed diet and exercise for person with BMI >25 Will recheck weight in 3-6 months  6. GAD (generalized anxiety disorder) Stress management  7. Peripheral edema Elevate legs when sitting  8. Vitamin D deficiency - VITAMIN D 25 Hydroxy (Vit-D Deficiency, Fractures)  9. Mixed hyperlipidemia Low fat diet - Lipid panel - fenofibrate 160 MG tablet; Take 1 tablet (160 mg total) by mouth daily.  Dispense: 30 tablet; Refill: 5 - atorvastatin (LIPITOR) 40 MG tablet; Take 1 tablet (40 mg total) by mouth daily at 6  PM.  Dispense: 30 tablet; Refill: 5  10. Left hip pain continue aleve or tylenol OTC- if worsens let meknow - DG HIP UNILAT W OR W/O PELVIS 2-3 VIEWS LEFT; Future    Labs pending Health maintenance reviewed Diet and exercise encouraged Continue all meds Follow up  In 3 months   [place], FNP

## 2016-09-21 LAB — CMP14+EGFR
A/G RATIO: 1.7 (ref 1.2–2.2)
ALK PHOS: 71 IU/L (ref 39–117)
ALT: 24 IU/L (ref 0–32)
AST: 21 IU/L (ref 0–40)
Albumin: 4 g/dL (ref 3.6–4.8)
BILIRUBIN TOTAL: 0.4 mg/dL (ref 0.0–1.2)
BUN/Creatinine Ratio: 19 (ref 12–28)
BUN: 15 mg/dL (ref 8–27)
CHLORIDE: 97 mmol/L (ref 96–106)
CO2: 31 mmol/L — ABNORMAL HIGH (ref 18–29)
Calcium: 10 mg/dL (ref 8.7–10.3)
Creatinine, Ser: 0.78 mg/dL (ref 0.57–1.00)
GFR calc non Af Amer: 83 mL/min/{1.73_m2} (ref >59)
GFR, EST AFRICAN AMERICAN: 96 mL/min/{1.73_m2} (ref >59)
GLUCOSE: 166 mg/dL — AB (ref 65–99)
Globulin, Total: 2.3 g/dL (ref 1.5–4.5)
POTASSIUM: 4.6 mmol/L (ref 3.5–5.2)
Sodium: 143 mmol/L (ref 134–144)
TOTAL PROTEIN: 6.3 g/dL (ref 6.0–8.5)

## 2016-09-21 LAB — VITAMIN D 25 HYDROXY (VIT D DEFICIENCY, FRACTURES): VIT D 25 HYDROXY: 46.7 ng/mL (ref 30.0–100.0)

## 2016-09-21 LAB — MICROALBUMIN / CREATININE URINE RATIO
CREATININE, UR: 123.7 mg/dL
Microalb/Creat Ratio: 5.5 mg/g creat (ref 0.0–30.0)
Microalbumin, Urine: 6.8 ug/mL

## 2016-09-21 LAB — LIPID PANEL
Chol/HDL Ratio: 2.8 ratio (ref 0.0–4.4)
Cholesterol, Total: 115 mg/dL (ref 100–199)
HDL: 41 mg/dL (ref >39)
LDL Calculated: 39 mg/dL (ref 0–99)
Triglycerides: 175 mg/dL — ABNORMAL HIGH (ref 0–149)
VLDL Cholesterol Cal: 35 mg/dL (ref 5–40)

## 2016-12-27 ENCOUNTER — Encounter: Payer: Self-pay | Admitting: Nurse Practitioner

## 2016-12-27 ENCOUNTER — Ambulatory Visit (INDEPENDENT_AMBULATORY_CARE_PROVIDER_SITE_OTHER): Payer: 59 | Admitting: Nurse Practitioner

## 2016-12-27 VITALS — BP 128/70 | HR 88 | Temp 97.8°F | Ht 64.0 in | Wt 220.0 lb

## 2016-12-27 DIAGNOSIS — E119 Type 2 diabetes mellitus without complications: Secondary | ICD-10-CM | POA: Diagnosis not present

## 2016-12-27 DIAGNOSIS — E782 Mixed hyperlipidemia: Secondary | ICD-10-CM | POA: Diagnosis not present

## 2016-12-27 DIAGNOSIS — M8580 Other specified disorders of bone density and structure, unspecified site: Secondary | ICD-10-CM

## 2016-12-27 DIAGNOSIS — Z1212 Encounter for screening for malignant neoplasm of rectum: Secondary | ICD-10-CM | POA: Diagnosis not present

## 2016-12-27 DIAGNOSIS — J411 Mucopurulent chronic bronchitis: Secondary | ICD-10-CM

## 2016-12-27 DIAGNOSIS — I10 Essential (primary) hypertension: Secondary | ICD-10-CM

## 2016-12-27 DIAGNOSIS — R609 Edema, unspecified: Secondary | ICD-10-CM

## 2016-12-27 DIAGNOSIS — E559 Vitamin D deficiency, unspecified: Secondary | ICD-10-CM

## 2016-12-27 DIAGNOSIS — Z6838 Body mass index (BMI) 38.0-38.9, adult: Secondary | ICD-10-CM | POA: Diagnosis not present

## 2016-12-27 DIAGNOSIS — F411 Generalized anxiety disorder: Secondary | ICD-10-CM | POA: Diagnosis not present

## 2016-12-27 LAB — BAYER DCA HB A1C WAIVED: HB A1C (BAYER DCA - WAIVED): 7.4 % — ABNORMAL HIGH (ref <7.0)

## 2016-12-27 MED ORDER — FUROSEMIDE 20 MG PO TABS: 20.0000 mg | ORAL_TABLET | Freq: Every day | ORAL | 5 refills | Status: DC

## 2016-12-27 MED ORDER — DULAGLUTIDE 1.5 MG/0.5ML ~~LOC~~ SOAJ: 1.5000 mg | SUBCUTANEOUS | 2 refills | Status: DC

## 2016-12-27 MED ORDER — FENOFIBRATE 160 MG PO TABS: 160.0000 mg | ORAL_TABLET | Freq: Every day | ORAL | 5 refills | Status: DC

## 2016-12-27 MED ORDER — ATORVASTATIN CALCIUM 40 MG PO TABS: ORAL_TABLET | ORAL | 5 refills | Status: DC

## 2016-12-27 MED ORDER — GLIMEPIRIDE 4 MG PO TABS: 4.0000 mg | ORAL_TABLET | Freq: Every day | ORAL | 4 refills | Status: DC

## 2016-12-27 MED ORDER — FUROSEMIDE 40 MG PO TABS: 40.0000 mg | ORAL_TABLET | Freq: Every day | ORAL | 5 refills | Status: DC

## 2016-12-27 MED ORDER — FLUTICASONE FUROATE-VILANTEROL 100-25 MCG/INH IN AEPB: INHALATION_SPRAY | RESPIRATORY_TRACT | 2 refills | Status: DC

## 2016-12-27 MED ORDER — METFORMIN HCL 1000 MG PO TABS: 1000.0000 mg | ORAL_TABLET | Freq: Two times a day (BID) | ORAL | 5 refills | Status: DC

## 2016-12-27 NOTE — Patient Instructions (Signed)

## 2016-12-27 NOTE — Progress Notes (Signed)
Subjective:    Patient ID: Annette Gibson, female    DOB: 1956/02/01, 61 y.o.   MRN: [id number]  HPI Annette Gibson is here today for follow up of chronic medical problem.  Outpatient Encounter Prescriptions as of 12/27/2016  Medication Sig  . albuterol (PROVENTIL) (2.5 MG/3ML) 0.083% nebulizer solution Take 3 mLs (2.5 mg total) by nebulization every 6 (six) hours as needed for wheezing or shortness of breath.  Marland Kitchen atorvastatin (LIPITOR) 40 MG tablet Take 1 tablet (40 mg total) by mouth daily at 6 PM.  . Dulaglutide (TRULICITY) 1.5 WG/9.5AO SOPN Inject 1.5 mg into the skin once a week.  . fenofibrate 160 MG tablet Take 1 tablet (160 mg total) by mouth daily.  . fluticasone furoate-vilanterol (BREO ELLIPTA) 100-25 MCG/INH AEPB INHALE 1 PUFF INTO LUNGS DAILY AS DIRECTED  . furosemide (LASIX) 20 MG tablet Take 1 tablet (20 mg total) by mouth daily.  Marland Kitchen glimepiride (AMARYL) 4 MG tablet Take 1 tablet (4 mg total) by mouth daily before breakfast.  . metFORMIN (GLUCOPHAGE) 1000 MG tablet Take 1 tablet (1,000 mg total) by mouth 2 (two) times daily with a meal.  . ONETOUCH VERIO test strip CHECK BLOOD SUGAR ONCE DAILY OR AS DIRECTED   No facility-administered encounter medications on file as of 12/27/2016.     1. Type 2 diabetes mellitus without complication, without long-term current use of insulin ([place])  Diabetes managed with Trulicity, Amaryl, and Metformin.  Patient checks blood glucose at home occasionally and states blood glucose in around 120-140.  2. Essential hypertension  Blood pressure controlled.  Does not check blood pressure at home.  3. Mixed hyperlipidemia  Does not watch diet or exercise.  Patient taking statin and fenofibrate with no c/o muscle aches.  4. Mucopurulent chronic bronchitis (HCC)  BREO daily.  Albuterol used intermittently as needed.  Patient has noted wheezing.  5. Osteopenia, unspecified location  No c/o back pain.  6. Peripheral edema Patient takes Lasix  daily and elevates legs when sitting.   7. GAD (generalized anxiety disorder)  Symptoms managed with stress management techniques.  8. Vitamin D deficiency  Increased time in sun for vitamin D supplementation.  9. BMI 38.0-38.9,adult  No significant weight gain or loss since previous visit.    New complaints: Patient complains of red, dry patches on bilateral knees that flare up in the sun.    Social history: Her boyfriend visit daily even though he has moved out of her house   Review of Systems  Constitutional: Negative for activity change, appetite change and fatigue.  Respiratory: Positive for wheezing. Negative for cough and shortness of breath.   Cardiovascular: Negative for chest pain and palpitations.  Gastrointestinal: Negative for abdominal pain.  Neurological: Negative for dizziness, light-headedness and headaches.  All other systems reviewed and are negative.      Objective:   Physical Exam  Constitutional: She is oriented to person, place, and time. She appears well-developed and well-nourished. No distress.  HENT:  Head: Normocephalic.  Right Ear: External ear normal.  Left Ear: External ear normal.  Mouth/Throat: Oropharynx is clear and moist.  Eyes: Pupils are equal, round, and reactive to light.  Neck: Normal range of motion. Neck supple. No JVD present. No thyromegaly present.  Cardiovascular: Normal rate, regular rhythm, normal heart sounds and intact distal pulses.   No murmur heard. Pulmonary/Chest: Effort normal. No respiratory distress. She has wheezes (all lung lobes).  Abdominal: Soft. Bowel sounds are normal. She exhibits no  distension. There is no tenderness.  Musculoskeletal: Normal range of motion. She exhibits edema (bilateral lower extremities).  Lymphadenopathy:    She has no cervical adenopathy.  Neurological: She is alert and oriented to person, place, and time.  Skin: Skin is warm and dry.  Psychiatric: She has a normal mood and affect.  Her behavior is normal. Judgment and thought content normal.   BP 128/70   Pulse 88   Temp 97.8 F (36.6 C) (Oral)   Ht 5' 4" (1.626 m)   Wt 220 lb (99.8 kg)   BMI 37.76 kg/m  A1C: 7.4%    Assessment & Plan:  1. Type 2 diabetes mellitus without complication, without long-term current use of insulin (HCC) Stricter carb counting - Bayer DCA Hb A1c Waived - Microalbumin / creatinine urine ratio - glimepiride (AMARYL) 4 MG tablet; Take 1 tablet (4 mg total) by mouth daily before breakfast.  Dispense: 30 tablet; Refill: 4 - Dulaglutide (TRULICITY) 1.5 BN/3.4KE SOPN; Inject 1.5 mg into the skin once a week.  Dispense: 4 pen; Refill: 2 - metFORMIN (GLUCOPHAGE) 1000 MG tablet; Take 1 tablet (1,000 mg total) by mouth 2 (two) times daily with a meal.  Dispense: 60 tablet; Refill: 5  2. Essential hypertension Low sodium diet - CMP14+EGFR  3. Mixed hyperlipidemia Low fat diet - Lipid panel - fenofibrate 160 MG tablet; Take 1 tablet (160 mg total) by mouth daily.  Dispense: 30 tablet; Refill: 5 - atorvastatin (LIPITOR) 40 MG tablet; Take 1 tablet (40 mg total) by mouth daily at 6 PM.  Dispense: 30 tablet; Refill: 5  4. Mucopurulent chronic bronchitis (HCC) Stop smoking - fluticasone furoate-vilanterol (BREO ELLIPTA) 100-25 MCG/INH AEPB; INHALE 1 PUFF INTO LUNGS DAILY AS DIRECTED  Dispense: 60 each; Refill: 2  5. Osteopenia, unspecified location Weight bearing exercises  6. Peripheral edema Elevate legs when sitting Increased lasix from 85m to 465m- furosemide (LASIX) 40 MG tablet; Take 1 tablet (40 mg total) by mouth daily.  Dispense: 30 tablet; Refill: 5  7. GAD (generalized anxiety disorder) Stress management  8. Vitamin D deficiency  9. BMI 38.0-38.9,adult Discussed diet and exercise for person with BMI >25 Will recheck weight in 3-6 months  10. Encounter for screening for malignant neoplasm of rectum - Fecal occult blood, imunochemical; Future    Labs pending Health  maintenance reviewed Diet and exercise encouraged Continue all meds Follow up  In 3 months   [place]

## 2016-12-28 LAB — CMP14+EGFR
ALK PHOS: 64 IU/L (ref 39–117)
ALT: 24 IU/L (ref 0–32)
AST: 21 IU/L (ref 0–40)
Albumin/Globulin Ratio: 1.6 (ref 1.2–2.2)
Albumin: 4 g/dL (ref 3.6–4.8)
BUN/Creatinine Ratio: 11 — ABNORMAL LOW (ref 12–28)
BUN: 9 mg/dL (ref 8–27)
Bilirubin Total: 0.4 mg/dL (ref 0.0–1.2)
CO2: 31 mmol/L — ABNORMAL HIGH (ref 20–29)
Calcium: 9.9 mg/dL (ref 8.7–10.3)
Chloride: 101 mmol/L (ref 96–106)
Creatinine, Ser: 0.8 mg/dL (ref 0.57–1.00)
GFR calc Af Amer: 93 mL/min/{1.73_m2} (ref >59)
GFR calc non Af Amer: 80 mL/min/{1.73_m2} (ref >59)
GLOBULIN, TOTAL: 2.5 g/dL (ref 1.5–4.5)
Glucose: 144 mg/dL — ABNORMAL HIGH (ref 65–99)
POTASSIUM: 4.9 mmol/L (ref 3.5–5.2)
SODIUM: 147 mmol/L — AB (ref 134–144)
Total Protein: 6.5 g/dL (ref 6.0–8.5)

## 2016-12-28 LAB — LIPID PANEL
CHOL/HDL RATIO: 3 ratio (ref 0.0–4.4)
Cholesterol, Total: 124 mg/dL (ref 100–199)
HDL: 42 mg/dL (ref >39)
LDL Calculated: 42 mg/dL (ref 0–99)
TRIGLYCERIDES: 200 mg/dL — AB (ref 0–149)
VLDL Cholesterol Cal: 40 mg/dL (ref 5–40)

## 2016-12-28 LAB — MICROALBUMIN / CREATININE URINE RATIO: Creatinine, Urine: 34.3 mg/dL

## 2017-01-03 ENCOUNTER — Other Ambulatory Visit: Payer: 59

## 2017-01-03 DIAGNOSIS — Z1212 Encounter for screening for malignant neoplasm of rectum: Secondary | ICD-10-CM

## 2017-01-06 LAB — FECAL OCCULT BLOOD, IMMUNOCHEMICAL: Fecal Occult Bld: NEGATIVE

## 2017-03-03 ENCOUNTER — Other Ambulatory Visit: Payer: Self-pay | Admitting: *Deleted

## 2017-03-03 DIAGNOSIS — E119 Type 2 diabetes mellitus without complications: Secondary | ICD-10-CM

## 2017-03-03 MED ORDER — DULAGLUTIDE 1.5 MG/0.5ML ~~LOC~~ SOAJ: 1.5000 mg | SUBCUTANEOUS | 0 refills | Status: DC

## 2017-04-04 ENCOUNTER — Encounter: Payer: Self-pay | Admitting: Nurse Practitioner

## 2017-04-04 ENCOUNTER — Ambulatory Visit (INDEPENDENT_AMBULATORY_CARE_PROVIDER_SITE_OTHER): Payer: Self-pay | Admitting: Nurse Practitioner

## 2017-04-04 VITALS — BP 123/72 | HR 91 | Temp 97.9°F | Ht 64.0 in | Wt 212.0 lb

## 2017-04-04 DIAGNOSIS — M8580 Other specified disorders of bone density and structure, unspecified site: Secondary | ICD-10-CM

## 2017-04-04 DIAGNOSIS — I10 Essential (primary) hypertension: Secondary | ICD-10-CM

## 2017-04-04 DIAGNOSIS — E782 Mixed hyperlipidemia: Secondary | ICD-10-CM

## 2017-04-04 DIAGNOSIS — E119 Type 2 diabetes mellitus without complications: Secondary | ICD-10-CM

## 2017-04-04 DIAGNOSIS — F411 Generalized anxiety disorder: Secondary | ICD-10-CM

## 2017-04-04 DIAGNOSIS — J411 Mucopurulent chronic bronchitis: Secondary | ICD-10-CM

## 2017-04-04 DIAGNOSIS — Z6838 Body mass index (BMI) 38.0-38.9, adult: Secondary | ICD-10-CM

## 2017-04-04 DIAGNOSIS — R609 Edema, unspecified: Secondary | ICD-10-CM

## 2017-04-04 DIAGNOSIS — E559 Vitamin D deficiency, unspecified: Secondary | ICD-10-CM

## 2017-04-04 LAB — BAYER DCA HB A1C WAIVED: HB A1C (BAYER DCA - WAIVED): 7.3 % — ABNORMAL HIGH (ref <7.0)

## 2017-04-04 MED ORDER — FENOFIBRATE 134 MG PO CAPS: 134.0000 mg | ORAL_CAPSULE | Freq: Every day | ORAL | 11 refills | Status: DC

## 2017-04-04 NOTE — Progress Notes (Signed)
Subjective:    Patient ID: Mardene Celeste L. Folmer, female    DOB: [birth year], 61 y.o.   MRN: [id number]  HPI  [place] is here today for follow up of chronic medical problem.  Outpatient Encounter Prescriptions as of 04/04/2017  Medication Sig  . albuterol (PROVENTIL) (2.5 MG/3ML) 0.083% nebulizer solution Take 3 mLs (2.5 mg total) by nebulization every 6 (six) hours as needed for wheezing or shortness of breath.  Marland Kitchen atorvastatin (LIPITOR) 40 MG tablet Take 1 tablet (40 mg total) by mouth daily at 6 PM.  . Dulaglutide (TRULICITY) 1.5 LG/9.2JJ SOPN Inject 1.5 mg into the skin once a week.  . fenofibrate 160 MG tablet Take 1 tablet (160 mg total) by mouth daily.  . fluticasone furoate-vilanterol (BREO ELLIPTA) 100-25 MCG/INH AEPB INHALE 1 PUFF INTO LUNGS DAILY AS DIRECTED  . furosemide (LASIX) 40 MG tablet Take 1 tablet (40 mg total) by mouth daily.  Marland Kitchen glimepiride (AMARYL) 4 MG tablet Take 1 tablet (4 mg total) by mouth daily before breakfast.  . metFORMIN (GLUCOPHAGE) 1000 MG tablet Take 1 tablet (1,000 mg total) by mouth 2 (two) times daily with a meal.  . ONETOUCH VERIO test strip CHECK BLOOD SUGAR ONCE DAILY OR AS DIRECTED   No facility-administered encounter medications on file as of 04/04/2017.     1. Type 2 diabetes mellitus without complication, without long-term current use of insulin ([place]) last hgba1c was 7.4%. No med changes were made at last visit. She has not been checking her blood sugars. Can no longer afford trulicity  2. Essential hypertension  No c/o chest pain, sob or headache  3. Mixed hyperlipidemia  Has not been watching diet  4. Mucopurulent chronic bronchitis (HCC)  Still smoking- has chronic cough  5. Osteopenia, unspecified location  Does very little weight bearing exercises. No c/o back pain  6. Vitamin D deficiency  Has not been taking any vitamin D  7. Peripheral edema  has edema daily if on feet a lot- resolves with sitting and propping feet up  8.  GAD (generalized anxiety disorder)  Is very stressed but is currently not taking anything  9. 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  Negative for activity change and appetite change.  HENT: Negative.   Eyes: Negative for pain.  Respiratory: Negative for shortness of breath.   Cardiovascular: Negative for chest pain, palpitations and leg swelling.  Gastrointestinal: Negative for abdominal pain.  Endocrine: Negative for polydipsia.  Genitourinary: Negative.   Skin: Negative for rash.  Neurological: Negative for dizziness, weakness and headaches.  Hematological: Does not bruise/bleed easily.  Psychiatric/Behavioral: Negative.   All other systems reviewed and are negative.      Objective:   Physical Exam  Constitutional: She is oriented to person, place, and time. She appears well-developed and well-nourished.  HENT:  Nose: Nose normal.  Mouth/Throat: Oropharynx is clear and moist.  Eyes: EOM are normal.  Neck: Trachea normal, normal range of motion and full passive range of motion without pain. Neck supple. No JVD present. Carotid bruit is not present. No thyromegaly present.  Cardiovascular: Normal rate, regular rhythm, normal heart sounds and intact distal pulses.  Exam reveals no gallop and no friction rub.   No murmur heard. Pulmonary/Chest: Effort normal and breath sounds  normal.  Abdominal: Soft. Bowel sounds are normal. She exhibits no distension and no mass. There is no tenderness.  Musculoskeletal: Normal range of motion.  Lymphadenopathy:    She has no cervical adenopathy.  Neurological: She is alert and oriented to person, place, and time. She has normal reflexes.  Skin: Skin is warm and dry.  Psychiatric: She has a normal mood and affect. Her  behavior is normal. Judgment and thought content normal.    BP 123/72   Pulse 91   Temp 97.9 F (36.6 C) (Oral)   Ht _0  (1.626 m)   Wt 212 lb (96.2 kg)   BMI 36.39 kg/m   HGBa1c- 7.3% today     Assessment & Plan:  1. Type 2 diabetes mellitus without complication, without long-term current use of insulin (HCC) Stricter carb counting - Bayer DCA Hb A1c Waived  2. Essential hypertension Low sodium diet  3. Mixed hyperlipidemia Low fat diet  4. Mucopurulent chronic bronchitis (HCC) Smoking cessation encouraged  5. Osteopenia, unspecified location Weight bearing exercises encouraged  6. Vitamin D deficiency Vitamin d OTC  7. Peripheral edema Elevate legs when sitting  8. GAD (generalized anxiety disorder) Stress management  9. BMI 38.0-38.9,adult Discussed diet and exercise for person with BMI >25 Will recheck weight in 3-6 months    Labs pending Health maintenance reviewed Diet and exercise encouraged Continue all meds Follow up  In 3 months   [place], FNP

## 2017-04-04 NOTE — Patient Instructions (Signed)

## 2017-04-04 NOTE — Addendum Note (Signed)
Addended by: Chevis Pretty on: 04/04/2017 10:36 AM   Modules accepted: Orders

## 2017-05-08 ENCOUNTER — Other Ambulatory Visit: Payer: Self-pay | Admitting: *Deleted

## 2017-05-08 DIAGNOSIS — R609 Edema, unspecified: Secondary | ICD-10-CM

## 2017-05-08 MED ORDER — FUROSEMIDE 40 MG PO TABS: 40.0000 mg | ORAL_TABLET | Freq: Every day | ORAL | 5 refills | Status: DC

## 2017-05-14 ENCOUNTER — Telehealth: Payer: Self-pay | Admitting: Nurse Practitioner

## 2017-05-23 ENCOUNTER — Encounter: Payer: Self-pay | Admitting: Family

## 2017-05-23 ENCOUNTER — Inpatient Hospital Stay (HOSPITAL_COMMUNITY)
Admission: EM | Admit: 2017-05-23 | Discharge: 2017-05-28 | DRG: 291 | Disposition: A | Discharge status: Home / Self Care | Payer: Self-pay | Attending: Family Medicine | Admitting: Family Medicine

## 2017-05-23 ENCOUNTER — Ambulatory Visit (INDEPENDENT_AMBULATORY_CARE_PROVIDER_SITE_OTHER): Payer: Self-pay | Admitting: Family

## 2017-05-23 ENCOUNTER — Other Ambulatory Visit: Payer: Self-pay | Admitting: Nurse Practitioner

## 2017-05-23 ENCOUNTER — Other Ambulatory Visit: Payer: Self-pay

## 2017-05-23 ENCOUNTER — Encounter (HOSPITAL_COMMUNITY): Payer: Self-pay

## 2017-05-23 ENCOUNTER — Emergency Department (HOSPITAL_COMMUNITY): Payer: Self-pay

## 2017-05-23 VITALS — BP 138/78 | HR 74 | Temp 98.8°F | Resp 24 | Ht 64.0 in | Wt 212.8 lb

## 2017-05-23 DIAGNOSIS — I5033 Acute on chronic diastolic (congestive) heart failure: Secondary | ICD-10-CM | POA: Diagnosis present

## 2017-05-23 DIAGNOSIS — R197 Diarrhea, unspecified: Secondary | ICD-10-CM | POA: Diagnosis present

## 2017-05-23 DIAGNOSIS — F419 Anxiety disorder, unspecified: Secondary | ICD-10-CM | POA: Diagnosis present

## 2017-05-23 DIAGNOSIS — J449 Chronic obstructive pulmonary disease, unspecified: Secondary | ICD-10-CM | POA: Diagnosis present

## 2017-05-23 DIAGNOSIS — J441 Chronic obstructive pulmonary disease with (acute) exacerbation: Secondary | ICD-10-CM | POA: Diagnosis present

## 2017-05-23 DIAGNOSIS — Z90710 Acquired absence of both cervix and uterus: Secondary | ICD-10-CM

## 2017-05-23 DIAGNOSIS — E669 Obesity, unspecified: Secondary | ICD-10-CM | POA: Diagnosis present

## 2017-05-23 DIAGNOSIS — Z79899 Other long term (current) drug therapy: Secondary | ICD-10-CM

## 2017-05-23 DIAGNOSIS — E1165 Type 2 diabetes mellitus with hyperglycemia: Secondary | ICD-10-CM | POA: Diagnosis present

## 2017-05-23 DIAGNOSIS — R609 Edema, unspecified: Secondary | ICD-10-CM

## 2017-05-23 DIAGNOSIS — E119 Type 2 diabetes mellitus without complications: Secondary | ICD-10-CM

## 2017-05-23 DIAGNOSIS — J189 Pneumonia, unspecified organism: Secondary | ICD-10-CM

## 2017-05-23 DIAGNOSIS — I159 Secondary hypertension, unspecified: Secondary | ICD-10-CM

## 2017-05-23 DIAGNOSIS — E876 Hypokalemia: Secondary | ICD-10-CM | POA: Diagnosis present

## 2017-05-23 DIAGNOSIS — Z87891 Personal history of nicotine dependence: Secondary | ICD-10-CM

## 2017-05-23 DIAGNOSIS — Z7984 Long term (current) use of oral hypoglycemic drugs: Secondary | ICD-10-CM

## 2017-05-23 DIAGNOSIS — I509 Heart failure, unspecified: Secondary | ICD-10-CM

## 2017-05-23 DIAGNOSIS — I10 Essential (primary) hypertension: Secondary | ICD-10-CM | POA: Diagnosis present

## 2017-05-23 DIAGNOSIS — Z833 Family history of diabetes mellitus: Secondary | ICD-10-CM

## 2017-05-23 DIAGNOSIS — J9622 Acute and chronic respiratory failure with hypercapnia: Secondary | ICD-10-CM | POA: Diagnosis present

## 2017-05-23 DIAGNOSIS — I110 Hypertensive heart disease with heart failure: Principal | ICD-10-CM | POA: Diagnosis present

## 2017-05-23 DIAGNOSIS — G4733 Obstructive sleep apnea (adult) (pediatric): Secondary | ICD-10-CM | POA: Diagnosis present

## 2017-05-23 DIAGNOSIS — J9621 Acute and chronic respiratory failure with hypoxia: Secondary | ICD-10-CM | POA: Diagnosis present

## 2017-05-23 DIAGNOSIS — R51 Headache: Secondary | ICD-10-CM

## 2017-05-23 DIAGNOSIS — Z09 Encounter for follow-up examination after completed treatment for conditions other than malignant neoplasm: Secondary | ICD-10-CM

## 2017-05-23 DIAGNOSIS — J411 Mucopurulent chronic bronchitis: Secondary | ICD-10-CM

## 2017-05-23 DIAGNOSIS — Z9981 Dependence on supplemental oxygen: Secondary | ICD-10-CM

## 2017-05-23 DIAGNOSIS — R195 Other fecal abnormalities: Secondary | ICD-10-CM

## 2017-05-23 DIAGNOSIS — F411 Generalized anxiety disorder: Secondary | ICD-10-CM | POA: Diagnosis present

## 2017-05-23 DIAGNOSIS — Z888 Allergy status to other drugs, medicaments and biological substances status: Secondary | ICD-10-CM

## 2017-05-23 DIAGNOSIS — R519 Headache, unspecified: Secondary | ICD-10-CM

## 2017-05-23 DIAGNOSIS — R5381 Other malaise: Secondary | ICD-10-CM | POA: Diagnosis present

## 2017-05-23 LAB — BASIC METABOLIC PANEL
Anion gap: 7 (ref 5–15)
BUN: 10 mg/dL (ref 6–20)
CHLORIDE: 93 mmol/L — AB (ref 101–111)
CO2: 41 mmol/L — ABNORMAL HIGH (ref 22–32)
CREATININE: 0.89 mg/dL (ref 0.44–1.00)
Calcium: 8.9 mg/dL (ref 8.9–10.3)
GFR calc non Af Amer: >60 mL/min (ref >60)
Glucose, Bld: 205 mg/dL — ABNORMAL HIGH (ref 65–99)
POTASSIUM: 3.1 mmol/L — AB (ref 3.5–5.1)
SODIUM: 141 mmol/L (ref 135–145)

## 2017-05-23 LAB — CBC
HEMATOCRIT: 45 % (ref 36.0–46.0)
HEMOGLOBIN: 14.2 g/dL (ref 12.0–15.0)
MCH: 30.6 pg (ref 26.0–34.0)
MCHC: 31.6 g/dL (ref 30.0–36.0)
MCV: 97 fL (ref 78.0–100.0)
Platelets: 228 10*3/uL (ref 150–400)
RBC: 4.64 MIL/uL (ref 3.87–5.11)
RDW: 13.6 % (ref 11.5–15.5)
WBC: 11.6 10*3/uL — AB (ref 4.0–10.5)

## 2017-05-23 LAB — GLUCOSE, CAPILLARY: GLUCOSE-CAPILLARY: 257 mg/dL — AB (ref 65–99)

## 2017-05-23 LAB — HEPATIC FUNCTION PANEL
ALBUMIN: 3.2 g/dL — AB (ref 3.5–5.0)
ALK PHOS: 66 U/L (ref 38–126)
ALT: 123 U/L — ABNORMAL HIGH (ref 14–54)
AST: 29 U/L (ref 15–41)
Bilirubin, Direct: 0.3 mg/dL (ref 0.1–0.5)
Indirect Bilirubin: 1.1 mg/dL — ABNORMAL HIGH (ref 0.3–0.9)
TOTAL PROTEIN: 6 g/dL — AB (ref 6.5–8.1)
Total Bilirubin: 1.4 mg/dL — ABNORMAL HIGH (ref 0.3–1.2)

## 2017-05-23 LAB — BRAIN NATRIURETIC PEPTIDE: B Natriuretic Peptide: 141.7 pg/mL — ABNORMAL HIGH (ref 0.0–100.0)

## 2017-05-23 LAB — TROPONIN I: Troponin I: 0.05 ng/mL (ref <0.03)

## 2017-05-23 MED ORDER — ALBUTEROL SULFATE (2.5 MG/3ML) 0.083% IN NEBU
5.0000 mg | INHALATION_SOLUTION | Freq: Once | RESPIRATORY_TRACT | Status: AC
  Administered 2017-05-23: 5 mg via RESPIRATORY_TRACT
  Filled 2017-05-23: qty 6

## 2017-05-23 MED ORDER — SODIUM CHLORIDE 0.9 % IV SOLN: 250.0000 mL | INTRAVENOUS | Status: DC | PRN

## 2017-05-23 MED ORDER — ALBUTEROL (5 MG/ML) CONTINUOUS INHALATION SOLN
10.0000 mg/h | INHALATION_SOLUTION | Freq: Once | RESPIRATORY_TRACT | Status: AC
  Administered 2017-05-23: 10 mg/h via RESPIRATORY_TRACT
  Filled 2017-05-23: qty 20

## 2017-05-23 MED ORDER — MAGNESIUM SULFATE IN D5W 1-5 GM/100ML-% IV SOLN
1.0000 g | Freq: Once | INTRAVENOUS | Status: AC
Start: 2017-05-23 — End: 2017-05-23
  Administered 2017-05-23: 1 g via INTRAVENOUS
  Filled 2017-05-23: qty 100

## 2017-05-23 MED ORDER — FUROSEMIDE 10 MG/ML IJ SOLN
40.0000 mg | Freq: Two times a day (BID) | INTRAMUSCULAR | Status: DC
  Administered 2017-05-24 – 2017-05-28 (×9): 40 mg via INTRAVENOUS
  Filled 2017-05-23 (×9): qty 4

## 2017-05-23 MED ORDER — METHYLPREDNISOLONE SODIUM SUCC 40 MG IJ SOLR
40.0000 mg | Freq: Three times a day (TID) | INTRAMUSCULAR | Status: DC
  Administered 2017-05-24: 40 mg via INTRAVENOUS
  Filled 2017-05-23: qty 1

## 2017-05-23 MED ORDER — FUROSEMIDE 10 MG/ML IJ SOLN
40.0000 mg | Freq: Once | INTRAMUSCULAR | Status: AC
  Administered 2017-05-23: 40 mg via INTRAVENOUS
  Filled 2017-05-23: qty 4

## 2017-05-23 MED ORDER — POTASSIUM CHLORIDE CRYS ER 20 MEQ PO TBCR
40.0000 meq | EXTENDED_RELEASE_TABLET | Freq: Once | ORAL | Status: AC
  Administered 2017-05-23: 40 meq via ORAL
  Filled 2017-05-23: qty 2

## 2017-05-23 MED ORDER — ALPRAZOLAM 0.25 MG PO TABS: 0.2500 mg | ORAL_TABLET | Freq: Two times a day (BID) | ORAL | Status: DC | PRN

## 2017-05-23 MED ORDER — ATORVASTATIN CALCIUM 40 MG PO TABS
40.0000 mg | ORAL_TABLET | Freq: Every day | ORAL | Status: DC
  Administered 2017-05-23 – 2017-05-27 (×5): 40 mg via ORAL
  Filled 2017-05-23 (×5): qty 1

## 2017-05-23 MED ORDER — FLUTICASONE FUROATE-VILANTEROL 100-25 MCG/INH IN AEPB
1.0000 | INHALATION_SPRAY | Freq: Every day | RESPIRATORY_TRACT | Status: DC
  Administered 2017-05-24: 1 via RESPIRATORY_TRACT
  Filled 2017-05-23: qty 28

## 2017-05-23 MED ORDER — IPRATROPIUM BROMIDE 0.02 % IN SOLN
0.5000 mg | Freq: Once | RESPIRATORY_TRACT | Status: AC
  Administered 2017-05-23: 0.5 mg via RESPIRATORY_TRACT
  Filled 2017-05-23: qty 2.5

## 2017-05-23 MED ORDER — IPRATROPIUM-ALBUTEROL 0.5-2.5 (3) MG/3ML IN SOLN
3.0000 mL | Freq: Three times a day (TID) | RESPIRATORY_TRACT | Status: DC
  Administered 2017-05-24 (×2): 3 mL via RESPIRATORY_TRACT
  Filled 2017-05-23 (×2): qty 3

## 2017-05-23 MED ORDER — SODIUM CHLORIDE 0.9% FLUSH
3.0000 mL | Freq: Two times a day (BID) | INTRAVENOUS | Status: DC
  Administered 2017-05-23: 3 mL via INTRAVENOUS

## 2017-05-23 MED ORDER — ALBUTEROL SULFATE (2.5 MG/3ML) 0.083% IN NEBU: 2.5000 mg | INHALATION_SOLUTION | RESPIRATORY_TRACT | Status: DC | PRN

## 2017-05-23 MED ORDER — INSULIN ASPART 100 UNIT/ML ~~LOC~~ SOLN
0.0000 [IU] | SUBCUTANEOUS | Status: DC
  Administered 2017-05-23 – 2017-05-24 (×2): 8 [IU] via SUBCUTANEOUS
  Administered 2017-05-24: 15 [IU] via SUBCUTANEOUS
  Administered 2017-05-24: 8 [IU] via SUBCUTANEOUS
  Administered 2017-05-24: 3 [IU] via SUBCUTANEOUS

## 2017-05-23 MED ORDER — METHYLPREDNISOLONE SODIUM SUCC 125 MG IJ SOLR
125.0000 mg | Freq: Once | INTRAMUSCULAR | Status: AC
  Administered 2017-05-23: 125 mg via INTRAVENOUS
  Filled 2017-05-23: qty 2

## 2017-05-23 MED ORDER — ACETAMINOPHEN 325 MG PO TABS: 650.0000 mg | ORAL_TABLET | ORAL | Status: DC | PRN

## 2017-05-23 MED ORDER — HYDRALAZINE HCL 20 MG/ML IJ SOLN
10.0000 mg | INTRAMUSCULAR | Status: DC | PRN
  Filled 2017-05-23: qty 1

## 2017-05-23 MED ORDER — SODIUM CHLORIDE 0.9% FLUSH: 3.0000 mL | INTRAVENOUS | Status: DC | PRN

## 2017-05-23 MED ORDER — ONDANSETRON HCL 4 MG/2ML IJ SOLN: 4.0000 mg | Freq: Four times a day (QID) | INTRAMUSCULAR | Status: DC | PRN

## 2017-05-23 MED ORDER — CARVEDILOL 3.125 MG PO TABS
3.1250 mg | ORAL_TABLET | Freq: Two times a day (BID) | ORAL | Status: DC
  Administered 2017-05-24 – 2017-05-28 (×7): 3.125 mg via ORAL
  Filled 2017-05-23 (×10): qty 1

## 2017-05-23 NOTE — Progress Notes (Signed)
CRITICAL VALUE ALERT  Critical Value:  Trop 0.05  Date & Time Notied:  05/23/17 23:01  Provider Notified: Dr. Silas [place]  Orders Received/Actions taken: Mag infusing now and Mag lab is ordered for the am. Dr made aware

## 2017-05-23 NOTE — ED Provider Notes (Addendum)
[facility] EMERGENCY DEPARTMENT Provider Note   CSN: [id number] Arrival date & time: 05/23/17  1521     History   Chief Complaint Chief Complaint  Patient presents with  . Follow-up    HPI Annette Gibson is a 61 y.o. female.  HPI   Patient is a 61 year old female with a history of CHF, COPD, and diabetes who presents to the ED today complaining of shortness of breath and generalized weakness that has been ongoing since she was discharged from the hospital on 12/18. According to PCP note, patient was admitted to the ICU on 12/11 for sepsis, influenza A, pneumonia, COPD exacerbation, heme positive stools, and elevated LFTs.  She received IV antibiotics during admission, and was discharged without any further antibiotics. Since she has been home patient has had persistent shortness of breath, wheezing, and a nonproductive cough.  She states she has not taken her Breo inhaler, but has been doing neb treatments once per day without relief. She has not taken any of her other medications. Patient further reports headache, multiple episodes of yellowish diarrhea that are too many to count, bilateral leg swelling, and decreased p.o. Intake.  She denies any fevers, chest pain, abdominal pain.    She was sent on home oxygen at 2 L which she has been using.  She was also supposed to receive BiPAP at home, but has not received yet.    Pt noted to have O2 sats in 60s on arrival.   Past Medical History:  Diagnosis Date  . Anal warts    anal warts  . Anxiety   . Congestive heart failure (CHF) ([place])   . COPD (chronic obstructive pulmonary disease) ([place])   . Diabetes mellitus without complication ([place])   . Hypertriglyceridemia   . Obesity    metabolic syndrome   . Osteopenia   . Vitamin D deficiency     Patient Active Problem List   Diagnosis Date Noted  . Acute on chronic respiratory failure with hypoxia and hypercapnia ([place]) 05/23/2017  . CHF, acute on chronic  ([place]) 05/23/2017  . Diarrhea 05/23/2017  . Dark stools 05/23/2017  . Hypokalemia 05/23/2017  . BMI 38.0-38.9,adult 12/08/2015  . Hypertension 10/09/2012  . Hyperlipidemia 10/09/2012  . Peripheral edema 10/09/2012  . COPD with acute exacerbation ([place]) 10/09/2012  . GAD (generalized anxiety disorder) 10/09/2012  . Vitamin D deficiency 10/09/2012  . Diabetes ([place]) 10/09/2012  . Osteopenia 10/09/2012    Past Surgical History:  Procedure Laterality Date  . ABDOMINAL HYSTERECTOMY    . FRACTURE SURGERY     Right leg and ankle  . lt ganglion cyst removal  09/2007    OB History    No data available       Home Medications    Prior to Admission medications   Medication Sig Start Date End Date Taking? Authorizing Provider  albuterol (PROVENTIL) (2.5 MG/3ML) 0.083% nebulizer solution Take 3 mLs (2.5 mg total) by nebulization every 6 (six) hours as needed for wheezing or shortness of breath. 04/23/16   Hassell Done, Mary-Margaret, FNP  atorvastatin (LIPITOR) 40 MG tablet Take 1 tablet (40 mg total) by mouth daily at 6 PM. 12/27/16   Hassell Done, Mary-Margaret, FNP  Dulaglutide (TRULICITY) 1.5 WU/9.8JX SOPN Inject 1.5 mg into the skin once a week. 03/03/17   Chevis Pretty, FNP  fenofibrate micronized (LOFIBRA) 134 MG capsule Take 1 capsule (134 mg total) by mouth daily before breakfast. 04/04/17 03/30/18  Hassell Done, Mary-Margaret, FNP  fluticasone furoate-vilanterol (BREO ELLIPTA)  100-25 MCG/INH AEPB INHALE 1 PUFF INTO LUNGS DAILY AS DIRECTED 12/27/16   Hassell Done, Mary-Margaret, FNP  furosemide (LASIX) 40 MG tablet Take 1 tablet (40 mg total) by mouth daily. 05/08/17   Hassell Done, Mary-Margaret, FNP  glimepiride (AMARYL) 4 MG tablet Take 1 tablet (4 mg total) by mouth daily before breakfast. 12/27/16   Chevis Pretty, FNP  metFORMIN (GLUCOPHAGE) 1000 MG tablet Take 1 tablet (1,000 mg total) by mouth 2 (two) times daily with a meal. 12/27/16   Chevis Pretty, FNP  [facility] VERIO test strip CHECK  BLOOD SUGAR ONCE DAILY OR AS DIRECTED 11/24/15   Chevis Pretty, FNP    Family History Family History  Problem Relation Age of Onset  . Pneumonia Mother   . Diabetes Mother     Social History Social History   Tobacco Use  . Smoking status: Current Every Day Smoker  . Smokeless tobacco: Never Used  Substance Use Topics  . Alcohol use: No  . Drug use: No     Allergies   Celebrex [celecoxib] and Voltaren [diclofenac sodium]   Review of Systems Review of Systems  Constitutional: Positive for fatigue. Negative for chills and fever.  HENT: Negative for ear pain and sore throat.   Eyes: Negative for pain and visual disturbance.  Respiratory: Positive for cough, chest tightness, shortness of breath and wheezing.   Cardiovascular: Positive for leg swelling. Negative for chest pain and palpitations.  Gastrointestinal: Negative for abdominal pain and vomiting.  Genitourinary: Negative for dysuria and hematuria.  Musculoskeletal: Negative for arthralgias and back pain.  Skin: Negative for color change and rash.  Neurological: Positive for weakness (generalized) and headaches. Negative for seizures and syncope.  All other systems reviewed and are negative.    Physical Exam Updated Vital Signs BP (!) 176/59   Pulse 70   Temp 98.5 F (36.9 C) (Rectal)   Resp (!) 21   SpO2 91%   Physical Exam  Constitutional: She appears well-developed and well-nourished. No distress.  HENT:  Head: Normocephalic and atraumatic.  Eyes: Conjunctivae are normal.  Neck: Neck supple.  Cardiovascular: Normal rate, regular rhythm and intact distal pulses.  No murmur heard. Pulmonary/Chest: Effort normal. She has wheezes.  Decreased air movement, wheezes, and rhonchi throughout the bilateral lung fields.  Patient satting at 92% on 4 L.  No tachypnea.  No tripoding.  Abdominal: Soft. There is no tenderness.  Musculoskeletal:  2+ pitting edema bilaterally  Neurological: She is alert.    Skin: Skin is warm and dry.  Psychiatric: She has a normal mood and affect.  Nursing note and vitals reviewed.    ED Treatments / Results  Labs (all labs ordered are listed, but only abnormal results are displayed) Labs Reviewed  CBC - Abnormal; Notable for the following components:      Result Value   WBC 11.6 (*)    All other components within normal limits  BRAIN NATRIURETIC PEPTIDE - Abnormal; Notable for the following components:   B Natriuretic Peptide 141.7 (*)    All other components within normal limits  BASIC METABOLIC PANEL - Abnormal; Notable for the following components:   Potassium 3.1 (*)    Chloride 93 (*)    CO2 41 (*)    Glucose, Bld 205 (*)    All other components within normal limits  C DIFFICILE QUICK SCREEN W PCR REFLEX    EKG  EKG Interpretation  Date/Time:  Friday May 23 2017 16:54:13 EST Ventricular Rate:  52 PR Interval:  QRS Duration: 97 QT Interval:  447 QTC Calculation: 416 R Axis:   92 Text Interpretation:  Sinus rhythm Right axis deviation Baseline wander in lead(s) V1 V2 No old tracing to compare Confirmed by Daleen Bo [phone]) on 05/23/2017 5:34:43 PM       Radiology Dg Chest 2 View  Result Date: 05/23/2017 CLINICAL DATA:  Shortness of breath EXAM: CHEST  2 VIEW COMPARISON:  05/14/2017 FINDINGS: Small bilateral pleural effusions. Borderline to mild cardiomegaly with vascular congestion and diffuse interstitial opacity consistent with pulmonary edema. No focal consolidation. No pneumothorax. IMPRESSION: Cardiomegaly with vascular congestion and diffuse interstitial pulmonary edema and small bilateral pleural effusion. Electronically Signed   By: Donavan Foil M.D.   On: 05/23/2017 17:59    Procedures Procedures (including critical care time) CRITICAL CARE Performed by: Rodney Booze   Total critical care time: 37 minutes  Critical care time was exclusive of separately billable procedures and treating other  patients.  Critical care was necessary to treat or prevent imminent or life-threatening deterioration.  Critical care was time spent personally by me on the following activities: development of treatment plan with patient and/or surrogate as well as nursing, discussions with consultants, evaluation of patient's response to treatment, examination of patient, obtaining history from patient or surrogate, ordering and performing treatments and interventions, ordering and review of laboratory studies, ordering and review of radiographic studies, pulse oximetry and re-evaluation of patient's condition.   Medications Ordered in ED Medications  albuterol (PROVENTIL) (2.5 MG/3ML) 0.083% nebulizer solution 5 mg (5 mg Nebulization Given 05/23/17 1648)  ipratropium (ATROVENT) nebulizer solution 0.5 mg (0.5 mg Nebulization Given 05/23/17 1648)  albuterol (PROVENTIL) (2.5 MG/3ML) 0.083% nebulizer solution 5 mg (5 mg Nebulization Given 05/23/17 1809)  albuterol (PROVENTIL) (2.5 MG/3ML) 0.083% nebulizer solution 5 mg (5 mg Nebulization Given 05/23/17 1823)  albuterol (PROVENTIL,VENTOLIN) solution continuous neb (10 mg/hr Nebulization Given 05/23/17 1928)  potassium chloride SA (K-DUR,KLOR-CON) CR tablet 40 mEq (40 mEq Oral Given 05/23/17 1935)  furosemide (LASIX) injection 40 mg (40 mg Intravenous Given 05/23/17 1941)  methylPREDNISolone sodium succinate (SOLU-MEDROL) 125 mg/2 mL injection 125 mg (125 mg Intravenous Given 05/23/17 1937)     Initial Impression / Assessment and Plan / ED Course  I have reviewed the triage vital signs and the nursing notes.  Pertinent labs & imaging results that were available during my care of the patient were reviewed by me and considered in my medical decision making (see chart for details).   Staffed pt with Dr. Eulis Foster. On his history, pt states that HA occurred twice, once at her PCP office and once when arriving to ED. Both instances pt was not on O2 therapy.  Symptoms  suggest severe hypoxia.  1805 rechecked patient.  She states she feels no better after the nebulizer treatment.  She is satting from 85-88% on 4 L O2 nasal cannula.  Pulmonary exam reveals diffuse wheezing and rhonchi throughout, mild improvement of airflow but still diminished.  1810 rechecked patient with Dr. Eulis Foster.  She continues to have sats around 88% on 4L. She is not tachypneic or in respiratory distress. No tripoding. Ordered an additional nebulizer treatment for patient.  She states she feels no better, and feels nauseated.  1900 Rechecked pt after second nebulizer tx. She has less wheezing and, but she is still diminished throughout. She is still satting around 88% on 4L. No tachypnea noted, but pt appears uncomfortable.  [place] increased patient to 5 L.  She continues to sat  in the high 80s.  Continuous neb ordered. Will order bipap as well.  1940. [facility] hospitalist in room with pt. Dr. Blima Singer has seen and admitted the patient.  Final Clinical Impressions(s) / ED Diagnoses   Final diagnoses:  Acute on chronic respiratory failure with hypoxia (HCC)  COPD exacerbation ([place])   61 year old female with a history of CHF, COPD, and diabetes presents to the ED with SOB and wheezing with sats in the 60s on RA. Recently admitted to the ICU at [facility] on 12/11 for sepsis, influenza A, pneumonia, COPD exacerbation. PEx here showed decreased air movement with diffuse wheezes and rhonchi.  She also has 2+ pitting edema to the bilateral lower extremities that she states is new since being discharged.  Gave 125 solumedrol, multiple neb treatments including a continuous neb, BiPAP, lasix, and repleted potassium.  This is likely a COPD versus CHF exacerbation.  Patient stable in the ED.  [facility] hospitalist contacted for admission and have admitted patient.   ED Discharge Orders    None       Bishop Dublin 05/23/17 1954    Daleen Bo, MD 05/23/17 [id number],  [place], PA-C 07/03/17 1810    Daleen Bo, MD 07/04/17 2325

## 2017-05-23 NOTE — ED Provider Notes (Signed)
  Face-to-face evaluation   History: Presents for ongoing shortness of breath with cough which is nonproductive, weakness, malaise, anorexia, and weakness.  She also has left hip pain which is ongoing.  She was discharged from hospital 3 days ago after prolonged admission, for COPD exacerbation with influenza.  She was apparently on antibiotics during the hospitalization.  She complains of diarrhea 8-10/day, brown in color.  She states that she stopped smoking cigarettes 8 days ago.  Physical exam: Elderly appearing, alert, cooperative, calm.  No respiratory distress.  Lungs with decreased air movement bilaterally with generalized rhonchi and wheezes.  There is no increased work of breathing.  Lower legs have 2+ edema.   Patient Vitals for the past 24 hrs:  BP Temp Temp src Pulse Resp SpO2  05/23/17 1825 - 98.5 F (36.9 C) Rectal - - -  05/23/17 1819 - - - 61 20 (!) 86 %  05/23/17 1815 - - - 68 (!) 21 (!) 76 %  05/23/17 1800 (!) 170/71 - - 67 16 -  05/23/17 1715 - - - 68 (!) 22 (!) 86 %  05/23/17 1615 (!) 174/83 - - (!) 59 - 91 %  05/23/17 1600 (!) 166/72 - - 60 - 96 %  05/23/17 1530 (!) 163/71 98.5 F (36.9 C) Oral 76 20 (!) 84 %    7:08 PM Reevaluation with update and discussion. After initial assessment and treatment, an updated evaluation reveals still poor air movement, sats high 80s at this time on 4 L nasal cannula oxygen.  She has completed 2 nebulizer treatments.  We will add continues at 15 mg at this time. Daleen Bo   .Critical Care Performed by: Daleen Bo, MD Authorized by: Daleen Bo, MD   Critical care provider statement:    Critical care start time:  05/23/2017 4:35 PM   Critical care end time:  05/23/2017 7:10 PM   Critical care was necessary to treat or prevent imminent or life-threatening deterioration of the following conditions:  Respiratory failure   Critical care was time spent personally by me on the following activities:  Blood draw for specimens,  evaluation of patient's response to treatment, examination of patient, obtaining history from patient or surrogate, ordering and performing treatments and interventions, ordering and review of laboratory studies, ordering and review of radiographic studies, re-evaluation of patient's condition and review of old charts    7:19 PM-Consult complete with hospitalist. Patient case explained and discussed.  He agrees to admit patient for further evaluation and treatment. Call ended at 58: 24  MDM-evaluations consistent with COPD exacerbation with hypoxia.  The patient is requiring repeated nebulizer treatments. Oxygen saturations, abnormal despite high-dose nasal cannula treatment, patient placed on BiPAP, to help respiratory effort.  She has been prescribed CPAP, apparently, but not yet started it.  Chest x-ray indicates mild pulmonary edema with effusions however BNP is normal.  Cardiac function status is unknown.  Lasix given in ED.  Treatment initiated for COPD exacerbation.  Medical screening examination/treatment/procedure(s) were conducted as a shared visit with non-physician practitioner(s) and myself.  I personally evaluated the patient during the encounter    Daleen Bo, MD 05/23/17 2009

## 2017-05-23 NOTE — H&P (Signed)
History and Physical    Kellsie L. [name] PVV:[id number] DOB: Jun 27, 1955 DOA: 05/23/2017  PCP: Chevis Pretty, FNP   Patient coming from: Home  Chief Complaint: SOB, swelling, non-productive cough, diarrhea   HPI: Annette Suder. Wailes is a 61 y.o. female with medical history significant for CHF, COPD, type 2 diabetes mellitus, hypertension, now presenting to the emergency department for evaluation of shortness of breath, swelling, and diarrhea.  Patient reports that she was admitted to an outside hospital from 05/13/2017 until 05/20/2017 with acute respiratory failure attributed to influenza A and acute CHF.  She reports that "liver levels" were 7000 at the time, improved to 2000 by time of discharge, and viral hepatitis testing was negative.  She also had melena with stable H&H.  Since time of discharge, patient has been short of breath with minimal exertion and has been experiencing numerous episodes of diarrhea daily.  There is no blood noted in the stool, but it is dark.  Patient denies fevers or chills, reports a cough that is nonproductive, and reports increased lower extremity swelling bilaterally since the hospital discharge.  She was reportedly saturating in the 70s when she saw her PCP today and directed to the ED for further evaluation.  Denies chest pain or palpitations.  Had a headache earlier that is resolved.  No focal numbness or weakness and no change in vision or hearing.  ED Course: Upon arrival to the ED, patient is found to be afebrile, saturating 76% on room air, slightly tachypneic, and modestly hypertensive.  EKG features a sinus rhythm with right axis deviation and chest x-ray is notable for cardiomegaly, pulmonary vascular congestion, and diffuse interstitial pulmonary edema with small bilateral effusions.  Chemistry panel is notable for potassium 3.1 and glucose of 205.  CBC reveals a mild leukocytosis to 11,500.  BNP is slightly elevated 242.  Patient was placed on  BiPAP, treated with continuous albuterol x3, Atrovent, 40 mEq oral potassium, 125 mg IV Solu-Medrol, and 40 mg IV Lasix in the ED.  She has remained hemodynamically stable, but continues to require BiPAP and will be admitted to the stepdown unit for ongoing evaluation and management of acute on chronic respiratory failure suspected secondary to acute on chronic CHF and likely exacerbation and COPD.  Review of Systems:  All other systems reviewed and apart from HPI, are negative.  Past Medical History:  Diagnosis Date  . Anal warts    anal warts  . Anxiety   . Congestive heart failure (CHF) ([place])   . COPD (chronic obstructive pulmonary disease) ([place])   . Diabetes mellitus without complication ([place])   . Hypertriglyceridemia   . Obesity    metabolic syndrome   . Osteopenia   . Vitamin D deficiency     Past Surgical History:  Procedure Laterality Date  . ABDOMINAL HYSTERECTOMY    . FRACTURE SURGERY     Right leg and ankle  . lt ganglion cyst removal  09/2007     reports that she has been smoking.  she has never used smokeless tobacco. She reports that she does not drink alcohol or use drugs.  Allergies  Allergen Reactions  . Celebrex [Celecoxib]   . Voltaren [Diclofenac Sodium]     Seizures     Family History  Problem Relation Age of Onset  . Pneumonia Mother   . Diabetes Mother      Prior to Admission medications   Medication Sig Start Date End Date Taking? Authorizing Provider  albuterol (PROVENTIL) (2.5 MG/3ML)  0.083% nebulizer solution Take 3 mLs (2.5 mg total) by nebulization every 6 (six) hours as needed for wheezing or shortness of breath. 04/23/16   Hassell Done, Mary-Margaret, FNP  atorvastatin (LIPITOR) 40 MG tablet Take 1 tablet (40 mg total) by mouth daily at 6 PM. 12/27/16   Hassell Done, Mary-Margaret, FNP  Dulaglutide (TRULICITY) 1.5 WC/5.8NI SOPN Inject 1.5 mg into the skin once a week. 03/03/17   Chevis Pretty, FNP  fenofibrate micronized (LOFIBRA) 134 MG  capsule Take 1 capsule (134 mg total) by mouth daily before breakfast. 04/04/17 03/30/18  Hassell Done, Mary-Margaret, FNP  fluticasone furoate-vilanterol (BREO ELLIPTA) 100-25 MCG/INH AEPB INHALE 1 PUFF INTO LUNGS DAILY AS DIRECTED 12/27/16   Hassell Done, Mary-Margaret, FNP  furosemide (LASIX) 40 MG tablet Take 1 tablet (40 mg total) by mouth daily. 05/08/17   Hassell Done Mary-Margaret, FNP  glimepiride (AMARYL) 4 MG tablet Take 1 tablet (4 mg total) by mouth daily before breakfast. 12/27/16   Chevis Pretty, FNP  metFORMIN (GLUCOPHAGE) 1000 MG tablet Take 1 tablet (1,000 mg total) by mouth 2 (two) times daily with a meal. 12/27/16   Chevis Pretty, FNP  [facility] VERIO test strip CHECK BLOOD SUGAR ONCE DAILY OR AS DIRECTED 11/24/15   Chevis Pretty, FNP    Physical Exam: Vitals:   05/23/17 1825 05/23/17 1900 05/23/17 1915 05/23/17 1928  BP:  (!) 176/59    Pulse:  82 70   Resp:  20 (!) 21   Temp: 98.5 F (36.9 C)     TempSrc: Rectal     SpO2:  (!) 87% (!) 88% 91%      Constitutional: Mild tachypnea and dyspnea with speech, calm, no diaphoresis  Eyes: PERTLA, lids and conjunctivae normal ENMT: Mucous membranes are moist. Posterior pharynx clear of any exudate or lesions.   Neck: normal, supple, no masses, no thyromegaly Respiratory: Diffuse rales, occasional wheeze. No accessory muscle use.  Cardiovascular: S1 & S2 heard, regular rate and rhythm. 2+ pretibial edema bilaterally. No significant JVD. Abdomen: No distension, soft, mild generalized tenderness without rebound pain or guarding. Bowel sounds active.  Musculoskeletal: no clubbing / cyanosis. No joint deformity upper and lower extremities.  Skin: no significant rashes, lesions, ulcers. Warm, dry, well-perfused. Neurologic: CN 2-12 grossly intact. Sensation intact. Strength 5/5 in all 4 limbs.  Psychiatric: Alert and oriented x 3. Calm, cooperative.     Labs on Admission: I have personally reviewed following labs and imaging  studies  CBC: Recent Labs  Lab 05/23/17 1654  WBC 11.6*  HGB 14.2  HCT 45.0  MCV 97.0  PLT 778   Basic Metabolic Panel: Recent Labs  Lab 05/23/17 1654  NA 141  K 3.1*  CL 93*  CO2 41*  GLUCOSE 205*  BUN 10  CREATININE 0.89  CALCIUM 8.9   GFR: Estimated Creatinine Clearance: 74.8 mL/min (by C-G formula based on SCr of 0.89 mg/dL). Liver Function Tests: No results for input(s): AST, ALT, ALKPHOS, BILITOT, PROT, ALBUMIN in the last 168 hours. No results for input(s): LIPASE, AMYLASE in the last 168 hours. No results for input(s): AMMONIA in the last 168 hours. Coagulation Profile: No results for input(s): INR, PROTIME in the last 168 hours. Cardiac Enzymes: No results for input(s): CKTOTAL, CKMB, CKMBINDEX, TROPONINI in the last 168 hours. BNP (last 3 results) No results for input(s): PROBNP in the last 8760 hours. HbA1C: No results for input(s): HGBA1C in the last 72 hours. CBG: No results for input(s): GLUCAP in the last 168 hours. Lipid Profile: No results for  input(s): CHOL, HDL, LDLCALC, TRIG, CHOLHDL, LDLDIRECT in the last 72 hours. Thyroid Function Tests: No results for input(s): TSH, T4TOTAL, FREET4, T3FREE, THYROIDAB in the last 72 hours. Anemia Panel: No results for input(s): VITAMINB12, FOLATE, FERRITIN, TIBC, IRON, RETICCTPCT in the last 72 hours. Urine analysis:    Component Value Date/Time   BILIRUBINUR neg 07/25/2014 0838   PROTEINUR neg 07/25/2014 0838   UROBILINOGEN negative 07/25/2014 0838   NITRITE pos 07/25/2014 0838   LEUKOCYTESUR Trace 07/25/2014 0838   Sepsis Labs: _0 (procalcitonin:4,lacticidven:4) )No results found for this or any previous visit (from the past 240 hour(s)).   Radiological Exams on Admission: Dg Chest 2 View  Result Date: 05/23/2017 CLINICAL DATA:  Shortness of breath EXAM: CHEST  2 VIEW COMPARISON:  05/14/2017 FINDINGS: Small bilateral pleural effusions. Borderline to mild cardiomegaly with vascular  congestion and diffuse interstitial opacity consistent with pulmonary edema. No focal consolidation. No pneumothorax. IMPRESSION: Cardiomegaly with vascular congestion and diffuse interstitial pulmonary edema and small bilateral pleural effusion. Electronically Signed   By: Donavan Foil M.D.   On: 05/23/2017 17:59    EKG: Independently reviewed. Sinus rhythm, RAD.   Assessment/Plan  1. Acute hypoxic respiratory failure   - Pt presents with acute respiratory distress, saturating 76% on rm air - She has hx of COPD and CHF, not requiring supplemental O2 at home - There was a lot of wheezing on initial presentation, minimal wheezing on admission after multiple CAT's  - Suspect acute on chronic CHF may be primary problem, in addition to aeCOPD  - Continue BiPAP prn, address CHF and COPD as below   2. Acute on chronic CHF  - No echo report on file  - Presents with increased peripheral edema, noted to have diffuse rales and interstitial edema on CXR  - Treated with 40 mg IV Lasix in ED  - Continue cardiac monitoring, SLIV, fluid-restrict diet, follow daily wts and I/O's, continue diuresis with Lasix 40 mg IV q12h, check echocardiogram, start Coreg for HTN, consider ACE/ARB   3. COPD with acute exacerbation  - Pt presents with sat 76%, wheezes, non-productive cough - Treated with multiple continuous albuterol nebs, BiPAP, 125 mg IV Solu-Medrol in ED  - Minimal wheezing by time of admission  - Continue Breo, DuoNeb, systemic steroid, supplemental O2, wean from BiPAP as tolerated    4. Diarrhea; melena  - Pt reports several days with multiple episodes of watery diarrhea  - Recent hospitalization with abx, C diff testing ordered, maintain enteric precautions, follow fluid-status and lytes    5. Hypokalemia  - Potassium is 3.1 on admission  - Treated with 40 mEq oral potassium on admission, given 1 g IV magnesium  - Continue cardiac monitoring, repeat chem panel in am   6. Hypertension  - BP  elevated, has documented hx of HTN, but no antihypertensives  - Start low-dose Coreg in setting of CHF, consider ACE/ARB, start hydralazine IVP's prn, continue diuresis   7. Type II DM  - A1c was 7.3% in November '18  - Managed at home with Trulicity, metformin, and glimepiride, held on admission  - She is on systemic steroids as noted above  - Check CBG's, start a moderate-intensity SSI and adjust as needed   8. Elevated LFT's  - Pt reports that "liver levels were 7000" on recent admission, improved to 2000 by discharge, and viral hepatitis testing negative  - Working to obtain records  - No jaundice, mild generalized abd tenderness, check LFT's  DVT prophylaxis: SCD's Code Status: Full  Family Communication: Family updated at bedside Disposition Plan: Admit to SDU Consults called: None Admission status: Inpatient    [name], MD [facility] Pager [phone]  If 7PM-7AM, please contact night-coverage [email or web address] Password [facility]  05/23/2017, 8:03 PM

## 2017-05-23 NOTE — ED Notes (Signed)
EDP and PA made aware patient ox 86-88% on 5L EDP recommendation cont neb. RT made aware, RT laid eyes of patient.

## 2017-05-23 NOTE — Progress Notes (Signed)
   Subjective:    Patient ID: Annette Gibson, female    DOB: [birth year], 61 y.o.   MRN: [id number]  HPI Pt presents to the office today for hospital follow up. Pt went to the ED with fever, confusion, weakness, and cough on 05/13/17 and admitted to the ICU and was discharged on 05/20/17. Pt was diagnosed with Sepsis, Influenza A, Pneumonia, COPD exacerbation, Heme-positive stools, and elevated LFT's   PT complaining of a headache today. States feels like my head is "going to bust" and "II've never had a headache like this". Unable to tolerate the lights in the room on.     Review of Systems  Constitutional: Positive for fatigue.  Respiratory: Positive for cough, shortness of breath and wheezing.   Neurological: Positive for dizziness and headaches.       Objective:   Physical Exam  Constitutional: She is oriented to person, place, and time. She appears well-developed and well-nourished. She has a sickly appearance. She appears ill.  Eyes: Pupils are equal, round, and reactive to light.  Cardiovascular: Normal rate.  Pulmonary/Chest: She has wheezes. She has rhonchi.  Musculoskeletal:  Generalized weakness  Neurological: She is alert and oriented to person, place, and time.    BP 138/78   Pulse 74   Temp 98.8 F (37.1 C) (Oral)   Resp (!) 24   Ht _0  (1.626 m)   Wt 212 lb 12.8 oz (96.5 kg)   SpO2 92% Comment: 3L O2  BMI 36.53 kg/m        Assessment & Plan:  1. Mucopurulent chronic bronchitis ([place])  2. COPD exacerbation ([place])  3. Hospital discharge follow-up  4. Community acquired pneumonia, unspecified laterality  5. Acute intractable headache, unspecified headache type   Pt having "worse headache of my life" and continues to have SOB will advise pt to go to the ED. Pt's daughter states she will drive the patient and declines EMS at this time. States she will go to [facility].    Evelina Dun, FNP

## 2017-05-23 NOTE — ED Triage Notes (Signed)
Pt sent here by her PCP for further evaluation. Was just admitted for flu and sepsis and discharged on Tuesday. At home pt has been having difficulty keeping her O2 saturation levels up. Pt reports persistent headaches. Pt noted to be 60% on room air.

## 2017-05-23 NOTE — ED Notes (Signed)
Admitting MD at bedside.

## 2017-05-23 NOTE — ED Notes (Signed)
EDp aware of patients O2 in 80's

## 2017-05-24 ENCOUNTER — Other Ambulatory Visit: Payer: Self-pay

## 2017-05-24 ENCOUNTER — Inpatient Hospital Stay (HOSPITAL_COMMUNITY): Payer: Self-pay

## 2017-05-24 DIAGNOSIS — J9621 Acute and chronic respiratory failure with hypoxia: Secondary | ICD-10-CM

## 2017-05-24 DIAGNOSIS — R0600 Dyspnea, unspecified: Secondary | ICD-10-CM

## 2017-05-24 LAB — GLUCOSE, CAPILLARY
GLUCOSE-CAPILLARY: 156 mg/dL — AB (ref 65–99)
GLUCOSE-CAPILLARY: 268 mg/dL — AB (ref 65–99)
GLUCOSE-CAPILLARY: 279 mg/dL — AB (ref 65–99)
GLUCOSE-CAPILLARY: 280 mg/dL — AB (ref 65–99)
GLUCOSE-CAPILLARY: 370 mg/dL — AB (ref 65–99)
Glucose-Capillary: 282 mg/dL — ABNORMAL HIGH (ref 65–99)

## 2017-05-24 LAB — COMPREHENSIVE METABOLIC PANEL
ALBUMIN: 3 g/dL — AB (ref 3.5–5.0)
ALK PHOS: 64 U/L (ref 38–126)
ALT: 114 U/L — ABNORMAL HIGH (ref 14–54)
ANION GAP: 11 (ref 5–15)
AST: 42 U/L — ABNORMAL HIGH (ref 15–41)
BUN: 10 mg/dL (ref 6–20)
CALCIUM: 8.7 mg/dL — AB (ref 8.9–10.3)
CHLORIDE: 92 mmol/L — AB (ref 101–111)
CO2: 38 mmol/L — AB (ref 22–32)
Creatinine, Ser: 1.14 mg/dL — ABNORMAL HIGH (ref 0.44–1.00)
GFR calc non Af Amer: 51 mL/min — ABNORMAL LOW (ref >60)
GFR, EST AFRICAN AMERICAN: 59 mL/min — AB (ref >60)
GLUCOSE: 285 mg/dL — AB (ref 65–99)
Potassium: 2.8 mmol/L — ABNORMAL LOW (ref 3.5–5.1)
SODIUM: 141 mmol/L (ref 135–145)
Total Bilirubin: 0.8 mg/dL (ref 0.3–1.2)
Total Protein: 5.8 g/dL — ABNORMAL LOW (ref 6.5–8.1)

## 2017-05-24 LAB — RESPIRATORY PANEL BY PCR
ADENOVIRUS-RVPPCR: NOT DETECTED
BORDETELLA PERTUSSIS-RVPCR: NOT DETECTED
CORONAVIRUS 229E-RVPPCR: NOT DETECTED
CORONAVIRUS HKU1-RVPPCR: NOT DETECTED
CORONAVIRUS NL63-RVPPCR: NOT DETECTED
CORONAVIRUS OC43-RVPPCR: NOT DETECTED
Chlamydophila pneumoniae: NOT DETECTED
Influenza A: NOT DETECTED
Influenza B: NOT DETECTED
METAPNEUMOVIRUS-RVPPCR: NOT DETECTED
Mycoplasma pneumoniae: NOT DETECTED
PARAINFLUENZA VIRUS 1-RVPPCR: NOT DETECTED
PARAINFLUENZA VIRUS 2-RVPPCR: NOT DETECTED
PARAINFLUENZA VIRUS 3-RVPPCR: NOT DETECTED
Parainfluenza Virus 4: NOT DETECTED
RHINOVIRUS / ENTEROVIRUS - RVPPCR: NOT DETECTED
Respiratory Syncytial Virus: NOT DETECTED

## 2017-05-24 LAB — ECHOCARDIOGRAM COMPLETE
HEIGHTINCHES: 64 in
Weight: 3308.66 oz

## 2017-05-24 LAB — CBC
HCT: 43.3 % (ref 36.0–46.0)
HEMOGLOBIN: 13.5 g/dL (ref 12.0–15.0)
MCH: 30.1 pg (ref 26.0–34.0)
MCHC: 31.2 g/dL (ref 30.0–36.0)
MCV: 96.7 fL (ref 78.0–100.0)
PLATELETS: 247 10*3/uL (ref 150–400)
RBC: 4.48 MIL/uL (ref 3.87–5.11)
RDW: 13.5 % (ref 11.5–15.5)
WBC: 10.4 10*3/uL (ref 4.0–10.5)

## 2017-05-24 LAB — HIV ANTIBODY (ROUTINE TESTING W REFLEX): HIV SCREEN 4TH GENERATION: NONREACTIVE

## 2017-05-24 LAB — MAGNESIUM: MAGNESIUM: 1.4 mg/dL — AB (ref 1.7–2.4)

## 2017-05-24 LAB — MRSA PCR SCREENING: MRSA by PCR: NEGATIVE

## 2017-05-24 MED ORDER — INSULIN ASPART 100 UNIT/ML ~~LOC~~ SOLN
0.0000 [IU] | Freq: Every day | SUBCUTANEOUS | Status: DC
  Administered 2017-05-24 – 2017-05-25 (×2): 3 [IU] via SUBCUTANEOUS
  Administered 2017-05-26 – 2017-05-27 (×2): 4 [IU] via SUBCUTANEOUS

## 2017-05-24 MED ORDER — INSULIN ASPART 100 UNIT/ML ~~LOC~~ SOLN
0.0000 [IU] | Freq: Three times a day (TID) | SUBCUTANEOUS | Status: DC
  Administered 2017-05-24: 8 [IU] via SUBCUTANEOUS
  Administered 2017-05-25 (×2): 11 [IU] via SUBCUTANEOUS
  Administered 2017-05-25 – 2017-05-26 (×3): 8 [IU] via SUBCUTANEOUS
  Administered 2017-05-26: 11 [IU] via SUBCUTANEOUS
  Administered 2017-05-27: 8 [IU] via SUBCUTANEOUS
  Administered 2017-05-27: 15 [IU] via SUBCUTANEOUS
  Administered 2017-05-27: 11 [IU] via SUBCUTANEOUS
  Administered 2017-05-28: 8 [IU] via SUBCUTANEOUS
  Administered 2017-05-28: 5 [IU] via SUBCUTANEOUS

## 2017-05-24 MED ORDER — ALBUTEROL SULFATE (2.5 MG/3ML) 0.083% IN NEBU: 2.5000 mg | INHALATION_SOLUTION | RESPIRATORY_TRACT | Status: DC | PRN

## 2017-05-24 MED ORDER — POTASSIUM CHLORIDE CRYS ER 20 MEQ PO TBCR
20.0000 meq | EXTENDED_RELEASE_TABLET | Freq: Three times a day (TID) | ORAL | Status: AC
  Administered 2017-05-24 – 2017-05-26 (×6): 20 meq via ORAL
  Filled 2017-05-24 (×6): qty 1

## 2017-05-24 MED ORDER — MAGNESIUM SULFATE 2 GM/50ML IV SOLN
2.0000 g | Freq: Once | INTRAVENOUS | Status: AC
  Administered 2017-05-24: 2 g via INTRAVENOUS
  Filled 2017-05-24: qty 50

## 2017-05-24 MED ORDER — BUDESONIDE 0.25 MG/2ML IN SUSP
0.2500 mg | Freq: Two times a day (BID) | RESPIRATORY_TRACT | Status: DC
  Administered 2017-05-24 – 2017-05-28 (×8): 0.25 mg via RESPIRATORY_TRACT
  Filled 2017-05-24 (×9): qty 2

## 2017-05-24 MED ORDER — ACETAMINOPHEN 500 MG PO TABS: 500.0000 mg | ORAL_TABLET | ORAL | Status: DC | PRN

## 2017-05-24 MED ORDER — POTASSIUM CHLORIDE 10 MEQ/100ML IV SOLN
10.0000 meq | INTRAVENOUS | Status: DC
  Administered 2017-05-24 (×3): 10 meq via INTRAVENOUS
  Filled 2017-05-24 (×3): qty 100

## 2017-05-24 MED ORDER — METHYLPREDNISOLONE SODIUM SUCC 40 MG IJ SOLR
40.0000 mg | Freq: Two times a day (BID) | INTRAMUSCULAR | Status: DC
  Administered 2017-05-24 – 2017-05-28 (×8): 40 mg via INTRAVENOUS
  Filled 2017-05-24 (×8): qty 1

## 2017-05-24 NOTE — Progress Notes (Signed)
[place] TEAM 1 - Stepdown/ICU TEAM  Annette Gibson  LJQ:[id number] DOB: 24-Jan-1956 DOA: 05/23/2017 PCP: Chevis Pretty, FNP    Brief Narrative:  61 y.o. female with history of CHF, COPD, DM2, and HTN who presented to the ED w/ SOB, swelling, and diarrhea.  She was admitted to an outside hospital from 05/13/2017 until 05/20/2017 with acute respiratory failure attributed to influenza A and acute CHF.  She reports that "liver levels" were 7000 at the time, improved to 2000 by time of discharge, and viral hepatitis testing was negative.  She also had melena with stable H&H.  Since time of discharge, patient had been short of breath with minimal exertion and had been experiencing numerous episodes of diarrhea daily.  She was saturating in the 70s when she saw her PCP and directed to the ED for further evaluation.    Upon arrival to the ED, patient is found to be afebrile, saturating 76% on room air, slightly tachypneic, and modestly hypertensive.  EKG featured a sinus rhythm with right axis deviation and chest x-ray was notable for cardiomegaly, pulmonary vascular congestion, and diffuse interstitial pulmonary edema with small bilateral effusions.  She required use of BIPAP to improve her oxygenation.    Significant Events: 12/21 admit   Subjective: The patient is resting comfortably in bed.  Though she continues to require high level oxygen support she states her breathing has improved.  She does not yet feel that she is back to her baseline.  She has not had a bowel movement since her admission and denies abdominal pain or cramps.  She denies chest pain.  Assessment & Plan:  Acute hypoxic respiratory failure   suspect acute on chronic CHF in addition to aeCOPD - continues to require high level O2 support   Acute exacerbation of Chronic CHF  No TTE on file - follow daily wts and I/O's - continue diuresis - TTE pending   COPD with acute exacerbation  Improved w/ usual tx     Diarrhea - melena  though I agree that the patient is at risk for C. difficile she has not had a bowel movement since her admission and currently appears to be constipated - I therefore feel that a colitis is unlikely - discontinue contact precautions and follow clinically - pt is not anemic  Hypokalemia  likely due to poor intake and GI losses, as well as diuretic - supplement and follow  Hypomagnesemia Replace and follow   Hypertension  BP variable - follow w/o change for today   DM2  A1c 7.3% November '18   Elevated LFT's  Pt reported "liver levels were 7000" on recent admission, improved to 2000 by discharge, and viral hepatitis testing negative - LFTs not significantly elevated at this time     DVT prophylaxis: SCDs Code Status: FULL CODE Family Communication: no family present at time of exam  Disposition Plan: SDU  Consultants:  none  Antimicrobials:  none   Objective: Blood pressure (!) 142/65, pulse 63, temperature 98.2 F (36.8 C), temperature source Oral, resp. rate 16, height _0  (1.626 m), weight 93.8 kg (206 lb 12.7 oz), SpO2 92 %.  Intake/Output Summary (Last 24 hours) at 05/24/2017 1149 Last data filed at 05/24/2017 0747 Gross per 24 hour  Intake 400 ml  Output 2450 ml  Net -2050 ml   Filed Weights   05/23/17 2100 05/24/17 0500  Weight: 96.7 kg (213 lb 3 oz) 93.8 kg (206 lb 12.7 oz)    Examination: General:  No acute respiratory distress Lungs: fine diffuse crackles - no wheezing  Cardiovascular: Regular rate and rhythm without murmur  Abdomen: Nontender, overweights, soft, bowel sounds positive, no rebound, no ascites, no appreciable mass Extremities: 2+ B LE edema   CBC: Recent Labs  Lab 05/23/17 1654 05/24/17 0318  WBC 11.6* 10.4  HGB 14.2 13.5  HCT 45.0 43.3  MCV 97.0 96.7  PLT 228 381   Basic Metabolic Panel: Recent Labs  Lab 05/23/17 1654 05/24/17 0318  NA 141 141  K 3.1* 2.8*  CL 93* 92*  CO2 41* 38*  GLUCOSE 205*  285*  BUN 10 10  CREATININE 0.89 1.14*  CALCIUM 8.9 8.7*  MG  --  1.4*   GFR: Estimated Creatinine Clearance: 57.5 mL/min (A) (by C-G formula based on SCr of 1.14 mg/dL (H)).  Liver Function Tests: Recent Labs  Lab 05/23/17 2046 05/24/17 0318  AST 29 42*  ALT 123* 114*  ALKPHOS 66 64  BILITOT 1.4* 0.8  PROT 6.0* 5.8*  ALBUMIN 3.2* 3.0*    Cardiac Enzymes: Recent Labs  Lab 05/23/17 2046  TROPONINI 0.05*    HbA1C: Hemoglobin A1C  Date/Time Value Ref Range Status  05/26/2015 08:20 AM 8.2  Final    Comment:    4.8-5.6  02/15/2015 08:18 AM 8.3  Final    Comment:    normal range 4.8-5.6%    CBG: Recent Labs  Lab 05/23/17 2225 05/24/17 0011 05/24/17 0320 05/24/17 0737 05/24/17 1122  GLUCAP 257* 370* 282* 156* 280*    Recent Results (from the past 240 hour(s))  Respiratory Panel by PCR     Status: None   Collection Time: 05/24/17  4:36 AM  Result Value Ref Range Status   Adenovirus NOT DETECTED NOT DETECTED Final   Coronavirus 229E NOT DETECTED NOT DETECTED Final   Coronavirus HKU1 NOT DETECTED NOT DETECTED Final   Coronavirus NL63 NOT DETECTED NOT DETECTED Final   Coronavirus OC43 NOT DETECTED NOT DETECTED Final   Metapneumovirus NOT DETECTED NOT DETECTED Final   Rhinovirus / Enterovirus NOT DETECTED NOT DETECTED Final   Influenza A NOT DETECTED NOT DETECTED Final   Influenza B NOT DETECTED NOT DETECTED Final   Parainfluenza Virus 1 NOT DETECTED NOT DETECTED Final   Parainfluenza Virus 2 NOT DETECTED NOT DETECTED Final   Parainfluenza Virus 3 NOT DETECTED NOT DETECTED Final   Parainfluenza Virus 4 NOT DETECTED NOT DETECTED Final   Respiratory Syncytial Virus NOT DETECTED NOT DETECTED Final   Bordetella pertussis NOT DETECTED NOT DETECTED Final   Chlamydophila pneumoniae NOT DETECTED NOT DETECTED Final   Mycoplasma pneumoniae NOT DETECTED NOT DETECTED Final  MRSA PCR Screening     Status: None   Collection Time: 05/24/17  4:36 AM  Result Value Ref  Range Status   MRSA by PCR NEGATIVE NEGATIVE Final    Comment:        The GeneXpert MRSA Assay (FDA approved for NASAL specimens only), is one component of a comprehensive MRSA colonization surveillance program. It is not intended to diagnose MRSA infection nor to guide or monitor treatment for MRSA infections.      Scheduled Meds: . atorvastatin  40 mg Oral q1800  . carvedilol  3.125 mg Oral BID WC  . fluticasone furoate-vilanterol  1 puff Inhalation Daily  . furosemide  40 mg Intravenous Q12H  . insulin aspart  0-15 Units Subcutaneous Q4H  . ipratropium-albuterol  3 mL Nebulization TID  . methylPREDNISolone (SOLU-MEDROL) injection  40 mg Intravenous Q8H  .  sodium chloride flush  3 mL Intravenous Q12H     LOS: 1 day   Cherene Altes, MD [facility] Office  [phone] Pager - Text Page per Amion as per below:  On-Call/Text Page:      [email or web address]      password [email or web address]  If 7PM-7AM, please contact night-coverage [email or web address] Password [email or web address] 05/24/2017, 11:49 AM

## 2017-05-24 NOTE — Progress Notes (Signed)
  Echocardiogram 2D Echocardiogram has been performed.  Ladeana Laplant T Montzerrat Brunell 05/24/2017, 10:53 AM

## 2017-05-24 NOTE — Progress Notes (Signed)
Nutrition Brief Note  RD consulted for nutrition screen per COPD Gold protocol.   Wt Readings from Last 15 Encounters:  05/24/17 206 lb 12.7 oz (93.8 kg)  05/23/17 212 lb 12.8 oz (96.5 kg)  04/04/17 212 lb (96.2 kg)  12/27/16 220 lb (99.8 kg)  09/20/16 220 lb (99.8 kg)  06/21/16 212 lb (96.2 kg)  04/23/16 220 lb (99.8 kg)  03/22/16 219 lb (99.3 kg)  12/08/15 220 lb (99.8 kg)  10/13/15 223 lb (101.2 kg)  09/01/15 226 lb 8 oz (102.7 kg)  08/25/15 226 lb 6.4 oz (102.7 kg)  05/26/15 223 lb (101.2 kg)  02/15/15 218 lb (98.9 kg)  11/02/14 215 lb (97.5 kg)   Body mass index is 35.5 kg/m. Patient meets criteria for obese based on current BMI.   Pt says she currently has a good appetite. She points to her lunch tray that is approximately 80% completed.  As for eating prior to admission, she states doesn't remember. She says she has very little memory of anything that has occurred the last few weeks. She says she was found on the toilet by her daughter who took her to [place]. She remembers waking up in the ICU. At home, she takes b12 and vit D. She says her daughter provide for her at home and provide her with a strict low sodium diet  She was admitted at 212 lbs and is now 53, but she has been heavily diuresed. Long term, her weight has been 210-225 lbs. Some weight loss would be beneficial.   Pt currently reports good appetite and that she follows a low sodium diet. She has lost some weight, but more likely due to fluid losses. Some wt loss will be beneficial. No nutrition interventions warranted at this time. If nutrition issues arise, please consult RD.   Burtis Junes RD, LDN, CNSC Clinical Nutrition Pager: [phone] 05/24/2017 12:49 PM

## 2017-05-24 NOTE — Evaluation (Signed)
Physical Therapy Evaluation Patient Details Name: Annette Gibson MRN: [id number] DOB: [birth year] Today's Date: 05/24/2017   History of Present Illness  Pt is a 61 y.o. female who presented to ED on 05/23/17 with increased SOB; suspected CHF and COPD exacerbation. Of note, had recently been admitted to OSH 12/11-18 with acute respiratory failure attributed to influenza A and acute CHF. Pertinent PMH includes HTN, DM2, COPD, osteopenia.    Clinical Impression  Pt presents with decreased activity tolerance and an overall decrease in functional mobility secondary to above. PTA, pt indep and lives with daughter who can be available for 24/7 support if needed. Today, pt able to amb 150' with no DME and supervision; declining further amb secondary to fatigue. SpO2 down to 86% on 10L O2 [place], returning to 88% with standing rest break and deep breathing (RN notified). Discussed and encouraged potential for smoking cessation since pt has not smoked in 9 days since hospital admissions, but pt undecided if she will quit. Pt would benefit from continued acute PT services to maximize functional mobility and independence prior to d/c home.     Follow Up Recommendations No PT follow up;Supervision for mobility/OOB    Equipment Recommendations  None recommended by PT    Recommendations for Other Services       Precautions / Restrictions        Mobility  Bed Mobility Overal bed mobility: Modified Independent Bed Mobility: Supine to Sit     Supine to sit: Modified independent (Device/Increase time);HOB elevated        Transfers Overall transfer level: Needs assistance Equipment used: None Transfers: Sit to/from Stand Sit to Stand: Supervision            Ambulation/Gait Ambulation/Gait assistance: Supervision Ambulation Distance (Feet): 150 Feet Assistive device: None Gait Pattern/deviations: Step-through pattern;Decreased stride length Gait velocity: Decreased   General Gait  Details: Amb 150' with supervision for safety; SpO2 down to 86% on 10L O2 [place], returning to 88-89% with standing rest break and deep breathing. Returned to 91% upon sitting  Stairs            Wheelchair Mobility    Modified Rankin (Stroke Patients Only)       Balance Overall balance assessment: Needs assistance   Sitting balance-Leahy Scale: Good       Standing balance-Leahy Scale: Fair                               Pertinent Vitals/Pain Pain Assessment: No/denies pain    Home Living Family/patient expects to be discharged to:: Private residence Living Arrangements: Children Available Help at Discharge: Family;Available PRN/intermittently Type of Home: House Home Access: Stairs to enter Entrance Stairs-Rails: Psychiatric nurse of Steps: 3 Home Layout: One level Home Equipment: Electronics engineer Comments: Daughter works near home, can be available for 24/7 support if needed    Prior Function Level of Independence: Independent         Comments: Does not work. Reports primarily sedentary lifestyle; enjoys playing games on phone     Hand Dominance        Extremity/Trunk Assessment   Upper Extremity Assessment Upper Extremity Assessment: Overall WFL for tasks assessed    Lower Extremity Assessment Lower Extremity Assessment: Overall WFL for tasks assessed       Communication   Communication: No difficulties  Cognition Arousal/Alertness: Awake/alert Behavior During Therapy: WFL for tasks assessed/performed Overall Cognitive Status: Within Functional Limits  for tasks assessed                                        General Comments      Exercises     Assessment/Plan    PT Assessment Patient needs continued PT services  PT Problem List Decreased activity tolerance;Decreased balance;Decreased mobility;Cardiopulmonary status limiting activity       PT Treatment Interventions DME  instruction;Gait training;Stair training;Functional mobility training;Therapeutic activities;Therapeutic exercise;Balance training;Patient/family education    PT Goals (Current goals can be found in the Care Plan section)  Acute Rehab PT Goals Patient Stated Goal: Return home PT Goal Formulation: With patient Time For Goal Achievement: 06/07/17 Potential to Achieve Goals: Good    Frequency Min 3X/week   Barriers to discharge        Co-evaluation               AM-PAC PT "6 Clicks" Daily Activity  Outcome Measure Difficulty turning over in bed (including adjusting bedclothes, sheets and blankets)?: None Difficulty moving from lying on back to sitting on the side of the bed? : None Difficulty sitting down on and standing up from a chair with arms (e.g., wheelchair, bedside commode, etc,.)?: None Help needed moving to and from a bed to chair (including a wheelchair)?: A Little Help needed walking in hospital room?: A Little Help needed climbing 3-5 steps with a railing? : A Little 6 Click Score: 21    End of Session Equipment Utilized During Treatment: Gait belt;Oxygen Activity Tolerance: Patient tolerated treatment well;Patient limited by fatigue Patient left: in bed;with call bell/phone within reach Nurse Communication: Mobility status PT Visit Diagnosis: Other abnormalities of gait and mobility (R26.89)    Time: 2060-1561 PT Time Calculation (min) (ACUTE ONLY): 28 min   Charges:   PT Evaluation $PT Eval Moderate Complexity: 1 Mod PT Treatments $Gait Training: 8-22 mins   PT G Codes:       Mabeline Caras, PT, DPT Acute Rehab Services  Pager: [place] 05/24/2017, 2:39 PM

## 2017-05-25 DIAGNOSIS — E1165 Type 2 diabetes mellitus with hyperglycemia: Secondary | ICD-10-CM

## 2017-05-25 DIAGNOSIS — Z720 Tobacco use: Secondary | ICD-10-CM

## 2017-05-25 DIAGNOSIS — R945 Abnormal results of liver function studies: Secondary | ICD-10-CM

## 2017-05-25 DIAGNOSIS — E118 Type 2 diabetes mellitus with unspecified complications: Secondary | ICD-10-CM

## 2017-05-25 DIAGNOSIS — G4733 Obstructive sleep apnea (adult) (pediatric): Secondary | ICD-10-CM

## 2017-05-25 LAB — CBC
HEMATOCRIT: 40.9 % (ref 36.0–46.0)
HEMOGLOBIN: 12.9 g/dL (ref 12.0–15.0)
MCH: 30.4 pg (ref 26.0–34.0)
MCHC: 31.5 g/dL (ref 30.0–36.0)
MCV: 96.2 fL (ref 78.0–100.0)
Platelets: 270 10*3/uL (ref 150–400)
RBC: 4.25 MIL/uL (ref 3.87–5.11)
RDW: 13.7 % (ref 11.5–15.5)
WBC: 24.5 10*3/uL — AB (ref 4.0–10.5)

## 2017-05-25 LAB — GLUCOSE, CAPILLARY
GLUCOSE-CAPILLARY: 294 mg/dL — AB (ref 65–99)
GLUCOSE-CAPILLARY: 278 mg/dL — AB (ref 65–99)
Glucose-Capillary: 348 mg/dL — ABNORMAL HIGH (ref 65–99)
Glucose-Capillary: 310 mg/dL — ABNORMAL HIGH (ref 65–99)

## 2017-05-25 LAB — HEMOGLOBIN A1C
Hgb A1c MFr Bld: 7.9 % — ABNORMAL HIGH (ref 4.8–5.6)
MEAN PLASMA GLUCOSE: 180.03 mg/dL

## 2017-05-25 LAB — COMPREHENSIVE METABOLIC PANEL
ALT: 83 U/L — ABNORMAL HIGH (ref 14–54)
AST: 29 U/L (ref 15–41)
Albumin: 2.9 g/dL — ABNORMAL LOW (ref 3.5–5.0)
Alkaline Phosphatase: 68 U/L (ref 38–126)
Anion gap: 9 (ref 5–15)
BUN: 19 mg/dL (ref 6–20)
CHLORIDE: 91 mmol/L — AB (ref 101–111)
CO2: 35 mmol/L — ABNORMAL HIGH (ref 22–32)
Calcium: 8.3 mg/dL — ABNORMAL LOW (ref 8.9–10.3)
Creatinine, Ser: 0.92 mg/dL (ref 0.44–1.00)
Glucose, Bld: 382 mg/dL — ABNORMAL HIGH (ref 65–99)
POTASSIUM: 4.8 mmol/L (ref 3.5–5.1)
Sodium: 135 mmol/L (ref 135–145)
Total Bilirubin: 0.9 mg/dL (ref 0.3–1.2)
Total Protein: 5.2 g/dL — ABNORMAL LOW (ref 6.5–8.1)

## 2017-05-25 LAB — MAGNESIUM: Magnesium: 1.6 mg/dL — ABNORMAL LOW (ref 1.7–2.4)

## 2017-05-25 MED ORDER — INSULIN GLARGINE 100 UNIT/ML ~~LOC~~ SOLN
8.0000 [IU] | Freq: Every day | SUBCUTANEOUS | Status: DC
  Administered 2017-05-25 – 2017-05-27 (×3): 8 [IU] via SUBCUTANEOUS
  Filled 2017-05-25 (×3): qty 0.08

## 2017-05-25 MED ORDER — IPRATROPIUM-ALBUTEROL 0.5-2.5 (3) MG/3ML IN SOLN
3.0000 mL | Freq: Four times a day (QID) | RESPIRATORY_TRACT | Status: DC
  Administered 2017-05-25: 3 mL via RESPIRATORY_TRACT
  Filled 2017-05-25: qty 3

## 2017-05-25 MED ORDER — MAGNESIUM SULFATE 50 % IJ SOLN
3.0000 g | Freq: Once | INTRAVENOUS | Status: AC
  Administered 2017-05-25: 3 g via INTRAVENOUS
  Filled 2017-05-25: qty 6

## 2017-05-25 MED ORDER — IPRATROPIUM-ALBUTEROL 0.5-2.5 (3) MG/3ML IN SOLN
3.0000 mL | Freq: Three times a day (TID) | RESPIRATORY_TRACT | Status: DC
  Administered 2017-05-25 – 2017-05-28 (×9): 3 mL via RESPIRATORY_TRACT
  Filled 2017-05-25 (×9): qty 3

## 2017-05-25 NOTE — Progress Notes (Signed)
Patient refuses CPAP for tonight.  Advised patient to have RT called should she change her mind.

## 2017-05-25 NOTE — Evaluation (Signed)
Occupational Therapy Evaluation Patient Details Name: Annette Gibson. Nazario MRN: [id number] DOB: December 09, 1955 Today's Date: 05/25/2017    History of Present Illness Pt is a 61 y.o. female who presented to ED on 05/23/17 with increased SOB; suspected CHF and COPD exacerbation. Of note, had recently been admitted to OSH 12/11-18 with acute respiratory failure attributed to influenza A and acute CHF. Pertinent PMH includes HTN, DM2, COPD, osteopenia.   Clinical Impression   Pt was independent prior to her admission to [facility] earlier this month. Pt presents with mild standing balance deficits and decreased activity tolerance. Educated in importance of smoking cessation and in energy conservation strategies. Instructed in pursed lip breathing. Pt stating she did not trust oxygen tanks and blamed a faulty tank on the reason for her readmission. Pt without awareness of her disease process. Will follow acutely.    Follow Up Recommendations  No OT follow up    Equipment Recommendations  None recommended by OT    Recommendations for Other Services       Precautions / Restrictions Precautions Precautions: Fall Precaution Comments: High O2 needs Restrictions Weight Bearing Restrictions: No      Mobility Bed Mobility               General bed mobility comments: pt in chair  Transfers Overall transfer level: Needs assistance Equipment used: None Transfers: Sit to/from Stand Sit to Stand: Supervision         General transfer comment: for safety/lines    Balance Overall balance assessment: Needs assistance   Sitting balance-Leahy Scale: Good       Standing balance-Leahy Scale: Fair                             ADL either performed or assessed with clinical judgement   ADL Overall ADL's : Needs assistance/impaired Eating/Feeding: Independent;Sitting   Grooming: Wash/dry hands;Wash/dry face;Sitting;Set up   Upper Body Bathing: Set up;Sitting    Lower Body Bathing: Supervison/ safety;Sit to/from stand   Upper Body Dressing : Set up;Sitting   Lower Body Dressing: Supervision/safety;Sit to/from stand Lower Body Dressing Details (indicate cue type and reason): crosses foot over opposite knee Toilet Transfer: Supervision/safety;Ambulation;BSC   Toileting- Water quality scientist and Hygiene: Supervision/safety;Sit to/from stand       Functional mobility during ADLs: Supervision/safety General ADL Comments: Educated in energy conservation, reinforced with handout. Instructed in pursed lip breathing. Educated in benefits of smoking cessation     Vision Baseline Vision/History: Wears glasses Wears Glasses: At all times Patient Visual Report: No change from baseline       Perception     Praxis      Pertinent Vitals/Pain Pain Assessment: No/denies pain     Hand Dominance Right   Extremity/Trunk Assessment Upper Extremity Assessment Upper Extremity Assessment: Overall WFL for tasks assessed   Lower Extremity Assessment Lower Extremity Assessment: Defer to PT evaluation   Cervical / Trunk Assessment Cervical / Trunk Assessment: Normal   Communication Communication Communication: No difficulties   Cognition Arousal/Alertness: Awake/alert Behavior During Therapy: WFL for tasks assessed/performed Overall Cognitive Status: Within Functional Limits for tasks assessed                                     General Comments       Exercises     Shoulder Instructions      Home Living  Family/patient expects to be discharged to:: Private residence Living Arrangements: Children Available Help at Discharge: Family;Available PRN/intermittently Type of Home: House Home Access: Stairs to enter [employer] of Steps: 3 Entrance Stairs-Rails: Right;Left Home Layout: One level     Bathroom Shower/Tub: Tub/shower unit;Walk-in Psychologist, prison and probation services: Standard     Home Equipment: Therapist, nutritional Comments: Daughter works near home, can be available for 24/7 support if needed      Prior Functioning/Environment Level of Independence: Independent        Comments: Does not work, drives. Reports primarily sedentary lifestyle; enjoys playing games on phone        OT Problem List: Decreased activity tolerance      OT Treatment/Interventions: Energy conservation    OT Goals(Current goals can be found in the care plan section) Acute Rehab OT Goals Patient Stated Goal: Return home OT Goal Formulation: With patient Time For Goal Achievement: 06/01/17 Potential to Achieve Goals: Good  OT Frequency: Min 2X/week   Barriers to D/C:            Co-evaluation              AM-PAC PT "6 Clicks" Daily Activity     Outcome Measure Help from another person eating meals?: None Help from another person taking care of personal grooming?: A Little Help from another person toileting, which includes using toliet, bedpan, or urinal?: A Little Help from another person bathing (including washing, rinsing, drying)?: A Little Help from another person to put on and taking off regular upper body clothing?: None Help from another person to put on and taking off regular lower body clothing?: A Little 6 Click Score: 20   End of Session Equipment Utilized During Treatment: Oxygen  Activity Tolerance: Patient limited by fatigue Patient left: in chair;with call bell/phone within reach(RT)  OT Visit Diagnosis: Unsteadiness on feet (R26.81);Other (comment)(decreased activity tolerance)                Time: 8069-9967 OT Time Calculation (min): 25 min Charges:  OT General Charges $OT Visit: 1 Visit OT Evaluation $OT Eval Moderate Complexity: 1 Mod OT Treatments $Self Care/Home Management : 8-22 mins G-Codes:     Malka So 05/25/2017, 11:41 AM  05/25/2017 Nestor Lewandowsky, OTR/L Pager: [phone]

## 2017-05-25 NOTE — Progress Notes (Signed)
Patient HR is running in the upper 40s to 50s and she is asymptomatic. Text Page sent to Dr Silas [place]. Will continue to monitor.

## 2017-05-25 NOTE — Progress Notes (Signed)
PROGRESS NOTE    Annette Gibson  NIO:[id number] DOB: 1956/05/14 DOA: 05/23/2017 PCP: Chevis Pretty, FNP   Brief Narrative:  61 y.o. WF PMHx Anxiety, Chronic Diastolic CHF?, COPD, DM Type 2 uncontrolled with complication, and HTN, Obesity,    Presented to the ED w/ SOB, swelling, and diarrhea.  She was admitted to an outside hospital from 05/13/2017 until 05/20/2017 with acute respiratory failure attributed to influenza A and acute CHF.  She reports that "liver levels" were 7000 at the time, improved to 2000 by time of discharge, and viral hepatitis testing was negative.  She also had melena with stable H&H.  Since time of discharge, patient had been short of breath with minimal exertion and had been experiencing numerous episodes of diarrhea daily.  She was saturating in the 70s when she saw her PCP and directed to the ED for further evaluation.     Upon arrival to the ED, patient is found to be afebrile, saturating 76% on room air, slightly tachypneic, and modestly hypertensive.  EKG featured a sinus rhythm with right axis deviation and chest x-ray was notable for cardiomegaly, pulmonary vascular congestion, and diffuse interstitial pulmonary edema with small bilateral effusions.  She required use of BIPAP to improve her oxygenation.      Subjective: 12/23 A/O 4 negative CP, positive chronic SOB was discharged recently from [facility] on home O2 3 L. Also states that she had a sleep study and failed the study, was not discharged on CPAP secondary to no insurance. States stopped smoking on 05/13/2017   Assessment & Plan:   Principal Problem:   CHF, acute on chronic (HCC) Active Problems:   Hypertension   COPD with acute exacerbation (HCC)   GAD (generalized anxiety disorder)   Diabetes ([place])   Acute on chronic respiratory failure with hypoxia and hypercapnia (HCC)   Diarrhea   Dark stools   Hypokalemia   Acute  on chronic Respiratory failure with Hypoxia    -Patient on 3 L O2 via [place] at home: Currently on 6 L O2 to maintain SPO2 89 -93%  -Titrate O2 to maintain SPO2 89-93%  Acute exacerbation of Chronic CHF  -Echocardiogram not consistent with CHF: See results below   COPD with acute exacerbation  -Flutter valve -DuoNeb QID   OSA (per patient diagnosed at [facility] on recent hospitalization)  -Not on CPAP secondary to no insurance. -CPAP per respiratory   Diarrhea - melena  -though I agree that the patient is at risk for C. difficile she has not had a bowel movement since her admission and currently appears to be constipated - I therefore feel that a colitis is unlikely - discontinue contact precautions and follow clinically - pt is not anemic   Hypokalemia  -Potassium goal > 4 -Resolved   Hypomagnesemia -Magnesium goal> 2 -Magnesium IV 3 g     Hypertension  BP variable - follow w/o change for today    Diabetes type 2 uncontrolled with complications -Hemoglobin A1c pending  -Lipid panel pending   -Lantus 8 units daily -Moderate SSI   Elevated LFT's  Pt reported "liver levels were 7000" on recent admission, improved to 2000 by discharge, and viral hepatitis testing negative - LFTs not significantly elevated at this time   Tobacco abuse -Recently discontinued smoking 05/13/2017   DVT prophylaxis: SCD Code Status: Full Family Communication: None Disposition Plan: TBD   Consultants:    Procedures/Significant Events:  12/21 DG GHW:[id number] with vascular congestion and diffuse interstitial pulmonary edema  and small bilateral pleural effusion    I have personally reviewed and interpreted all radiology studies and my findings are as above.  VENTILATOR SETTINGS:    Cultures 12/22 MRSA by PCR negative 12/22 respiratory virus panel negative 12/22 HIV negative   Antimicrobials: Anti-infectives (From admission, onward)   None       Devices    LINES / TUBES:      Continuous  Infusions:   Objective: Vitals:   05/24/17 2012 05/25/17 0003 05/25/17 0344 05/25/17 0550  BP:  127/63 136/69   Pulse: 68 63 (!) 57   Resp: 18 (!) 23 17   Temp:  97.8 F (36.6 C) 98 F (36.7 C)   TempSrc:  Oral Oral   SpO2: 94% 94% 98%   Weight:    206 lb 12.7 oz (93.8 kg)  Height:        Intake/Output Summary (Last 24 hours) at 05/25/2017 0745 Last data filed at 05/25/2017 0550 Gross per 24 hour  Intake 980 ml  Output 2301 ml  Net -1321 ml   Filed Weights   05/23/17 2100 05/24/17 0500 05/25/17 0550  Weight: 213 lb 3 oz (96.7 kg) 206 lb 12.7 oz (93.8 kg) 206 lb 12.7 oz (93.8 kg)    Examination:  General: A/O 4, acute on chronic respiratory distress Neck:  Negative scars, masses, torticollis, lymphadenopathy, JVD Lungs: diffuse poor air movement, diffuse expiratory wheeze, negative crackles  Cardiovascular: Regular rate and rhythm without murmur gallop or rub normal S1 and S2 Abdomen: Obese, negative abdominal pain, nondistended, positive soft, bowel sounds, no rebound, no ascites, no appreciable mass Extremities: No significant cyanosis, clubbing, or edema bilateral lower extremities Skin: Negative rashes, lesions, ulcers Psychiatric:  Negative depression, negative anxiety, negative fatigue, negative mania  Central nervous system:  Cranial nerves II through XII intact, tongue/uvula midline, all extremities muscle strength 5/5, sensation intact throughout, negative dysarthria, negative expressive aphasia, negative receptive aphasia.  .     Data Reviewed: Care during the described time interval was provided by me .  I have reviewed this patient's available data, including medical history, events of note, physical examination, and all test results as part of my evaluation.   CBC: Recent Labs  Lab 05/23/17 1654 05/24/17 0318 05/25/17 0241  WBC 11.6* 10.4 24.5*  HGB 14.2 13.5 12.9  HCT 45.0 43.3 40.9  MCV 97.0 96.7 96.2  PLT 228 247 790   Basic Metabolic  Panel: Recent Labs  Lab 05/23/17 1654 05/24/17 0318 05/25/17 0241  NA 141 141 135  K 3.1* 2.8* 4.8  CL 93* 92* 91*  CO2 41* 38* 35*  GLUCOSE 205* 285* 382*  BUN _0 CREATININE 0.89 1.14* 0.92  CALCIUM 8.9 8.7* 8.3*  MG  --  1.4*  --    GFR: Estimated Creatinine Clearance: 71.3 mL/min (by C-G formula based on SCr of 0.92 mg/dL). Liver Function Tests: Recent Labs  Lab 05/23/17 2046 05/24/17 0318 05/25/17 0241  AST 29 42* 29  ALT 123* 114* 83*  ALKPHOS 66 64 68  BILITOT 1.4* 0.8 0.9  PROT 6.0* 5.8* 5.2*  ALBUMIN 3.2* 3.0* 2.9*   No results for input(s): LIPASE, AMYLASE in the last 168 hours. No results for input(s): AMMONIA in the last 168 hours. Coagulation Profile: No results for input(s): INR, PROTIME in the last 168 hours. Cardiac Enzymes: Recent Labs  Lab 05/23/17 2046  TROPONINI 0.05*   BNP (last 3 results) No results for input(s): PROBNP in the last 8760  hours. HbA1C: No results for input(s): HGBA1C in the last 72 hours. CBG: Recent Labs  Lab 05/24/17 0320 05/24/17 0737 05/24/17 1122 05/24/17 1629 05/24/17 2120  GLUCAP 282* 156* 280* 268* 279*   Lipid Profile: No results for input(s): CHOL, HDL, LDLCALC, TRIG, CHOLHDL, LDLDIRECT in the last 72 hours. Thyroid Function Tests: No results for input(s): TSH, T4TOTAL, FREET4, T3FREE, THYROIDAB in the last 72 hours. Anemia Panel: No results for input(s): VITAMINB12, FOLATE, FERRITIN, TIBC, IRON, RETICCTPCT in the last 72 hours. Urine analysis:    Component Value Date/Time   BILIRUBINUR neg 07/25/2014 0838   PROTEINUR neg 07/25/2014 0838   UROBILINOGEN negative 07/25/2014 0838   NITRITE pos 07/25/2014 0838   LEUKOCYTESUR Trace 07/25/2014 0838   Sepsis Labs: _0 (procalcitonin:4,lacticidven:4)  ) Recent Results (from the past 240 hour(s))  Respiratory Panel by PCR     Status: None   Collection Time: 05/24/17  4:36 AM  Result Value Ref Range Status   Adenovirus NOT DETECTED NOT  DETECTED Final   Coronavirus 229E NOT DETECTED NOT DETECTED Final   Coronavirus HKU1 NOT DETECTED NOT DETECTED Final   Coronavirus NL63 NOT DETECTED NOT DETECTED Final   Coronavirus OC43 NOT DETECTED NOT DETECTED Final   Metapneumovirus NOT DETECTED NOT DETECTED Final   Rhinovirus / Enterovirus NOT DETECTED NOT DETECTED Final   Influenza A NOT DETECTED NOT DETECTED Final   Influenza B NOT DETECTED NOT DETECTED Final   Parainfluenza Virus 1 NOT DETECTED NOT DETECTED Final   Parainfluenza Virus 2 NOT DETECTED NOT DETECTED Final   Parainfluenza Virus 3 NOT DETECTED NOT DETECTED Final   Parainfluenza Virus 4 NOT DETECTED NOT DETECTED Final   Respiratory Syncytial Virus NOT DETECTED NOT DETECTED Final   Bordetella pertussis NOT DETECTED NOT DETECTED Final   Chlamydophila pneumoniae NOT DETECTED NOT DETECTED Final   Mycoplasma pneumoniae NOT DETECTED NOT DETECTED Final  MRSA PCR Screening     Status: None   Collection Time: 05/24/17  4:36 AM  Result Value Ref Range Status   MRSA by PCR NEGATIVE NEGATIVE Final    Comment:        The GeneXpert MRSA Assay (FDA approved for NASAL specimens only), is one component of a comprehensive MRSA colonization surveillance program. It is not intended to diagnose MRSA infection nor to guide or monitor treatment for MRSA infections.          Radiology Studies: Dg Chest 2 View  Result Date: 05/23/2017 CLINICAL DATA:  Shortness of breath EXAM: CHEST  2 VIEW COMPARISON:  05/14/2017 FINDINGS: Small bilateral pleural effusions. Borderline to mild cardiomegaly with vascular congestion and diffuse interstitial opacity consistent with pulmonary edema. No focal consolidation. No pneumothorax. IMPRESSION: Cardiomegaly with vascular congestion and diffuse interstitial pulmonary edema and small bilateral pleural effusion. Electronically Signed   By: Donavan Foil M.D.   On: 05/23/2017 17:59        Scheduled Meds: . atorvastatin  40 mg Oral q1800  .  budesonide (PULMICORT) nebulizer solution  0.25 mg Nebulization BID  . carvedilol  3.125 mg Oral BID WC  . furosemide  40 mg Intravenous Q12H  . insulin aspart  0-15 Units Subcutaneous TID WC  . insulin aspart  0-5 Units Subcutaneous QHS  . methylPREDNISolone (SOLU-MEDROL) injection  40 mg Intravenous Q12H  . potassium chloride  20 mEq Oral TID   Continuous Infusions:   LOS: 2 days    Time spent: 40 minutes    Ethan Clayburn, Geraldo Docker, MD [facility] Pager [phone]  If 7PM-7AM, please contact night-coverage [email or web address] Password [facility] 05/25/2017, 7:45 AM

## 2017-05-25 NOTE — Care Management Note (Addendum)
Case Management Note  Patient Details  Name: Annette Gibson. Such MRN: [id number] Date of Birth: [birth year]  Subjective/Objective:      Pt admitted for respiratory failure 1 week after d/c from [place] had been set up with DME O2 (3lpm) at d/c.  Pt with multiple complaints during the week at home including SOB, HA, diarrhea. Pt had gone to PMD f/u appt and was hypoxic on RA.  Pt states that she did not have her O2 tank with her because "something is wrong with it".  Pt found to be in pulmonary edema upon arrival to ED.  Pt states she has been unemployed since 01/30/17 and is receiving unemployment as her only income.  She states she was driving until hospitalization on 12/11 and does not feel she will be able to drive after d/c.  Pt lives with daughter, who works at [employer] and helps her obtain medications and will drive her to MD appointments.  Pt states daughter gets her prescriptions as pt saves money for them from [facility] on [address] in [place].  She says she has enough medication for approximately 2 weeks right now.  Pt states she owns home and does not qualify for Medicaid and wants to apply for disability.  Pt has PMD at [facility], [place] FNP, and has always paid out of pocket for appointments there.    Pt takes Trulicity injections, which cost >$400/month.  She also uses Brio, which she cannot afford.  She has been able to obtain samples from PMD for these medications that she is currently using.  Pt does not have pulmonologist and has no other DME at home.         Action/Plan: Discussed local clinics and [place] options with pt.  Pt does not want to change PMD.    Pt open to [facility] if recommended and would like to use [facility], as she has used them before.  Will refer to Financial Counseling to f/u regarding financial aid applications.   CM will continue to follow for CHF education and medication assistance when contacts can be made M-F.     Expected Discharge Date:                  Expected Discharge Plan:  [place]  In-House Referral:  Development worker, community, PCP / [facility]  Discharge planning Services  CM Consult, Medication Assistance  Post Acute Care Choice:    Choice offered to:     DME Arranged:    DME Agency:     HH Arranged:    HH Agency:     Status of Service:  In process, will continue to follow  If discussed at Long Length of Stay Meetings, dates discussed:    Additional Comments:  Arley Phenix, RN 05/25/2017, 10:05 AM

## 2017-05-26 LAB — GLUCOSE, CAPILLARY
GLUCOSE-CAPILLARY: 277 mg/dL — AB (ref 65–99)
Glucose-Capillary: 329 mg/dL — ABNORMAL HIGH (ref 65–99)
Glucose-Capillary: 261 mg/dL — ABNORMAL HIGH (ref 65–99)
Glucose-Capillary: 342 mg/dL — ABNORMAL HIGH (ref 65–99)

## 2017-05-26 LAB — CBC
HEMATOCRIT: 40.1 % (ref 36.0–46.0)
Hemoglobin: 13 g/dL (ref 12.0–15.0)
MCH: 30.9 pg (ref 26.0–34.0)
MCHC: 32.4 g/dL (ref 30.0–36.0)
MCV: 95.2 fL (ref 78.0–100.0)
Platelets: 256 10*3/uL (ref 150–400)
RBC: 4.21 MIL/uL (ref 3.87–5.11)
RDW: 13.7 % (ref 11.5–15.5)
WBC: 21.8 10*3/uL — ABNORMAL HIGH (ref 4.0–10.5)

## 2017-05-26 LAB — BASIC METABOLIC PANEL
Anion gap: 8 (ref 5–15)
BUN: 27 mg/dL — ABNORMAL HIGH (ref 6–20)
CALCIUM: 8.8 mg/dL — AB (ref 8.9–10.3)
CO2: 36 mmol/L — AB (ref 22–32)
CREATININE: 1 mg/dL (ref 0.44–1.00)
Chloride: 91 mmol/L — ABNORMAL LOW (ref 101–111)
GFR calc Af Amer: >60 mL/min (ref >60)
GFR calc non Af Amer: 60 mL/min — ABNORMAL LOW (ref >60)
GLUCOSE: 335 mg/dL — AB (ref 65–99)
Potassium: 4.6 mmol/L (ref 3.5–5.1)
Sodium: 135 mmol/L (ref 135–145)

## 2017-05-26 LAB — LIPID PANEL
CHOL/HDL RATIO: 2.4 ratio
CHOLESTEROL: 129 mg/dL (ref 0–200)
HDL: 53 mg/dL (ref >40)
LDL Cholesterol: 56 mg/dL (ref 0–99)
TRIGLYCERIDES: 98 mg/dL (ref <150)
VLDL: 20 mg/dL (ref 0–40)

## 2017-05-26 LAB — MAGNESIUM: Magnesium: 1.8 mg/dL (ref 1.7–2.4)

## 2017-05-26 NOTE — Progress Notes (Signed)
Inpatient Diabetes Program Recommendations  AACE/ADA: New Consensus Statement on Inpatient Glycemic Control (2015)  Target Ranges:  Prepandial:   less than 140 mg/dL      Peak postprandial:   less than 180 mg/dL (1-2 hours)      Critically ill patients:  140 - 180 mg/dL   Results for Annette Gibson, Annette L. (MRN [id number]) as of 05/26/2017 08:54  Ref. Range 05/25/2017 08:11 05/25/2017 11:51 05/25/2017 16:46 05/25/2017 21:07 05/26/2017 08:04  Glucose-Capillary Latest Ref Range: 65 - 99 mg/dL 348 (H) 310 (H) 294 (H) 278 (H) 329 (H)   Review of Glycemic Control  Diabetes history: DM2 Outpatient Diabetes medications: Metformin 1000 mg BID, Amaryl 4 mg daily, Trulicity 1.5 mg weekly Current orders for Inpatient glycemic control: Lantus 8 units daily, Novolog 0-15 units TID with meals, Novolog 0-5 units QHS; Solumedrol 40 mg Q12H  Inpatient Diabetes Program Recommendations:  Insulin - Basal: If steroids are continued as ordered, please consider increasing Lantus to 20 units daily. Insulin - Meal Coverage: Please consider ordering Novolog 5 units TID with meals for meal coverage if patient eats at least 50% of meals.  Thanks, Barnie Alderman, RN, MSN, CDE Diabetes Coordinator Inpatient Diabetes Program [phone] (Team Pager from 8am to 5pm)

## 2017-05-26 NOTE — Progress Notes (Signed)
Pt has refused cpap at this time.  RT informed pt to call if she changes her mind.  RT will continue to monitor.

## 2017-05-26 NOTE — Plan of Care (Signed)
  Progressing Education: Knowledge of General Education information will improve 05/26/2017 0039 - Progressing by Alonna Buckler, RN Health Behavior/Discharge Planning: Ability to manage health-related needs will improve 05/26/2017 0039 - Progressing by Alonna Buckler, RN Clinical Measurements: Ability to maintain clinical measurements within normal limits will improve 05/26/2017 0039 - Progressing by Alonna Buckler, RN Will remain free from infection 05/26/2017 0039 - Progressing by Alonna Buckler, RN Diagnostic test results will improve 05/26/2017 0039 - Progressing by Alonna Buckler, RN Respiratory complications will improve 05/26/2017 0039 - Progressing by Alonna Buckler, RN Cardiovascular complication will be avoided 05/26/2017 0039 - Progressing by Alonna Buckler, RN Activity: Risk for activity intolerance will decrease 05/26/2017 0039 - Progressing by Alonna Buckler, RN Nutrition: Adequate nutrition will be maintained 05/26/2017 0039 - Progressing by Alonna Buckler, RN Coping: Level of anxiety will decrease 05/26/2017 0039 - Progressing by Alonna Buckler, RN Elimination: Will not experience complications related to bowel motility 05/26/2017 0039 - Progressing by Alonna Buckler, RN Will not experience complications related to urinary retention 05/26/2017 0039 - Progressing by Alonna Buckler, RN Pain Managment: General experience of comfort will improve 05/26/2017 0039 - Progressing by Alonna Buckler, RN Safety: Ability to remain free from injury will improve 05/26/2017 0039 - Progressing by Alonna Buckler, RN Skin Integrity: Risk for impaired skin integrity will decrease 05/26/2017 0039 - Progressing by Alonna Buckler, RN Education: Ability to demonstrate management of disease process will improve 05/26/2017 0039 - Progressing by Alonna Buckler, RN Ability to verbalize understanding of medication therapies will  improve 05/26/2017 0039 - Progressing by Alonna Buckler, RN Activity: Capacity to carry out activities will improve 05/26/2017 0039 - Progressing by Alonna Buckler, RN Cardiac: Ability to achieve and maintain adequate cardiopulmonary perfusion will improve 05/26/2017 0039 - Progressing by Alonna Buckler, RN Education: Ability to describe self-care measures that may prevent or decrease complications (Diabetes Survival Skills Education) will improve 05/26/2017 0039 - Progressing by Alonna Buckler, RN Coping: Ability to adjust to condition or change in health will improve 05/26/2017 0039 - Progressing by Alonna Buckler, RN Fluid Volume: Ability to maintain a balanced intake and output will improve 05/26/2017 0039 - Progressing by Alonna Buckler, RN Health Behavior/Discharge Planning: Ability to identify and utilize available resources and services will improve 05/26/2017 0039 - Progressing by Alonna Buckler, RN Ability to manage health-related needs will improve 05/26/2017 0039 - Progressing by Alonna Buckler, RN Metabolic: Ability to maintain appropriate glucose levels will improve 05/26/2017 0039 - Progressing by Alonna Buckler, RN Nutritional: Maintenance of adequate nutrition will improve 05/26/2017 0039 - Progressing by Alonna Buckler, RN Progress toward achieving an optimal weight will improve 05/26/2017 0039 - Progressing by Alonna Buckler, RN Skin Integrity: Risk for impaired skin integrity will decrease 05/26/2017 0039 - Progressing by Alonna Buckler, RN Tissue Perfusion: Adequacy of tissue perfusion will improve 05/26/2017 0039 - Progressing by Alonna Buckler, RN

## 2017-05-26 NOTE — Progress Notes (Signed)
Patient is in the room. Alert x4. O2 on patient. Day RN has given this RN report.

## 2017-05-26 NOTE — Progress Notes (Signed)
Report given to 3E; NT will transfer pt; will continue to monitor.   [place]  Javeah Loeza, RN

## 2017-05-26 NOTE — Progress Notes (Signed)
PROGRESS NOTE    Annette Gibson  RFF:[id number] DOB: 1955/06/16 DOA: 05/23/2017 PCP: Chevis Pretty, FNP   Brief Narrative:  Pt is a 61 y.o. female past medical history relevant for chronic diastolic CHF, anxiety disorder, diabetes mellitus, hypertension, obesity with suspected obstructive sleep apnea and COPD who presented to ED on 05/23/17 with increased SOB; suspected CHF and COPD exacerbation. Of note, had recently been admitted to OSH 05/13/17 thru 05/13/17 at [place] with acute on chronic respiratory failure attributed to influenza A and acute CHF.  She was apparently discharged without BiPAP/CPAP due to lack of insurance coverage.    She reports that "liver levels" were 7000 at the time, improved to 2000 by time of discharge, and viral hepatitis testing was negative.  She also had melena with stable H&H.Marland Kitchen     Upon arrival to the ED, patient is found to be afebrile, saturating 76% on room air, slightly tachypneic, and modestly hypertensive.  EKG featured a sinus rhythm with right axis deviation and chest x-ray was notable for cardiomegaly, pulmonary vascular congestion, and diffuse interstitial pulmonary edema with small bilateral effusions.  She required use of BIPAP to improve her oxygenation.      Subjective: 12/23 A/O 4 negative CP, positive chronic SOB was discharged recently from [facility] on home O2 3 L. Also states that she had a sleep study and failed the study,  not on CPAP secondary to no insurance. States stopped smoking on 05/13/2017   Assessment & Plan:   Principal Problem:   CHF, acute on chronic (HCC) Active Problems:   Hypertension   COPD with acute exacerbation (HCC)   GAD (generalized anxiety disorder)   Diabetes ([place])   Acute on chronic respiratory failure with hypoxia and hypercapnia (HCC)   Diarrhea   Dark stools   Hypokalemia   1)Acute  on chronic Respiratory failure with Hypoxia - Patient on 3 L O2 via [place] at home: Currently  on 6 L O2 to maintain SPO2 89 -93% , Titrate O2 to maintain SPO2 89-93%, hypoxic due to CHF and underlying COPD , suspected steroid-induced leukocytosis  2)HFpEF-patient with chronic diastolic CHF , echo with EF of 55-60% ,  overall clinically  improved  at this time .    Continue IV Lasix 40 mg bid, daily weight and fluid output and input monitoring  3)COPD with acute exacerbation -improving, continue Solu-Medrol 40 mg every 12 hours, continue bronchodilators, Flutter valve   4)OSA (per patient diagnosed at [facility] on recent hospitalization) -Not on CPAP secondary to no insurance, -CPAP per respiratory   5)Diarrhea - ?? melena  -   ?? Increased C. difficile risk , however she has not had a bowel movement since her admission and currently appears to be constipated - colitis is unlikely - discontinue contact precautions and follow clinically - pt is not anemic  6)FEN/hypokalemia/hypomagnesemia-watch electrolytes closely with IV diuresis   7)Hypertension -continue Coreg 3.125 mg twice daily,  8)Diabetes type 2 uncontrolled with complications- Hemoglobin A1c 7.9 continue Lantus insulin 18 units daily, sliding scale insulin as ordered, glycemic control should improve with tapering of steroids  9)Elevated LFT's - Pt reported "liver levels were 7000" on recent admission, improved to 2000 by discharge, and viral hepatitis testing negative - LFTs not significantly elevated at this time   10)Tobacco abuse- -Recently discontinued smoking 05/13/2017, congratulated on quitting smoking   DVT prophylaxis: SCD Code Status: Full Family Communication: None Disposition Plan: Possible discharge home in 1-2 days, ??? CPAP  Consultants:    Procedures/Significant Events:  12/21 DG QBH:[id number] with vascular congestion and diffuse interstitial pulmonary edema and small bilateral pleural effusion   Cultures 12/22 MRSA by PCR negative 12/22 respiratory virus panel negative 12/22 HIV  negative   Antimicrobials: Anti-infectives (From admission, onward)   None      Objective: Vitals:   05/26/17 0841 05/26/17 1000 05/26/17 1235 05/26/17 1436  BP:  (!) 144/65 (!) 154/81   Pulse:  66 61   Resp:  (!) 21 18   Temp:   98 F (36.7 C)   TempSrc:   Axillary   SpO2: 95% 94% 100% 100%  Weight:      Height:        Intake/Output Summary (Last 24 hours) at 05/26/2017 1607 Last data filed at 05/26/2017 1330 Gross per 24 hour  Intake 720 ml  Output 3160 ml  Net -2440 ml   Filed Weights   05/24/17 0500 05/25/17 0550 05/26/17 0303  Weight: 93.8 kg (206 lb 12.7 oz) 93.8 kg (206 lb 12.7 oz) 93.5 kg (206 lb 2.1 oz)    Examination:  General: A/O 4, able to speak in sentences HEENT:- [place].AT, No sclera icterus, [place] 6 L/min Neck-Supple Neck,No JVD,.  Lungs-few scattered wheezes bilaterally CV- S1, S2 normal Abd-  +ve B.Sounds, Abd Soft, No tenderness,   increased truncal adiposity Extremity/Skin:- No  edema,    Neuropsych-appropriate affect, no new focal deficits.   CBC: Recent Labs  Lab 05/23/17 1654 05/24/17 0318 05/25/17 0241 05/26/17 0222  WBC 11.6* 10.4 24.5* 21.8*  HGB 14.2 13.5 12.9 13.0  HCT 45.0 43.3 40.9 40.1  MCV 97.0 96.7 96.2 95.2  PLT 228 247 270 353   Basic Metabolic Panel: Recent Labs  Lab 05/23/17 1654 05/24/17 0318 05/25/17 0241 05/25/17 0805 05/26/17 0222  NA 141 141 135  --  135  K 3.1* 2.8* 4.8  --  4.6  CL 93* 92* 91*  --  91*  CO2 41* 38* 35*  --  36*  GLUCOSE 205* 285* 382*  --  335*  BUN _0 --  27*  CREATININE 0.89 1.14* 0.92  --  1.00  CALCIUM 8.9 8.7* 8.3*  --  8.8*  MG  --  1.4*  --  1.6* 1.8   GFR: Estimated Creatinine Clearance: 65.5 mL/min (by C-G formula based on SCr of 1 mg/dL). Liver Function Tests: Recent Labs  Lab 05/23/17 2046 05/24/17 0318 05/25/17 0241  AST 29 42* 29  ALT 123* 114* 83*  ALKPHOS 66 64 68  BILITOT 1.4* 0.8 0.9  PROT 6.0* 5.8* 5.2*  ALBUMIN 3.2* 3.0* 2.9*   No results for  input(s): LIPASE, AMYLASE in the last 168 hours. No results for input(s): AMMONIA in the last 168 hours. Coagulation Profile: No results for input(s): INR, PROTIME in the last 168 hours. Cardiac Enzymes: Recent Labs  Lab 05/23/17 2046  TROPONINI 0.05*   BNP (last 3 results) No results for input(s): PROBNP in the last 8760 hours. HbA1C: Recent Labs    05/25/17 0241  HGBA1C 7.9*   CBG: Recent Labs  Lab 05/25/17 1151 05/25/17 1646 05/25/17 2107 05/26/17 0804 05/26/17 1247  GLUCAP 310* 294* 278* 329* 261*   Lipid Profile: Recent Labs    05/26/17 0222  CHOL 129  HDL 53  LDLCALC 56  TRIG 98  CHOLHDL 2.4   Thyroid Function Tests: No results for input(s): TSH, T4TOTAL, FREET4, T3FREE, THYROIDAB in the last 72 hours. Anemia Panel: No results  for input(s): VITAMINB12, FOLATE, FERRITIN, TIBC, IRON, RETICCTPCT in the last 72 hours. Urine analysis:    Component Value Date/Time   BILIRUBINUR neg 07/25/2014 0838   PROTEINUR neg 07/25/2014 0838   UROBILINOGEN negative 07/25/2014 0838   NITRITE pos 07/25/2014 0838   LEUKOCYTESUR Trace 07/25/2014 0838   Sepsis Labs: _0 (procalcitonin:4,lacticidven:4)  ) Recent Results (from the past 240 hour(s))  Respiratory Panel by PCR     Status: None   Collection Time: 05/24/17  4:36 AM  Result Value Ref Range Status   Adenovirus NOT DETECTED NOT DETECTED Final   Coronavirus 229E NOT DETECTED NOT DETECTED Final   Coronavirus HKU1 NOT DETECTED NOT DETECTED Final   Coronavirus NL63 NOT DETECTED NOT DETECTED Final   Coronavirus OC43 NOT DETECTED NOT DETECTED Final   Metapneumovirus NOT DETECTED NOT DETECTED Final   Rhinovirus / Enterovirus NOT DETECTED NOT DETECTED Final   Influenza A NOT DETECTED NOT DETECTED Final   Influenza B NOT DETECTED NOT DETECTED Final   Parainfluenza Virus 1 NOT DETECTED NOT DETECTED Final   Parainfluenza Virus 2 NOT DETECTED NOT DETECTED Final   Parainfluenza Virus 3 NOT DETECTED NOT DETECTED  Final   Parainfluenza Virus 4 NOT DETECTED NOT DETECTED Final   Respiratory Syncytial Virus NOT DETECTED NOT DETECTED Final   Bordetella pertussis NOT DETECTED NOT DETECTED Final   Chlamydophila pneumoniae NOT DETECTED NOT DETECTED Final   Mycoplasma pneumoniae NOT DETECTED NOT DETECTED Final  MRSA PCR Screening     Status: None   Collection Time: 05/24/17  4:36 AM  Result Value Ref Range Status   MRSA by PCR NEGATIVE NEGATIVE Final    Comment:        The GeneXpert MRSA Assay (FDA approved for NASAL specimens only), is one component of a comprehensive MRSA colonization surveillance program. It is not intended to diagnose MRSA infection nor to guide or monitor treatment for MRSA infections.          Radiology Studies: No results found.      Scheduled Meds: . atorvastatin  40 mg Oral q1800  . budesonide (PULMICORT) nebulizer solution  0.25 mg Nebulization BID  . carvedilol  3.125 mg Oral BID WC  . furosemide  40 mg Intravenous Q12H  . insulin aspart  0-15 Units Subcutaneous TID WC  . insulin aspart  0-5 Units Subcutaneous QHS  . insulin glargine  8 Units Subcutaneous Daily  . ipratropium-albuterol  3 mL Nebulization TID  . methylPREDNISolone (SOLU-MEDROL) injection  40 mg Intravenous Q12H     Roxan Hockey, MD [facility] Pager [phone]   If 7PM-7AM, please contact night-coverage [email or web address] Password [email or web address] 05/26/2017, 4:07 PM

## 2017-05-26 NOTE — Progress Notes (Signed)
Physical Therapy Treatment Patient Details Name: Annette Gibson. Leib MRN: [id number] DOB: 1955/12/27 Today's Date: 05/26/2017    History of Present Illness Pt is a 61 y.o. female who presented to ED on 05/23/17 with increased SOB; suspected CHF and COPD exacerbation. Of note, had recently been admitted to OSH 12/11-18 with acute respiratory failure attributed to influenza A and acute CHF. Pertinent PMH includes HTN, DM2, COPD, osteopenia.   PT Comments    Pt has progressed well with mobility. Indep for transfers and ambulation, requiring no assistive device. SpO2 down to 84% on 6L O2 [place] when walking, returning to >88% with standing rest break and pursed lip breathing. Educ on energy conservation and importance of continued mobility. Pt has met short-term acute PT goals (not interested in stair training). All education completed and questions answered. From a mobility perspective, feel pt is safe to return home with supervision from her daughter. Will d/c acute PT.    Follow Up Recommendations  No PT follow up;Supervision for mobility/OOB     Equipment Recommendations  None recommended by PT    Recommendations for Other Services       Precautions / Restrictions Precautions Precautions: Fall Precaution Comments: High O2 needs Restrictions Weight Bearing Restrictions: No    Mobility  Bed Mobility Overal bed mobility: Independent                Transfers Overall transfer level: Independent Equipment used: None Transfers: Sit to/from Stand              Ambulation/Gait Ambulation/Gait assistance: Independent Ambulation Distance (Feet): 250 Feet Assistive device: None Gait Pattern/deviations: Step-through pattern;Decreased stride length Gait velocity: Decreased   General Gait Details: Indep with amb, requiring 2x standing rest breaks secondary to increased WOB. SpO2 down to 84% on 6L O2 [place], returning to >88% with standing rest break and pursed lip  breathing   Stairs Stairs: (Pt declining stair training, reports she feels comfortable climbing 3 steps into home with rails)          Wheelchair Mobility    Modified Rankin (Stroke Patients Only)       Balance Overall balance assessment: Needs assistance   Sitting balance-Leahy Scale: Good       Standing balance-Leahy Scale: Good                              Cognition Arousal/Alertness: Awake/alert Behavior During Therapy: WFL for tasks assessed/performed Overall Cognitive Status: Within Functional Limits for tasks assessed                                        Exercises      General Comments        Pertinent Vitals/Pain Pain Assessment: No/denies pain    Home Living                      Prior Function            PT Goals (current goals can now be found in the care plan section) Acute Rehab PT Goals Patient Stated Goal: Return home PT Goal Formulation: With patient Time For Goal Achievement: 06/07/17 Potential to Achieve Goals: Good Progress towards PT goals: Goals met/education completed, patient discharged from PT    Frequency    Min 3X/week      PT Plan Current  plan remains appropriate    Co-evaluation              AM-PAC PT "6 Clicks" Daily Activity  Outcome Measure  Difficulty turning over in bed (including adjusting bedclothes, sheets and blankets)?: None Difficulty moving from lying on back to sitting on the side of the bed? : None Difficulty sitting down on and standing up from a chair with arms (e.g., wheelchair, bedside commode, etc,.)?: None Help needed moving to and from a bed to chair (including a wheelchair)?: None Help needed walking in hospital room?: None Help needed climbing 3-5 steps with a railing? : A Little 6 Click Score: 23    End of Session Equipment Utilized During Treatment: Gait belt;Oxygen Activity Tolerance: Patient tolerated treatment well Patient left: in  chair;with call bell/phone within reach Nurse Communication: Mobility status PT Visit Diagnosis: Other abnormalities of gait and mobility (R26.89)     Time: 8295-6213 PT Time Calculation (min) (ACUTE ONLY): 16 min  Charges:  $Gait Training: 8-22 mins                    G Codes:      Mabeline Caras, PT, DPT Acute Rehab Services  Pager: [place] 05/26/2017, 11:08 AM

## 2017-05-27 DIAGNOSIS — J9622 Acute and chronic respiratory failure with hypercapnia: Secondary | ICD-10-CM

## 2017-05-27 LAB — BASIC METABOLIC PANEL
Anion gap: 10 (ref 5–15)
BUN: 26 mg/dL — ABNORMAL HIGH (ref 6–20)
CHLORIDE: 92 mmol/L — AB (ref 101–111)
CO2: 36 mmol/L — ABNORMAL HIGH (ref 22–32)
CREATININE: 0.89 mg/dL (ref 0.44–1.00)
Calcium: 8.9 mg/dL (ref 8.9–10.3)
Glucose, Bld: 349 mg/dL — ABNORMAL HIGH (ref 65–99)
Potassium: 4.4 mmol/L (ref 3.5–5.1)
SODIUM: 138 mmol/L (ref 135–145)

## 2017-05-27 LAB — CBC
HCT: 42 % (ref 36.0–46.0)
HEMOGLOBIN: 13.4 g/dL (ref 12.0–15.0)
MCH: 30.2 pg (ref 26.0–34.0)
MCHC: 31.9 g/dL (ref 30.0–36.0)
MCV: 94.6 fL (ref 78.0–100.0)
Platelets: 261 10*3/uL (ref 150–400)
RBC: 4.44 MIL/uL (ref 3.87–5.11)
RDW: 13.3 % (ref 11.5–15.5)
WBC: 17.9 10*3/uL — ABNORMAL HIGH (ref 4.0–10.5)

## 2017-05-27 LAB — GLUCOSE, CAPILLARY
GLUCOSE-CAPILLARY: 334 mg/dL — AB (ref 65–99)
GLUCOSE-CAPILLARY: 251 mg/dL — AB (ref 65–99)
Glucose-Capillary: 469 mg/dL — ABNORMAL HIGH (ref 65–99)
Glucose-Capillary: 339 mg/dL — ABNORMAL HIGH (ref 65–99)

## 2017-05-27 LAB — MAGNESIUM: MAGNESIUM: 1.6 mg/dL — AB (ref 1.7–2.4)

## 2017-05-27 MED ORDER — MAGNESIUM SULFATE 2 GM/50ML IV SOLN
2.0000 g | Freq: Once | INTRAVENOUS | Status: AC
  Administered 2017-05-27: 2 g via INTRAVENOUS
  Filled 2017-05-27: qty 50

## 2017-05-27 MED ORDER — INSULIN GLARGINE 100 UNIT/ML ~~LOC~~ SOLN
14.0000 [IU] | Freq: Every day | SUBCUTANEOUS | Status: DC
  Administered 2017-05-28: 14 [IU] via SUBCUTANEOUS
  Filled 2017-05-27: qty 0.14

## 2017-05-27 MED ORDER — GUAIFENESIN ER 600 MG PO TB12
600.0000 mg | ORAL_TABLET | Freq: Two times a day (BID) | ORAL | Status: DC
  Administered 2017-05-27 – 2017-05-28 (×3): 600 mg via ORAL
  Filled 2017-05-27 (×3): qty 1

## 2017-05-27 MED ORDER — INSULIN GLARGINE 100 UNIT/ML ~~LOC~~ SOLN
8.0000 [IU] | Freq: Once | SUBCUTANEOUS | Status: AC
  Administered 2017-05-27: 8 [IU] via SUBCUTANEOUS
  Filled 2017-05-27: qty 0.08

## 2017-05-27 MED ORDER — DOXYCYCLINE HYCLATE 100 MG PO TABS
100.0000 mg | ORAL_TABLET | Freq: Two times a day (BID) | ORAL | Status: DC
  Administered 2017-05-27 – 2017-05-28 (×3): 100 mg via ORAL
  Filled 2017-05-27 (×3): qty 1

## 2017-05-27 NOTE — Progress Notes (Addendum)
Inpatient Diabetes Program Recommendations  AACE/ADA: New Consensus Statement on Inpatient Glycemic Control (2015)  Target Ranges:  Prepandial:   less than 140 mg/dL      Peak postprandial:   less than 180 mg/dL (1-2 hours)      Critically ill patients:  140 - 180 mg/dL   Results for Annette Gibson, Annette L. (MRN [id number]) as of 05/27/2017 11:10  Ref. Range 05/26/2017 08:04 05/26/2017 12:47 05/26/2017 17:46 05/26/2017 21:28  Glucose-Capillary Latest Ref Range: 65 - 99 mg/dL 329 (H)  11 units Novolog +  8 units Lantus 261 (H)  8 units Novolog 277 (H)  8 units Novolog 342 (H)  4 units Novolog   Results for Annette Gibson, Annette L. (MRN [id number]) as of 05/27/2017 11:10  Ref. Range 05/27/2017 07:40  Glucose-Capillary Latest Ref Range: 65 - 99 mg/dL 334 (H)  11 units Novolog  +    8 units Lantus     Home DM Meds: Metformin 1000 mg BID, Amaryl 4 mg daily, Trulicity 1.5 mg weekly   Current Insulin Orders: Lantus 8 units daily      Novolog Moderate Correction Scale/ SSI (0-15 units) TID AC + HS      MD- Note patient receiving Solumedrol 40 mg BID.  CBGs still quite elevated today.  Please consider the following in-hospital insulin adjustments:  1. Increase Lantus to 18 units daily (0.2 units/kg dosing)  2. Start Novolog Meal Coverage: Novolog 6 units TID with meals (hold if pt eats <50% of meal)     --Will follow patient during hospitalization--  Wyn Quaker RN, MSN, CDE Diabetes Coordinator Inpatient Glycemic Control Team Team Pager: [phone] (8a-5p)

## 2017-05-27 NOTE — Progress Notes (Addendum)
Physical Therapy Re-Evaluation & Treatment Patient Details Name: Annette Gibson. Schwegel MRN: [id number] DOB: 05/20/1956 Today's Date: 05/27/2017    History of Present Illness Pt is a 61 y.o. female who presented to ED on 05/23/17 with increased SOB; suspected CHF and COPD exacerbation. Of note, had recently been admitted to OSH 12/11-18 with acute respiratory failure attributed to influenza A and acute CHF. Pertinent PMH includes HTN, DM2, COPD, osteopenia.   PT Comments    Pt re-evaluated for current acute PT and follow-up PT needs per MD request. Pt remains indep with mobility and is able to independently manage O2 tank for ambulation. Demonstrates good self-awareness of activity pacing, able to stop and rest with pursed lip breathing as needed. Encouraged to continue ambulating during rest of hospital admission. Further encouraged maintenance of smoking cessation upon hospital d/c, but pt still unsure of her commitment to doing so. All education completed and questions answered. Pt has no further concerns. D/c acute PT.   Follow Up Recommendations  No PT follow up     Equipment Recommendations  None recommended by PT    Recommendations for Other Services       Precautions / Restrictions Precautions Precautions: Fall Restrictions Weight Bearing Restrictions: No    Mobility  Bed Mobility Overal bed mobility: Independent                Transfers Overall transfer level: Independent Equipment used: None Transfers: Sit to/from Stand              Ambulation/Gait Ambulation/Gait assistance: Independent Ambulation Distance (Feet): 300 Feet Assistive device: None Gait Pattern/deviations: Step-through pattern;Decreased stride length Gait velocity: Decreased   General Gait Details: Amb 300' independently on 6L O2 [place], requiring 1x standing rest break secondary to increased WOB; pt with good awareness to stop, rest, and deep breathe as needed. Pt able to manage portable O2  tank independently   Stairs Stairs: (Declining stair training; states she feels comfortable managing these)          Wheelchair Mobility    Modified Rankin (Stroke Patients Only)       Balance Overall balance assessment: Needs assistance   Sitting balance-Leahy Scale: Good       Standing balance-Leahy Scale: Good               High level balance activites: Side stepping;Backward walking;Direction changes;Turns;Sudden stops              Cognition Arousal/Alertness: Awake/alert Behavior During Therapy: WFL for tasks assessed/performed Overall Cognitive Status: Within Functional Limits for tasks assessed                                        Exercises      General Comments        Pertinent Vitals/Pain Pain Assessment: No/denies pain    Home Living                      Prior Function            PT Goals (current goals can now be found in the care plan section) Progress towards PT goals: Goals met/education completed, patient discharged from PT    Frequency    Min 3X/week      PT Plan      Co-evaluation              AM-PAC PT "  6 Clicks" Daily Activity  Outcome Measure  Difficulty turning over in bed (including adjusting bedclothes, sheets and blankets)?: None Difficulty moving from lying on back to sitting on the side of the bed? : None Difficulty sitting down on and standing up from a chair with arms (e.g., wheelchair, bedside commode, etc,.)?: None Help needed moving to and from a bed to chair (including a wheelchair)?: None Help needed walking in hospital room?: None Help needed climbing 3-5 steps with a railing? : A Little 6 Click Score: 23    End of Session Equipment Utilized During Treatment: Gait belt;Oxygen Activity Tolerance: Patient tolerated treatment well Patient left: in bed;with call bell/phone within reach;with nursing/sitter in room Nurse Communication: Mobility status PT Visit  Diagnosis: Other abnormalities of gait and mobility (R26.89)     Time: 7207-2182 PT Time Calculation (min) (ACUTE ONLY): 16 min  Charges:   1 Re-eval                    G Codes:      Mabeline Caras, PT, DPT Acute Rehab Services  Pager: [place] 05/27/2017, 4:56 PM

## 2017-05-27 NOTE — Progress Notes (Signed)
Patient maintained sats of 6L [place]. No acute distress overnight. Patient up in bed. VSS.

## 2017-05-27 NOTE — Progress Notes (Signed)
PROGRESS NOTE    Annette Gibson  BOE:[id number] DOB: Jan 29, 1956 DOA: 05/23/2017 PCP: Chevis Pretty, FNP   Brief Narrative:  Pt is a 61 y.o. female past medical history relevant for chronic diastolic CHF, anxiety disorder, diabetes mellitus, hypertension, obesity with suspected obstructive sleep apnea and COPD who presented to ED on 05/23/17 with increased SOB; suspected CHF and COPD exacerbation. Of note, had recently been admitted to OSH 05/13/17 thru 05/13/17 at [facility] with acute on chronic respiratory failure attributed to influenza A and acute CHF.  She was apparently discharged without BiPAP/CPAP due to lack of insurance coverage.    She reports that "liver levels" were 7000 at the time, improved to 2000 by time of discharge, and viral hepatitis testing was negative.  She also had melena with stable H&H.Marland Kitchen     Upon arrival to the ED, patient is found to be afebrile, saturating 76% on room air, slightly tachypneic, and modestly hypertensive.  EKG featured a sinus rhythm with right axis deviation and chest x-ray was notable for cardiomegaly, pulmonary vascular congestion, and diffuse interstitial pulmonary edema with small bilateral effusions.  She required use of BIPAP to improve her oxygenation.      Subjective: A/O 4 negative CP, positive chronic SOB was discharged recently from [facility] on home O2 3 L. Also states that she had a sleep study and failed the study,  not on CPAP secondary to no insurance. States stopped smoking on 05/13/2017... Patient refused to use CPAP last night, shortness of breath persists requiring 5 L of oxygen per minute   Assessment & Plan:   Principal Problem:   CHF, acute on chronic (HCC) Active Problems:   Hypertension   COPD with acute exacerbation (HCC)   GAD (generalized anxiety disorder)   Diabetes ([place])   Acute on chronic respiratory failure with hypoxia and hypercapnia (HCC)   Diarrhea   Dark stools    Hypokalemia   1)Acute  on chronic Respiratory failure with Hypoxia - Patient on 3 L O2 via [place] at home: Currently on 5 L O2 to maintain SPO2 89 -93% , Titrate O2 to maintain SPO2 89-93%, hypoxic due to CHF and underlying COPD , suspected steroid-induced leukocytosis, overall improving but very slowly  2)HFpEF-patient with chronic diastolic CHF , echo with EF of 55-60% ,  overall clinically  improved  at this time .  Continue IV Lasix 40 mg bid, daily weight and fluid output and input monitoring  3)COPD with acute exacerbation -improving, continue Solu-Medrol 40 mg every 12 hours, give doxycycline 100 mg twice daily and mucolytics, continue bronchodilators, Flutter valve, please see #1 above   4)OSA (per patient diagnosed at [facility] on recent hospitalization) -Not on CPAP secondary to no insurance, -CPAP per respiratory, patient refused CPAP last night   5)Diarrhea - ?? melena  -   ?? Increased C. difficile risk , however she has not had a bowel movement since her admission and currently appears to be constipated - colitis is unlikely - discontinue contact precautions and follow clinically - pt is not anemic  6)FEN/hypokalemia/hypomagnesemia-watch electrolytes closely with IV diuresis, replace magnesium, potassium is normal   7)Hypertension - continue Coreg 3.125 mg twice daily,  8)Diabetes type 2 uncontrolled with complications- Hemoglobin A1c 7.9 , persistent hyperglycemia due to steroids, diabetic educator consult noted, will increase Lantus insulin 14 from 8 units daily, sliding scale insulin as ordered, glycemic control should improve with tapering of steroids  9)Elevated LFT's - Pt reported "liver levels were 7000"  on recent admission, improved to 2000 by discharge, and viral hepatitis testing negative - LFTs not significantly elevated at this time   10)Tobacco abuse- -Recently discontinued smoking 05/13/2017, congratulated on quitting smoking  11)Generalized  Weakness/Debility/recurrent hospital admissions-get physical therapy eval, ???  DC home with home health services  DVT prophylaxis: SCD Code Status: Full Family Communication: None Disposition Plan: Possible discharge home in 1-2 days, ??? CPAP  Consultants:   Procedures/Significant Events:  12/21 DG [facility]:[id number] with vascular congestion and diffuse interstitial pulmonary edema and small bilateral pleural effusion  Cultures 12/22 MRSA by PCR negative 12/22 respiratory virus panel negative 12/22 HIV negative   Antimicrobials: Anti-infectives (From admission, onward)   None     Objective: Vitals:   05/27/17 0815 05/27/17 0922 05/27/17 1242 05/27/17 1355  BP:  (!) 135/52 (!) 155/65   Pulse:  75 79   Resp:   20   Temp:  97.9 F (36.6 C) 98.1 F (36.7 C)   TempSrc:  Oral Oral   SpO2: 90% 91% 93% 91%  Weight:      Height:        Intake/Output Summary (Last 24 hours) at 05/27/2017 1415 Last data filed at 05/27/2017 1407 Gross per 24 hour  Intake 960 ml  Output 3000 ml  Net -2040 ml   Filed Weights   05/26/17 0303 05/26/17 1918 05/27/17 0527  Weight: 93.5 kg (206 lb 2.1 oz) 93.1 kg (205 lb 3.2 oz) 91.2 kg (201 lb 1.6 oz)   Examination:  General: A/O 4, able to speak in sentences, obese HEENT:- [place].AT, No sclera icterus, [place] 6 L/min Neck-Supple Neck,No JVD,.  Lungs-few scattered wheezes bilaterally, improved air movement CV- S1, S2 normal Abd-  +ve B.Sounds, Abd Soft, No tenderness,   increased truncal adiposity Extremity/Skin:- No  edema,    Neuropsych-appropriate affect, no new focal deficits.   CBC: Recent Labs  Lab 05/23/17 1654 05/24/17 0318 05/25/17 0241 05/26/17 0222 05/27/17 0518  WBC 11.6* 10.4 24.5* 21.8* 17.9*  HGB 14.2 13.5 12.9 13.0 13.4  HCT 45.0 43.3 40.9 40.1 42.0  MCV 97.0 96.7 96.2 95.2 94.6  PLT 228 247 270 256 449   Basic Metabolic Panel: Recent Labs  Lab 05/23/17 1654 05/24/17 0318 05/25/17 0241 05/25/17 0805  05/26/17 0222 05/27/17 0518  NA 141 141 135  --  135 138  K 3.1* 2.8* 4.8  --  4.6 4.4  CL 93* 92* 91*  --  91* 92*  CO2 41* 38* 35*  --  36* 36*  GLUCOSE 205* 285* 382*  --  335* 349*  BUN _0 --  27* 26*  CREATININE 0.89 1.14* 0.92  --  1.00 0.89  CALCIUM 8.9 8.7* 8.3*  --  8.8* 8.9  MG  --  1.4*  --  1.6* 1.8 1.6*   GFR: Estimated Creatinine Clearance: 72.6 mL/min (by C-G formula based on SCr of 0.89 mg/dL). Liver Function Tests: Recent Labs  Lab 05/23/17 2046 05/24/17 0318 05/25/17 0241  AST 29 42* 29  ALT 123* 114* 83*  ALKPHOS 66 64 68  BILITOT 1.4* 0.8 0.9  PROT 6.0* 5.8* 5.2*  ALBUMIN 3.2* 3.0* 2.9*   No results for input(s): LIPASE, AMYLASE in the last 168 hours. No results for input(s): AMMONIA in the last 168 hours. Coagulation Profile: No results for input(s): INR, PROTIME in the last 168 hours. Cardiac Enzymes: Recent Labs  Lab 05/23/17 2046  TROPONINI 0.05*   BNP (last 3 results) No results for input(s):  PROBNP in the last 8760 hours. HbA1C: Recent Labs    05/25/17 0241  HGBA1C 7.9*   CBG: Recent Labs  Lab 05/26/17 1247 05/26/17 1746 05/26/17 2128 05/27/17 0740 05/27/17 1112  GLUCAP 261* 277* 342* 334* 469*   Lipid Profile: Recent Labs    05/26/17 0222  CHOL 129  HDL 53  LDLCALC 56  TRIG 98  CHOLHDL 2.4   Thyroid Function Tests: No results for input(s): TSH, T4TOTAL, FREET4, T3FREE, THYROIDAB in the last 72 hours. Anemia Panel: No results for input(s): VITAMINB12, FOLATE, FERRITIN, TIBC, IRON, RETICCTPCT in the last 72 hours. Urine analysis:    Component Value Date/Time   BILIRUBINUR neg 07/25/2014 0838   PROTEINUR neg 07/25/2014 0838   UROBILINOGEN negative 07/25/2014 0838   NITRITE pos 07/25/2014 0838   LEUKOCYTESUR Trace 07/25/2014 0838   Sepsis Labs: _0 (procalcitonin:4,lacticidven:4)  ) Recent Results (from the past 240 hour(s))  Respiratory Panel by PCR     Status: None   Collection Time: 05/24/17   4:36 AM  Result Value Ref Range Status   Adenovirus NOT DETECTED NOT DETECTED Final   Coronavirus 229E NOT DETECTED NOT DETECTED Final   Coronavirus HKU1 NOT DETECTED NOT DETECTED Final   Coronavirus NL63 NOT DETECTED NOT DETECTED Final   Coronavirus OC43 NOT DETECTED NOT DETECTED Final   Metapneumovirus NOT DETECTED NOT DETECTED Final   Rhinovirus / Enterovirus NOT DETECTED NOT DETECTED Final   Influenza A NOT DETECTED NOT DETECTED Final   Influenza B NOT DETECTED NOT DETECTED Final   Parainfluenza Virus 1 NOT DETECTED NOT DETECTED Final   Parainfluenza Virus 2 NOT DETECTED NOT DETECTED Final   Parainfluenza Virus 3 NOT DETECTED NOT DETECTED Final   Parainfluenza Virus 4 NOT DETECTED NOT DETECTED Final   Respiratory Syncytial Virus NOT DETECTED NOT DETECTED Final   Bordetella pertussis NOT DETECTED NOT DETECTED Final   Chlamydophila pneumoniae NOT DETECTED NOT DETECTED Final   Mycoplasma pneumoniae NOT DETECTED NOT DETECTED Final  MRSA PCR Screening     Status: None   Collection Time: 05/24/17  4:36 AM  Result Value Ref Range Status   MRSA by PCR NEGATIVE NEGATIVE Final    Comment:        The GeneXpert MRSA Assay (FDA approved for NASAL specimens only), is one component of a comprehensive MRSA colonization surveillance program. It is not intended to diagnose MRSA infection nor to guide or monitor treatment for MRSA infections.          Radiology Studies: No results found.      Scheduled Meds: . atorvastatin  40 mg Oral q1800  . budesonide (PULMICORT) nebulizer solution  0.25 mg Nebulization BID  . carvedilol  3.125 mg Oral BID WC  . furosemide  40 mg Intravenous Q12H  . insulin aspart  0-15 Units Subcutaneous TID WC  . insulin aspart  0-5 Units Subcutaneous QHS  . insulin glargine  8 Units Subcutaneous Daily  . ipratropium-albuterol  3 mL Nebulization TID  . methylPREDNISolone (SOLU-MEDROL) injection  40 mg Intravenous Q12H     Roxan Hockey, MD [facility] Pager [phone]   If 7PM-7AM, please contact night-coverage [email or web address] Password [email or web address] 05/27/2017, 2:15 PM

## 2017-05-28 LAB — CBC
HCT: 46 % (ref 36.0–46.0)
Hemoglobin: 15.1 g/dL — ABNORMAL HIGH (ref 12.0–15.0)
MCH: 30.9 pg (ref 26.0–34.0)
MCHC: 32.8 g/dL (ref 30.0–36.0)
MCV: 94.1 fL (ref 78.0–100.0)
PLATELETS: 251 10*3/uL (ref 150–400)
RBC: 4.89 MIL/uL (ref 3.87–5.11)
RDW: 13.4 % (ref 11.5–15.5)
WBC: 19.5 10*3/uL — AB (ref 4.0–10.5)

## 2017-05-28 LAB — BASIC METABOLIC PANEL
Anion gap: 11 (ref 5–15)
BUN: 31 mg/dL — AB (ref 6–20)
CALCIUM: 9.6 mg/dL (ref 8.9–10.3)
CO2: 33 mmol/L — ABNORMAL HIGH (ref 22–32)
CREATININE: 0.86 mg/dL (ref 0.44–1.00)
Chloride: 92 mmol/L — ABNORMAL LOW (ref 101–111)
GFR calc Af Amer: >60 mL/min (ref >60)
GLUCOSE: 275 mg/dL — AB (ref 65–99)
Potassium: 4 mmol/L (ref 3.5–5.1)
Sodium: 136 mmol/L (ref 135–145)

## 2017-05-28 LAB — MAGNESIUM: Magnesium: 1.6 mg/dL — ABNORMAL LOW (ref 1.7–2.4)

## 2017-05-28 LAB — GLUCOSE, CAPILLARY
Glucose-Capillary: 230 mg/dL — ABNORMAL HIGH (ref 65–99)
Glucose-Capillary: 263 mg/dL — ABNORMAL HIGH (ref 65–99)

## 2017-05-28 MED ORDER — PREDNISONE 20 MG PO TABS: 40.0000 mg | ORAL_TABLET | Freq: Every day | ORAL | 0 refills | Status: DC

## 2017-05-28 MED ORDER — GUAIFENESIN ER 600 MG PO TB12
600.0000 mg | ORAL_TABLET | Freq: Two times a day (BID) | ORAL | 0 refills | Status: DC
Start: 2017-05-28 — End: 2017-06-11

## 2017-05-28 MED ORDER — ALBUTEROL SULFATE HFA 108 (90 BASE) MCG/ACT IN AERS: 1.0000 | INHALATION_SPRAY | Freq: Four times a day (QID) | RESPIRATORY_TRACT | 1 refills | Status: AC | PRN

## 2017-05-28 MED ORDER — FUROSEMIDE 40 MG PO TABS: ORAL_TABLET | ORAL | 5 refills | Status: DC

## 2017-05-28 MED ORDER — DOXYCYCLINE HYCLATE 100 MG PO TABS: 100.0000 mg | ORAL_TABLET | Freq: Two times a day (BID) | ORAL | 0 refills | Status: DC

## 2017-05-28 MED ORDER — CARVEDILOL 3.125 MG PO TABS: 3.1250 mg | ORAL_TABLET | Freq: Two times a day (BID) | ORAL | 1 refills | Status: DC

## 2017-05-28 MED ORDER — ALBUTEROL SULFATE (2.5 MG/3ML) 0.083% IN NEBU: 2.5000 mg | INHALATION_SOLUTION | Freq: Four times a day (QID) | RESPIRATORY_TRACT | 1 refills | Status: AC | PRN

## 2017-05-28 NOTE — Discharge Summary (Signed)
Annette Gibson, is a 61 y.o. female  DOB 02-27-1956  MRN [id number].  Admission date:  05/23/2017  Admitting Physician  [name], MD  Discharge Date:  05/28/2017   Primary MD  Chevis Pretty, FNP  Recommendations for primary care physician for things to follow:  PCP to try to obtain records from [facility]--- ?? Elevated LFts  Admission Diagnosis  COPD exacerbation (HCC) [J44.1] Acute on chronic respiratory failure with hypoxia (HCC) [J96.21]   Discharge Diagnosis  COPD exacerbation (HCC) [J44.1] Acute on chronic respiratory failure with hypoxia (HCC) [J96.21]    Principal Problem:   CHF, acute on chronic (HCC) Active Problems:   Hypertension   COPD with acute exacerbation (HCC)   GAD (generalized anxiety disorder)   Diabetes ([place])   Acute on chronic respiratory failure with hypoxia and hypercapnia (HCC)   Diarrhea   Dark stools   Hypokalemia      Past Medical History:  Diagnosis Date  . Anal warts    anal warts  . Anxiety   . Congestive heart failure (CHF) ([place])   . COPD (chronic obstructive pulmonary disease) ([place])   . Diabetes mellitus without complication ([place])   . Hypertriglyceridemia   . Obesity    metabolic syndrome   . Osteopenia   . Vitamin D deficiency     Past Surgical History:  Procedure Laterality Date  . ABDOMINAL HYSTERECTOMY    . FRACTURE SURGERY     Right leg and ankle  . lt ganglion cyst removal  09/2007       HPI  from the history and physical done on the day of admission:    Chief Complaint: SOB, swelling, non-productive cough, diarrhea   HPI: Annette Gibson is a 61 y.o. female with medical history significant for CHF, COPD, type 2 diabetes mellitus, hypertension, now presenting to the emergency department for evaluation of shortness of breath, swelling, and diarrhea.  Patient reports that she was admitted to an outside  hospital from 05/13/2017 until 05/20/2017 with acute respiratory failure attributed to influenza A and acute CHF.  She reports that "liver levels" were 7000 at the time, improved to 2000 by time of discharge, and viral hepatitis testing was negative.  She also had melena with stable H&H.  Since time of discharge, patient has been short of breath with minimal exertion and has been experiencing numerous episodes of diarrhea daily.  There is no blood noted in the stool, but it is dark.  Patient denies fevers or chills, reports a cough that is nonproductive, and reports increased lower extremity swelling bilaterally since the hospital discharge.  She was reportedly saturating in the 70s when she saw her PCP today and directed to the ED for further evaluation.  Denies chest pain or palpitations.  Had a headache earlier that is resolved.  No focal numbness or weakness and no change in vision or hearing.  ED Course: Upon arrival to the ED, patient is found to be afebrile, saturating 76% on room air, slightly tachypneic, and  modestly hypertensive.  EKG features a sinus rhythm with right axis deviation and chest x-ray is notable for cardiomegaly, pulmonary vascular congestion, and diffuse interstitial pulmonary edema with small bilateral effusions.  Chemistry panel is notable for potassium 3.1 and glucose of 205.  CBC reveals a mild leukocytosis to 11,500.  BNP is slightly elevated 242.  Patient was placed on BiPAP, treated with continuous albuterol x3, Atrovent, 40 mEq oral potassium, 125 mg IV Solu-Medrol, and 40 mg IV Lasix in the ED.  She has remained hemodynamically stable, but continues to require BiPAP and will be admitted to the stepdown unit for ongoing evaluation and management of acute on chronic respiratory failure suspected secondary to acute on chronic CHF and likely exacerbation and COPD.   Hospital Course:     Brief Narrative:  Pt is a 61 y.o. female past medical history relevant for chronic  diastolic CHF, anxiety disorder, diabetes mellitus, hypertension, obesity with suspected obstructive sleep apnea and COPD who presented to ED on 05/23/17 with increased SOB; suspected CHF and COPD exacerbation. Of note, had recently been admitted to OSH 05/13/17 thru 05/13/17 at [place] with acute on chronic respiratory failure attributed to influenza A and acute CHF.  She was apparently discharged without BiPAP/CPAP due to lack of insurance coverage.   She reports that "liver levels" were 7000 at the time, improved to 2000 by time of discharge, and viral hepatitis testing was negative. She also had melena with stable H&H.Marland Kitchen   Upon arrival to the ED, patient is found to be afebrile, saturating 76% on room air, slightly tachypneic, and modestly hypertensive. EKG featureda sinus rhythm with right axis deviation and chest x-ray was notable for cardiomegaly, pulmonary vascular congestion, and diffuse interstitial pulmonary edema with small bilateral effusions.She required use of BIPAP to improve her oxygenation.  positive chronic SOB was discharged recently from [facility] on home O2 3 L. Also states that she had a sleep study and failed the study,  not on CPAP secondary to no insurance. States stopped smoking on 05/13/2017... Patient refuses to use CPAP at night here, shortness of breath persists requiring 5 L of oxygen per minute   Plan:- 1)Acute on chronic Respiratory failure with Hypoxia -clinically much improved from a respiratory standpoint, oxygen requirement is back to baseline, patient on 3 L O2 via  at home: c/n O2 to maintain SPO2 89-93%, hypoxic due to CHF and underlying COPD , suspected steroid-induced leukocytosis.  2)HFpEF-patient with chronic diastolic CHF , echo with EF of 55-60% ,  overall clinically  improved  at this time .    Discharged on p.o. Lasix, call if weight gain as advised  3)COPD with acute exacerbation-improving, discharged home on prednisone and  doxycycline 100 mg twice daily and mucolytics, continue bronchodilators, Flutter valve, please see #1 above   4)OSA (per patient diagnosed at [facility] on recent hospitalization) -Not on CPAP secondary to no insurance, -CPAP per respiratory, patient refused CPAP at night here  5)Diarrhea- ??melena -   ?? Increased C. difficile risk , however she has not had a bowel movement since her admission and currently appears to be constipated- colitis is unlikely-discontinue contact precautions and follow clinically- pt is not anemic  6)FEN/hypokalemia/hypomagnesemia-due to diuresis, now resolved, potassium is normal  7)Hypertension- continue Coreg 3.125 mg twice daily,  8)Diabetes type 2 uncontrolled with complications- Hemoglobin A1c 7.9 , persistent hyperglycemia due to steroids, diabetic educator consult noted, glycemic control to improve with tapering dose of steroids  9)Elevated LFT's- Pt reported"liver  levels were 7000" on recent admission, improved to 2000 by discharge, and viral hepatitis testing negative- LFTs not significantly elevated at this time,.  Follow-up with PCP, PCP to try to obtain records from [facility]  10)Tobacco abuse- -Recently discontinued smoking 05/13/2017, congratulated on quitting smoking  11)Generalized Weakness/Debility/recurrent hospital admissions- physical therapy eval appreciated,, no significant home health needs identified DC home with home health services  Discharge Condition: stable  Diet and Activity recommendation:  As advised  Discharge Instructions     Discharge Instructions    (HEART FAILURE PATIENTS) Call MD:  Anytime you have any of the following symptoms: 1) 3 pound weight gain in 24 hours or 5 pounds in 1 week 2) shortness of breath, with or without a dry hacking cough 3) swelling in the hands, feet or stomach 4) if you have to sleep on extra pillows at night in order to breathe.   Complete by:  As directed    Call  MD for:   Complete by:  As directed    Call MD for:  difficulty breathing, headache or visual disturbances   Complete by:  As directed    Call MD for:  persistant dizziness or light-headedness   Complete by:  As directed    Call MD for:  persistant nausea and vomiting   Complete by:  As directed    Call MD for:  redness, tenderness, or signs of infection (pain, swelling, redness, odor or green/yellow discharge around incision site)   Complete by:  As directed    Call MD for:  severe uncontrolled pain   Complete by:  As directed    Call MD for:  temperature >100.4   Complete by:  As directed    Diet - low sodium heart healthy   Complete by:  As directed    Discharge instructions   Complete by:  As directed    Avoid tobacco exposure Discharge home with home oxygen Take medications as prescribed Follow-up with your primary care doctor within a week Avoid ibuprofen/Advil/Aleve/Motrin/Goody Powders/Naproxen/BC powders as these will make you more likely to bleed and can cause stomach ulcers   Increase activity slowly   Complete by:  As directed         Discharge Medications     Allergies as of 05/28/2017      Reactions   Celebrex [celecoxib] Other (See Comments)   Caused seizure   Voltaren [diclofenac Sodium] Other (See Comments)   Caused seizure      Medication List    TAKE these medications   albuterol 108 (90 Base) MCG/ACT inhaler Commonly known as:  PROVENTIL HFA;VENTOLIN HFA Inhale 1 puff into the lungs every 6 (six) hours as needed for wheezing or shortness of breath.   albuterol (2.5 MG/3ML) 0.083% nebulizer solution Commonly known as:  PROVENTIL Take 3 mLs (2.5 mg total) by nebulization every 6 (six) hours as needed for wheezing or shortness of breath.   Apple Cider Vinegar 500 MG Tabs Take 500 mg by mouth 2 (two) times daily.   atorvastatin 40 MG tablet Commonly known as:  LIPITOR Take 1 tablet (40 mg total) by mouth daily at 6 PM. What changed:    how  much to take  how to take this  when to take this  additional instructions   carvedilol 3.125 MG tablet Commonly known as:  COREG Take 1 tablet (3.125 mg total) by mouth 2 (two) times daily with a meal.   cholecalciferol 1000 units tablet Commonly known as:  VITAMIN D Take 1,000 Units by mouth daily.   doxycycline 100 MG tablet Commonly known as:  VIBRA-TABS Take 1 tablet (100 mg total) by mouth every 12 (twelve) hours.   Dulaglutide 1.5 MG/0.5ML Sopn Commonly known as:  TRULICITY Inject 1.5 mg into the skin once a week.   fenofibrate micronized 134 MG capsule Commonly known as:  LOFIBRA Take 1 capsule (134 mg total) by mouth daily before breakfast.   fluticasone furoate-vilanterol 100-25 MCG/INH Aepb Commonly known as:  BREO ELLIPTA INHALE 1 PUFF INTO LUNGS DAILY AS DIRECTED What changed:    how much to take  how to take this  when to take this  additional instructions   furosemide 40 MG tablet Commonly known as:  LASIX 1 tablet 40 mg every morning and 1/2 tablet (20 mg) every evening What changed:    how much to take  how to take this  when to take this  additional instructions   glimepiride 4 MG tablet Commonly known as:  AMARYL Take 1 tablet (4 mg total) by mouth daily before breakfast.   guaiFENesin 600 MG 12 hr tablet Commonly known as:  MUCINEX Take 1 tablet (600 mg total) by mouth 2 (two) times daily.   HAIR SKIN & NAILS GUMMIES 1250-7.5-7.5 MCG-MG-UNT Chew Generic drug:  Biotin w/ Vitamins C & E Chew 1 tablet by mouth daily.   metFORMIN 1000 MG tablet Commonly known as:  GLUCOPHAGE TAKE 1 TABLET BY MOUTH TWICE DAILY WITH A MEAL What changed:  See the new instructions.   multivitamin with minerals Tabs tablet Take 1 tablet by mouth daily.   ONETOUCH VERIO test strip Generic drug:  glucose blood CHECK BLOOD SUGAR ONCE DAILY OR AS DIRECTED   predniSONE 20 MG tablet Commonly known as:  DELTASONE Take 2 tablets (40 mg total) by mouth  daily with breakfast.   VITAMIN B-12 PO Take 3,000 mcg by mouth daily.       Major procedures and Radiology Reports - PLEASE review detailed and final reports for all details, in brief -   Dg Chest 2 View  Result Date: 05/23/2017 CLINICAL DATA:  Shortness of breath EXAM: CHEST  2 VIEW COMPARISON:  05/14/2017 FINDINGS: Small bilateral pleural effusions. Borderline to mild cardiomegaly with vascular congestion and diffuse interstitial opacity consistent with pulmonary edema. No focal consolidation. No pneumothorax. IMPRESSION: Cardiomegaly with vascular congestion and diffuse interstitial pulmonary edema and small bilateral pleural effusion. Electronically Signed   By: Donavan Foil M.D.   On: 05/23/2017 17:59    Micro Results    Recent Results (from the past 240 hour(s))  Respiratory Panel by PCR     Status: None   Collection Time: 05/24/17  4:36 AM  Result Value Ref Range Status   Adenovirus NOT DETECTED NOT DETECTED Final   Coronavirus 229E NOT DETECTED NOT DETECTED Final   Coronavirus HKU1 NOT DETECTED NOT DETECTED Final   Coronavirus NL63 NOT DETECTED NOT DETECTED Final   Coronavirus OC43 NOT DETECTED NOT DETECTED Final   Metapneumovirus NOT DETECTED NOT DETECTED Final   Rhinovirus / Enterovirus NOT DETECTED NOT DETECTED Final   Influenza A NOT DETECTED NOT DETECTED Final   Influenza B NOT DETECTED NOT DETECTED Final   Parainfluenza Virus 1 NOT DETECTED NOT DETECTED Final   Parainfluenza Virus 2 NOT DETECTED NOT DETECTED Final   Parainfluenza Virus 3 NOT DETECTED NOT DETECTED Final   Parainfluenza Virus 4 NOT DETECTED NOT DETECTED Final   Respiratory Syncytial Virus NOT DETECTED NOT DETECTED  Final   Bordetella pertussis NOT DETECTED NOT DETECTED Final   Chlamydophila pneumoniae NOT DETECTED NOT DETECTED Final   Mycoplasma pneumoniae NOT DETECTED NOT DETECTED Final  MRSA PCR Screening     Status: None   Collection Time: 05/24/17  4:36 AM  Result Value Ref Range Status    MRSA by PCR NEGATIVE NEGATIVE Final    Comment:        The GeneXpert MRSA Assay (FDA approved for NASAL specimens only), is one component of a comprehensive MRSA colonization surveillance program. It is not intended to diagnose MRSA infection nor to guide or monitor treatment for MRSA infections.        Today   Subjective    Annette Gibson today has no new c/o, still refusing to use bipap/cpap          Patient has been seen and examined prior to discharge   Objective   Blood pressure (!) 131/57, pulse 79, temperature 97.8 F (36.6 C), temperature source Oral, resp. rate 20, height _0  (1.626 m), weight 89.4 kg (197 lb 3.2 oz), SpO2 92 %.   Intake/Output Summary (Last 24 hours) at 05/28/2017 1631 Last data filed at 05/28/2017 0932 Gross per 24 hour  Intake 720 ml  Output 1100 ml  Net -380 ml    Exam Gen:- Awake  In no apparent distress  HEENT:- [place].AT,  BC at 3L/min Neck-Supple Neck,No JVD,  Lungs- mostly clear  CV- S1, S2 normal Abd-  +ve B.Sounds, Abd Soft, No tenderness,    Extremity/Skin:- Intact peripheral pulses   Neuropsych-affect is appropriate, no new focal deficits   Data Review   CBC w Diff:  Lab Results  Component Value Date   WBC 19.5 (H) 05/28/2017   HGB 15.1 (H) 05/28/2017   HCT 46.0 05/28/2017   PLT 251 05/28/2017    CMP:  Lab Results  Component Value Date   NA 136 05/28/2017   NA 147 (H) 12/27/2016   K 4.0 05/28/2017   CL 92 (L) 05/28/2017   CO2 33 (H) 05/28/2017   BUN 31 (H) 05/28/2017   BUN 9 12/27/2016   CREATININE 0.86 05/28/2017   PROT 5.2 (L) 05/25/2017   PROT 6.5 12/27/2016   ALBUMIN 2.9 (L) 05/25/2017   ALBUMIN 4.0 12/27/2016   BILITOT 0.9 05/25/2017   BILITOT 0.4 12/27/2016   ALKPHOS 68 05/25/2017   AST 29 05/25/2017   ALT 83 (H) 05/25/2017  . Total Discharge time is about 33 minutes  Roxan Hockey M.D on 05/28/2017 at 4:31 PM  [facility]   Office  [phone]  Voice Recognition Viviann Spare  dictation system was used to create this note, attempts have been made to correct errors. Please contact the author with questions and/or clarifications.

## 2017-05-28 NOTE — Progress Notes (Signed)
Occupational Therapy Treatment and Discharge Patient Details Name: Annette Gibson MRN: [id number] DOB: 1956-05-22 Today's Date: 05/28/2017    History of present illness Pt is a 61 y.o. female who presented to ED on 05/23/17 with increased SOB; suspected CHF and COPD exacerbation. Of note, had recently been admitted to OSH 12/11-18 with acute respiratory failure attributed to influenza A and acute CHF. Pertinent PMH includes HTN, DM2, COPD, osteopenia.   OT comments  Pt demonstrating excellent progress this session. She was able to verbalize all strategies to conserve energy during daily routine independently this session. She additionally demonstrated the ability to complete basic ADL with modified independence. Pt was able to manage her own O2 tank during simulated IADL tasks and safely navigate cluttered hallway avoiding water spill on the ground. Additionally discussed smoking cessation and programs to ease this transition and offered to notify case management but pt declined reporting that she will consider this on her own. All education complete at this time and no further acute OT needs identified. OT will sign off.    Follow Up Recommendations  No OT follow up    Equipment Recommendations  None recommended by OT    Recommendations for Other Services      Precautions / Restrictions Precautions Precautions: Fall Precaution Comments: High O2 needs Restrictions Weight Bearing Restrictions: No       Mobility Bed Mobility Overal bed mobility: Modified Independent                Transfers Overall transfer level: Modified independent                    Balance Overall balance assessment: Needs assistance Sitting-balance support: No upper extremity supported;Feet supported Sitting balance-Leahy Scale: Good     Standing balance support: Single extremity supported;During functional activity;No upper extremity supported Standing balance-Leahy Scale: Good                              ADL either performed or assessed with clinical judgement   ADL Overall ADL's : Modified independent                                       General ADL Comments: Discussed energy conservation strategies and pt able to teach back appropriately. She was able to complete ADL and functional mobility at modified independent level identifying risks in hallway and managing her own O2 tank.      Vision   Vision Assessment?: No apparent visual deficits   Perception     Praxis      Cognition Arousal/Alertness: Awake/alert Behavior During Therapy: WFL for tasks assessed/performed Overall Cognitive Status: Within Functional Limits for tasks assessed                                          Exercises     Shoulder Instructions       General Comments Pt managing O2 tank this session. On 4L O2 via [place] at rest and increased to 6LO2 for ambulation in hallway.    Pertinent Vitals/ Pain       Pain Assessment: No/denies pain  Home Living  Prior Functioning/Environment              Frequency  Min 2X/week        Progress Toward Goals  OT Goals(current goals can now be found in the care plan section)  Progress towards OT goals: Progressing toward goals  Acute Rehab OT Goals Patient Stated Goal: Return home OT Goal Formulation: With patient Time For Goal Achievement: 06/01/17 Potential to Achieve Goals: Good ADL Goals Additional ADL Goal #1: Pt will verbalize 3 strategies to conserve energy during daily ADL routine independently.  Plan Discharge plan remains appropriate    Co-evaluation                 AM-PAC PT "6 Clicks" Daily Activity     Outcome Measure   Help from another person eating meals?: None Help from another person taking care of personal grooming?: None Help from another person toileting, which includes using toliet, bedpan,  or urinal?: None Help from another person bathing (including washing, rinsing, drying)?: None Help from another person to put on and taking off regular upper body clothing?: None Help from another person to put on and taking off regular lower body clothing?: None 6 Click Score: 24    End of Session Equipment Utilized During Treatment: Oxygen  OT Visit Diagnosis: Unsteadiness on feet (R26.81);Other (comment)(decreased activity tolerance)   Activity Tolerance Patient limited by fatigue   Patient Left with call bell/phone within reach;in bed(sitting at EOB)   Nurse Communication          Time: 1415-1430 OT Time Calculation (min): 15 min  Charges: OT General Charges $OT Visit: 1 Visit OT Treatments $Self Care/Home Management : 8-22 mins  Norman Herrlich, MS OTR/L  Pager: [place] 05/28/2017, 2:59 PM

## 2017-05-28 NOTE — Discharge Instructions (Signed)
Avoid tobacco exposure Discharge home with home oxygen Take medications as prescribed Follow-up with your primary care doctor within a week Avoid ibuprofen/Advil/Aleve/Motrin/Goody Powders/Naproxen/BC powders as these will make you more likely to bleed and can cause stomach ulcers

## 2017-05-28 NOTE — Progress Notes (Signed)
Inpatient Diabetes Program Recommendations  AACE/ADA: New Consensus Statement on Inpatient Glycemic Control (2015)  Target Ranges:  Prepandial:   less than 140 mg/dL      Peak postprandial:   less than 180 mg/dL (1-2 hours)      Critically ill patients:  140 - 180 mg/dL   Lab Results  Component Value Date   GLUCAP 230 (H) 05/28/2017   HGBA1C 7.9 (H) 05/25/2017    Review of Glycemic ControlResults for Annette Gibson, Annette L. (MRN [id number]) as of 05/28/2017 10:45  Ref. Range 05/27/2017 07:40 05/27/2017 11:12 05/27/2017 16:47 05/27/2017 20:49 05/28/2017 07:33  Glucose-Capillary Latest Ref Range: 65 - 99 mg/dL 334 (H) 469 (H) 251 (H) 339 (H) 230 (H)    Diabetes history: Type 2 DM Home DM Meds: Metformin 1000 mg BID, Amaryl 4 mg daily, Trulicity 1.5 mg weekly  Current Insulin Orders: Lantus 14 units daily                                       Novolog Moderate Correction Scale/ SSI (0-15 units) TID AC + HS                                       Solumedrol 40 mg IV q 12 hours  Inpatient Diabetes Program Recommendations:   Please consider the following in-hospital insulin adjustments. Text page sent to MD.  1. Increase Lantus to 18 units daily (0.2 units/kg dosing)  2. Start Novolog Meal Coverage: Novolog 6 units TID with meals (hold if pt eats <50% of meal)  Thanks,  Adah Perl, RN, BC-ADM Inpatient Diabetes Coordinator Pager [phone] (8a-5p)

## 2017-06-11 ENCOUNTER — Encounter: Payer: Self-pay | Admitting: Family Medicine

## 2017-06-11 ENCOUNTER — Ambulatory Visit (INDEPENDENT_AMBULATORY_CARE_PROVIDER_SITE_OTHER): Payer: Self-pay | Admitting: Family Medicine

## 2017-06-11 VITALS — BP 127/75 | HR 96 | Temp 97.0°F | Ht 64.0 in | Wt 193.0 lb

## 2017-06-11 DIAGNOSIS — Z09 Encounter for follow-up examination after completed treatment for conditions other than malignant neoplasm: Secondary | ICD-10-CM

## 2017-06-11 DIAGNOSIS — I509 Heart failure, unspecified: Secondary | ICD-10-CM

## 2017-06-11 DIAGNOSIS — R112 Nausea with vomiting, unspecified: Secondary | ICD-10-CM

## 2017-06-11 NOTE — Progress Notes (Signed)
Subjective: CC: fall/ vomiting PCP: Chevis Pretty, FNP NOT:[id number] Annette Gibson is a 62 y.o. female, who is accompanied today's visit by her daughter. She is presenting to clinic today for:  1. Fall/ vomiting/hospital follow-up Patient was admitted twice in the month of December.  Initial admission was at [facility] of her reports.  At that time she was noted to have significantly elevated LFTs.  Upon readmission to [facility], she was noted to have acute on chronic congestive heart failure.  No substantial elevation of the LFTs were noted.  She reports that she has been breathing well on 2 L of oxygen via nasal cannula.  She reports no lower extremity edema.  No chest pain.  She has not yet seen the cardiologist.  Today she presents for hospital follow-up.  She notes that she has had 2 mechanical falls since Christmas.  She reports daily nausea with vomiting.  Last episode of vomiting was 2 days ago.  Vomiting was nonbloody and nonbilious.  She has been using Zofran intermittently for symptoms.  She thought that the nausea and vomiting may be related to the doxycycline, which she stopped about 2-3 days ago.  She reports good control of her blood sugar.  Denies hematochezia, melena.  She is having normal bowel movements.  Denies fevers, chills.   ROS: Per HPI  Allergies  Allergen Reactions  . Celebrex [Celecoxib] Other (See Comments)    Caused seizure  . Voltaren [Diclofenac Sodium] Other (See Comments)    Caused seizure    Past Medical History:  Diagnosis Date  . Anal warts    anal warts  . Anxiety   . Congestive heart failure (CHF) ([place])   . COPD (chronic obstructive pulmonary disease) ([place])   . Diabetes mellitus without complication ([place])   . Hypertriglyceridemia   . Obesity    metabolic syndrome   . Osteopenia   . Vitamin D deficiency     Current Outpatient Medications:  .  albuterol (PROVENTIL HFA;VENTOLIN HFA) 108 (90 Base) MCG/ACT inhaler, Inhale 1 puff  into the lungs every 6 (six) hours as needed for wheezing or shortness of breath., Disp: 1 Inhaler, Rfl: 1 .  albuterol (PROVENTIL) (2.5 MG/3ML) 0.083% nebulizer solution, Take 3 mLs (2.5 mg total) by nebulization every 6 (six) hours as needed for wheezing or shortness of breath., Disp: 150 mL, Rfl: 1 .  Apple Cider Vinegar 500 MG TABS, Take 500 mg by mouth 2 (two) times daily., Disp: , Rfl:  .  atorvastatin (LIPITOR) 40 MG tablet, Take 1 tablet (40 mg total) by mouth daily at 6 PM. (Patient taking differently: Take 40 mg by mouth daily at 6 PM. ), Disp: 30 tablet, Rfl: 5 .  Biotin w/ Vitamins C & E (HAIR SKIN & NAILS GUMMIES) 1250-7.5-7.5 MCG-MG-UNT CHEW, Chew 1 tablet by mouth daily., Disp: , Rfl:  .  carvedilol (COREG) 3.125 MG tablet, Take 1 tablet (3.125 mg total) by mouth 2 (two) times daily with a meal., Disp: 60 tablet, Rfl: 1 .  Cyanocobalamin (VITAMIN B-12 PO), Take 3,000 mcg by mouth daily., Disp: , Rfl:  .  Dulaglutide (TRULICITY) 1.5 UX/8.3FX SOPN, Inject 1.5 mg into the skin once a week., Disp: 6 pen, Rfl: 0 .  fenofibrate micronized (LOFIBRA) 134 MG capsule, Take 1 capsule (134 mg total) by mouth daily before breakfast., Disp: 30 capsule, Rfl: 11 .  fluticasone furoate-vilanterol (BREO ELLIPTA) 100-25 MCG/INH AEPB, INHALE 1 PUFF INTO LUNGS DAILY AS DIRECTED (Patient taking differently: Inhale 1  puff into the lungs daily. ), Disp: 60 each, Rfl: 2 .  furosemide (LASIX) 40 MG tablet, 1 tablet 40 mg every morning and 1/2 tablet (20 mg) every evening, Disp: 30 tablet, Rfl: 5 .  glimepiride (AMARYL) 4 MG tablet, Take 1 tablet (4 mg total) by mouth daily before breakfast., Disp: 30 tablet, Rfl: 4 .  metFORMIN (GLUCOPHAGE) 1000 MG tablet, TAKE 1 TABLET BY MOUTH TWICE DAILY WITH A MEAL, Disp: 120 tablet, Rfl: 0 .  Multiple Vitamin (MULTIVITAMIN WITH MINERALS) TABS tablet, Take 1 tablet by mouth daily., Disp: , Rfl:  .  cholecalciferol (VITAMIN D) 1000 units tablet, Take 1,000 Units by mouth  daily., Disp: , Rfl:  Social History   Socioeconomic History  . Marital status: Single    Spouse name: Not on file  . Number of children: Not on file  . Years of education: Not on file  . Highest education level: Not on file  Social Needs  . Financial resource strain: Not on file  . Food insecurity - worry: Not on file  . Food insecurity - inability: Not on file  . Transportation needs - medical: Not on file  . Transportation needs - non-medical: Not on file  Occupational History  . Not on file  Tobacco Use  . Smoking status: Current Every Day Smoker  . Smokeless tobacco: Never Used  Substance and Sexual Activity  . Alcohol use: No  . Drug use: No  . Sexual activity: Not on file  Other Topics Concern  . Not on file  Social History Narrative  . Not on file   Family History  Problem Relation Age of Onset  . Pneumonia Mother   . Diabetes Mother     Objective: Office vital signs reviewed. BP 127/75   Pulse 96   Temp (!) 97 F (36.1 C) (Oral)   Ht _0  (1.626 m)   Wt 193 lb (87.5 kg)   SpO2 93%   BMI 33.13 kg/m   Physical Examination:  General: Awake, alert, chronically ill appearing female, No acute distress HEENT: Normal    Neck: No masses palpated. No lymphadenopathy; no JVD    Eyes: PERRLA, extraocular membranes intact, sclera white    Throat: moist mucus membranes, no erythema, no tonsillar exudate.  Airway is patent Cardio: regular rate and rhythm, S1S2 heard, no murmurs appreciated Pulm: clear to auscultation bilaterally, no wheezes, rhonchi or rales; normal work of breathing on 2.5LO2 via [place] GI: soft, non-tender, non-distended, bowel sounds present x4, no hepatomegaly, no splenomegaly, no masses Ext: Warm, well-perfused, no edema. MSK: Uses cane for ambulation.  Assessment/ Plan: 62 y.o. female   1. Acute on chronic congestive heart failure, unspecified heart failure type [facility]) Patient is nontoxic-appearing.  She has no evidence of fluid overload on  exam.  We will plan to check CMP and CBC.  Currently using 2-1/2 L of oxygen via nasal cannula and saturating 93%.  No increased work of breathing on today's exam.  I strongly recommended that she follow-up with cardiology as scheduled.  She does cite that her lack of insurance is a limiting factor.  She was given the [employer] financial assistance application today.  I recommended that she get this done ASAP. - CMP14+EGFR - CBC with Differential  2. Hospital discharge follow-up - CMP14+EGFR - CBC with Differential  3. Non-intractable vomiting with nausea, unspecified vomiting type Difficult to tell why she is having nausea and vomiting.  She has this history of possible elevation in LFTs.  We will check this again today.  We will check renal function.  Her abdominal exam was unremarkable.  Could consider trial of PPI to see if this would help.  Will await lab results before prescribing.  Referred to gastroenterology. - CMP14+EGFR - CBC with Differential - Ambulatory referral to Gastroenterology   Orders Placed This Encounter  Procedures  . CMP14+EGFR  . CBC with Differential  . Ambulatory referral to Gastroenterology    Referral Priority:   Routine    Referral Type:   Consultation    Referral Reason:   Specialty Services Required    Number of Visits Requested:   [place], [place] [phone]

## 2017-06-11 NOTE — Patient Instructions (Signed)
I value your feedback and appreciate you entrusting [place] with your care.  If you get a survey, I would appreciate your taking the time to let us know what your experience was like.  You had labs performed today.  You will be contacted with the results of the labs once they are available, usually in the next 3 days for routine lab work.  Fill out the paperwork for financial aid and return a handicap placard form to our office and I will complete this.  I have referred her to gastroenterology today.  Nausea and Vomiting, Adult Feeling sick to your stomach (nausea) means that your stomach is upset or you feel like you have to throw up (vomit). Feeling more and more sick to your stomach can lead to throwing up. Throwing up happens when food and liquid from your stomach are thrown up and out the mouth. Throwing up can make you feel weak and cause you to get dehydrated. Dehydration can make you tired and thirsty, make you have a dry mouth, and make it so you pee (urinate) less often. Older adults and people with other diseases or a weak defense system (immune system) are at higher risk for dehydration. If you feel sick to your stomach or if you throw up, it is important to follow instructions from your doctor about how to take care of yourself. Follow these instructions at home: Eating and drinking Follow these instructions as told by your doctor:  Take an oral rehydration solution (ORS). This is a drink that is sold at pharmacies and stores.  Drink clear fluids in small amounts as you are able, such as: ? Water. ? Ice chips. ? Diluted fruit juice. ? Low-calorie sports drinks.  Eat bland, easy-to-digest foods in small amounts as you are able, such as: ? Bananas. ? Applesauce. ? Rice. ? Low-fat (lean) meats. ? Toast. ? Crackers.  Avoid fluids that have a lot of sugar or caffeine in them.  Avoid alcohol.  Avoid spicy or fatty foods.  General instructions  Drink enough fluid to keep your pee  (urine) clear or pale yellow.  Wash your hands often. If you cannot use soap and water, use hand sanitizer.  Make sure that all people in your home wash their hands well and often.  Take over-the-counter and prescription medicines only as told by your doctor.  Rest at home while you get better.  Watch your condition for any changes.  Breathe slowly and deeply when you feel sick to your stomach.  Keep all follow-up visits as told by your doctor. This is important. Contact a doctor if:  You have a fever.  You cannot keep fluids down.  Your symptoms get worse.  You have new symptoms.  You feel sick to your stomach for more than two days.  You feel light-headed or dizzy.  You have a headache.  You have muscle cramps. Get help right away if:  You have pain in your chest, neck, arm, or jaw.  You feel very weak or you pass out (faint).  You throw up again and again.  You see blood in your throw-up.  Your throw-up looks like black coffee grounds.  You have bloody or black poop (stools) or poop that look like tar.  You have a very bad headache, a stiff neck, or both.  You have a rash.  You have very bad pain, cramping, or bloating in your belly (abdomen).  You have trouble breathing.  You are breathing very quickly.  Your heart is beating very quickly.  Your skin feels cold and clammy.  You feel confused.  You have pain when you pee.  You have signs of dehydration, such as: ? Dark pee, hardly any pee, or no pee. ? Cracked lips. ? Dry mouth. ? Sunken eyes. ? Sleepiness. ? Weakness. These symptoms may be an emergency. Do not wait to see if the symptoms will go away. Get medical help right away. Call your local emergency services (911 in the [place] Do not drive yourself to the hospital. This information is not intended to replace advice given to you by your health care provider. Make sure you discuss any questions you have with your health care  provider. Document Released: 11/06/2007 Document Revised: 12/08/2015 Document Reviewed: 01/24/2015 Elsevier Interactive Patient Education  2018 [employer].

## 2017-06-12 LAB — CMP14+EGFR
A/G RATIO: 1.7 (ref 1.2–2.2)
ALT: 33 IU/L — ABNORMAL HIGH (ref 0–32)
AST: 23 IU/L (ref 0–40)
Albumin: 3.9 g/dL (ref 3.6–4.8)
Alkaline Phosphatase: 75 IU/L (ref 39–117)
BILIRUBIN TOTAL: 0.4 mg/dL (ref 0.0–1.2)
BUN/Creatinine Ratio: 13 (ref 12–28)
BUN: 14 mg/dL (ref 8–27)
CALCIUM: 8.7 mg/dL (ref 8.7–10.3)
CHLORIDE: 91 mmol/L — AB (ref 96–106)
CO2: 33 mmol/L — ABNORMAL HIGH (ref 20–29)
Creatinine, Ser: 1.08 mg/dL — ABNORMAL HIGH (ref 0.57–1.00)
GFR calc Af Amer: 64 mL/min/{1.73_m2} (ref >59)
GFR, EST NON AFRICAN AMERICAN: 56 mL/min/{1.73_m2} — AB (ref >59)
Globulin, Total: 2.3 g/dL (ref 1.5–4.5)
Glucose: 271 mg/dL — ABNORMAL HIGH (ref 65–99)
POTASSIUM: 4.2 mmol/L (ref 3.5–5.2)
Sodium: 144 mmol/L (ref 134–144)
Total Protein: 6.2 g/dL (ref 6.0–8.5)

## 2017-06-12 LAB — CBC WITH DIFFERENTIAL/PLATELET
BASOS ABS: 0 10*3/uL (ref 0.0–0.2)
Basos: 0 %
EOS (ABSOLUTE): 0.4 10*3/uL (ref 0.0–0.4)
Eos: 4 %
Hematocrit: 40.6 % (ref 34.0–46.6)
Hemoglobin: 13.6 g/dL (ref 11.1–15.9)
IMMATURE GRANS (ABS): 0.1 10*3/uL (ref 0.0–0.1)
IMMATURE GRANULOCYTES: 1 %
LYMPHS: 31 %
Lymphocytes Absolute: 3.2 10*3/uL — ABNORMAL HIGH (ref 0.7–3.1)
MCH: 30.5 pg (ref 26.6–33.0)
MCHC: 33.5 g/dL (ref 31.5–35.7)
MCV: 91 fL (ref 79–97)
Monocytes Absolute: 0.9 10*3/uL (ref 0.1–0.9)
Monocytes: 9 %
Neutrophils Absolute: 5.7 10*3/uL (ref 1.4–7.0)
Neutrophils: 55 %
PLATELETS: 214 10*3/uL (ref 150–379)
RBC: 4.46 x10E6/uL (ref 3.77–5.28)
RDW: 13.3 % (ref 12.3–15.4)
WBC: 10.3 10*3/uL (ref 3.4–10.8)

## 2017-06-16 ENCOUNTER — Encounter: Payer: Self-pay | Admitting: Gastroenterology

## 2017-06-17 ENCOUNTER — Ambulatory Visit: Payer: Self-pay | Admitting: Nurse Practitioner

## 2017-06-20 ENCOUNTER — Other Ambulatory Visit: Payer: Self-pay | Admitting: *Deleted

## 2017-06-20 DIAGNOSIS — E119 Type 2 diabetes mellitus without complications: Secondary | ICD-10-CM

## 2017-06-20 MED ORDER — METFORMIN HCL 1000 MG PO TABS: ORAL_TABLET | ORAL | 0 refills | Status: DC

## 2017-07-11 ENCOUNTER — Ambulatory Visit (INDEPENDENT_AMBULATORY_CARE_PROVIDER_SITE_OTHER): Payer: Self-pay | Admitting: Nurse Practitioner

## 2017-07-11 ENCOUNTER — Encounter: Payer: Self-pay | Admitting: Nurse Practitioner

## 2017-07-11 VITALS — BP 118/76 | HR 88 | Temp 97.3°F | Ht 64.0 in | Wt 204.0 lb

## 2017-07-11 DIAGNOSIS — R609 Edema, unspecified: Secondary | ICD-10-CM

## 2017-07-11 DIAGNOSIS — E782 Mixed hyperlipidemia: Secondary | ICD-10-CM

## 2017-07-11 DIAGNOSIS — I509 Heart failure, unspecified: Secondary | ICD-10-CM

## 2017-07-11 DIAGNOSIS — F411 Generalized anxiety disorder: Secondary | ICD-10-CM

## 2017-07-11 DIAGNOSIS — E876 Hypokalemia: Secondary | ICD-10-CM

## 2017-07-11 DIAGNOSIS — I159 Secondary hypertension, unspecified: Secondary | ICD-10-CM

## 2017-07-11 DIAGNOSIS — E559 Vitamin D deficiency, unspecified: Secondary | ICD-10-CM

## 2017-07-11 DIAGNOSIS — J411 Mucopurulent chronic bronchitis: Secondary | ICD-10-CM

## 2017-07-11 DIAGNOSIS — E119 Type 2 diabetes mellitus without complications: Secondary | ICD-10-CM

## 2017-07-11 DIAGNOSIS — Z6838 Body mass index (BMI) 38.0-38.9, adult: Secondary | ICD-10-CM

## 2017-07-11 DIAGNOSIS — E785 Hyperlipidemia, unspecified: Secondary | ICD-10-CM

## 2017-07-11 LAB — BAYER DCA HB A1C WAIVED: HB A1C: 6.5 % (ref <7.0)

## 2017-07-11 MED ORDER — FLUTICASONE FUROATE-VILANTEROL 100-25 MCG/INH IN AEPB: INHALATION_SPRAY | RESPIRATORY_TRACT | 5 refills | Status: DC

## 2017-07-11 MED ORDER — GLIMEPIRIDE 4 MG PO TABS: 4.0000 mg | ORAL_TABLET | Freq: Every day | ORAL | 4 refills | Status: DC

## 2017-07-11 MED ORDER — METFORMIN HCL 1000 MG PO TABS
ORAL_TABLET | ORAL | 1 refills | Status: DC
Start: 2017-07-11 — End: 2017-10-14

## 2017-07-11 MED ORDER — ATORVASTATIN CALCIUM 40 MG PO TABS
ORAL_TABLET | ORAL | 5 refills | Status: DC
Start: 2017-07-11 — End: 2017-10-14

## 2017-07-11 MED ORDER — FUROSEMIDE 40 MG PO TABS
ORAL_TABLET | ORAL | 5 refills | Status: DC
Start: 2017-07-11 — End: 2017-10-14

## 2017-07-11 MED ORDER — DULAGLUTIDE 1.5 MG/0.5ML ~~LOC~~ SOAJ: 1.5000 mg | SUBCUTANEOUS | 5 refills | Status: DC

## 2017-07-11 MED ORDER — CARVEDILOL 3.125 MG PO TABS: 3.1250 mg | ORAL_TABLET | Freq: Two times a day (BID) | ORAL | 1 refills | Status: DC

## 2017-07-11 NOTE — Progress Notes (Signed)
Subjective:    Patient ID: Annette Gibson, female    DOB: November 15, 1955, 62 y.o.   MRN: [id number]  HPI   Annette Gibson is here today for follow up of chronic medical problem.  Outpatient Encounter Medications as of 07/11/2017  Medication Sig  . albuterol (PROVENTIL HFA;VENTOLIN HFA) 108 (90 Base) MCG/ACT inhaler Inhale 1 puff into the lungs every 6 (six) hours as needed for wheezing or shortness of breath.  Marland Kitchen albuterol (PROVENTIL) (2.5 MG/3ML) 0.083% nebulizer solution Take 3 mLs (2.5 mg total) by nebulization every 6 (six) hours as needed for wheezing or shortness of breath.  . Apple Cider Vinegar 500 MG TABS Take 500 mg by mouth 2 (two) times daily.  Marland Kitchen atorvastatin (LIPITOR) 40 MG tablet Take 1 tablet (40 mg total) by mouth daily at 6 PM. (Patient taking differently: Take 40 mg by mouth daily at 6 PM. )  . Biotin w/ Vitamins C & E (HAIR SKIN & NAILS GUMMIES) 1250-7.5-7.5 MCG-MG-UNT CHEW Chew 1 tablet by mouth daily.  . carvedilol (COREG) 3.125 MG tablet Take 1 tablet (3.125 mg total) by mouth 2 (two) times daily with a meal.  . cholecalciferol (VITAMIN D) 1000 units tablet Take 1,000 Units by mouth daily.  . Cyanocobalamin (VITAMIN B-12 PO) Take 3,000 mcg by mouth daily.  . Dulaglutide (TRULICITY) 1.5 NI/6.2VO SOPN Inject 1.5 mg into the skin once a week.  . fenofibrate micronized (LOFIBRA) 134 MG capsule Take 1 capsule (134 mg total) by mouth daily before breakfast.  . fluticasone furoate-vilanterol (BREO ELLIPTA) 100-25 MCG/INH AEPB INHALE 1 PUFF INTO LUNGS DAILY AS DIRECTED (Patient taking differently: Inhale 1 puff into the lungs daily. )  . furosemide (LASIX) 40 MG tablet 1 tablet 40 mg every morning and 1/2 tablet (20 mg) every evening  . glimepiride (AMARYL) 4 MG tablet Take 1 tablet (4 mg total) by mouth daily before breakfast.  . metFORMIN (GLUCOPHAGE) 1000 MG tablet TAKE 1 TABLET BY MOUTH TWICE DAILY WITH A MEAL  . Multiple Vitamin (MULTIVITAMIN WITH MINERALS) TABS tablet  Take 1 tablet by mouth daily.     1. Secondary hypertension  No c/o chest pain or headache recently. Does not check blood pressure at home. BP Readings from Last 3 Encounters:  06/11/17 127/75  05/28/17 (!) 131/57  05/23/17 138/78     2. Acute on chronic congestive heart failure, unspecified heart failure type ([place]) Has some lower ext edema. Was in hospital in December with CHF and resp faiure  3. Mucopurulent chronic bronchitis ([place])  Is doing much better. SHe was admitted to hospital in December for acute resp failure. She is doing much better. Michela Pitcher they were really concerned about her while in hospital. Was discharged home on oxygen. Is still on oxygen at home.  4. Type 2 diabetes mellitus without complication, without long-term current use of insulin ([place]) last hgba1c was 7.9%. Blood sugars have been up and down. No hypoglycemia. Currently has stopped turlicity because ecannot afford it.  5. Hyperlipidemia, unspecified hyperlipidemia type  Does not watch diet.  6. Peripheral edema  Lasix working well  To keep this under control. Patient weighs herself daily  7. GAD (generalized anxiety disorder)  Has been very stressed since being in the hospital  8. Vitamin D deficiency  Has not been taking  9. BMI 38.0-38.9,adult  Lost a few pounds whole in hospital  10. Hypokalemia  No c/o lower ext cramping    New complaints: Doing much better today- still fatigued  butis improving  Social history: Daughter and granddaughter have moved in with her. Had they not been there when she got sick she would have died at home.   Review of Systems  Constitutional: Negative for activity change and appetite change.  HENT: Negative.   Eyes: Negative for pain.  Respiratory: Positive for shortness of breath.   Cardiovascular: Negative for chest pain, palpitations and leg swelling.  Gastrointestinal: Negative for abdominal pain.  Endocrine: Negative for polydipsia.  Genitourinary: Negative.     Skin: Negative for rash.  Neurological: Negative for dizziness, weakness and headaches.  Hematological: Does not bruise/bleed easily.  Psychiatric/Behavioral: Negative.   All other systems reviewed and are negative.      Objective:   Physical Exam  Constitutional: She is oriented to person, place, and time. She appears well-developed and well-nourished.  HENT:  Nose: Nose normal.  Mouth/Throat: Oropharynx is clear and moist.  Eyes: EOM are normal.  Neck: Trachea normal, normal range of motion and full passive range of motion without pain. Neck supple. No JVD present. Carotid bruit is not present. No thyromegaly present.  Cardiovascular: Normal rate, regular rhythm, normal heart sounds and intact distal pulses. Exam reveals no gallop and no friction rub.  No murmur heard. Pulmonary/Chest: [place] normal and breath sounds normal.  Abdominal: Soft. Bowel sounds are normal. She exhibits no distension and no mass. There is no tenderness.  Musculoskeletal: Normal range of motion.  Lymphadenopathy:    She has no cervical adenopathy.  Neurological: She is alert and oriented to person, place, and time. She has normal reflexes.  Skin: Skin is warm and dry.  Psychiatric: She has a normal mood and affect. Her behavior is normal. Judgment and thought content normal.   BP 118/76   Pulse 88   Temp (!) 97.3 F (36.3 C) (Oral)   Ht _0  (1.626 m)   Wt 204 lb (92.5 kg)   BMI 35.02 kg/m   hgba1c 6.5%    Assessment & Plan:  1. Secondary hypertension Low sodium diet - CMP14+EGFR  2. Acute on chronic congestive heart failure, unspecified heart failure type ([facility]) Limit fluid consumption Keep follow up with cardiology  3. Mucopurulent chronic bronchitis ([place]) Need to stop smoking - fluticasone furoate-vilanterol (BREO ELLIPTA) 100-25 MCG/INH AEPB; INHALE 1 PUFF INTO LUNGS DAILY AS DIRECTED  Dispense: 60 each; Refill: 5  4. Type 2 diabetes mellitus without complication, without long-term  current use of insulin ([facility]) Continue to watch carbs in diet - metFORMIN (GLUCOPHAGE) 1000 MG tablet; TAKE 1 TABLET BY MOUTH TWICE DAILY WITH A MEAL  Dispense: 180 tablet; Refill: 1 - Dulaglutide (TRULICITY) 1.5 YI/9.4WN SOPN; Inject 1.5 mg into the skin once a week. (Patient not taking: Reported on 07/11/2017)  Dispense: 6 pen; Refill: 5 - glimepiride (AMARYL) 4 MG tablet; Take 1 tablet (4 mg total) by mouth daily before breakfast.  Dispense: 30 tablet; Refill: 4 - Bayer DCA Hb A1c Waived  5.  Mixed hyperlipidemia Low fat diet - atorvastatin (LIPITOR) 40 MG tablet; Take 1 tablet (40 mg total) by mouth daily at 6 PM.  Dispense: 30 tablet; Refill: 5  6. Peripheral edema elevtae legs when sitting - furosemide (LASIX) 40 MG tablet; 1 tablet 40 mg every morning and 1/2 tablet (20 mg) every evening  Dispense: 30 tablet; Refill: 5  7. GAD (generalized anxiety disorder) Stress management  8. Vitamin D deficiency  9. BMI 38.0-38.9,adult Discussed diet and exercise for person with BMI >25 Will recheck weight in 3-6  months  10. Hypokalemia    Labs pending Health maintenance reviewed Diet and exercise encouraged Continue all meds Follow up  In 3 months   [place], FNP

## 2017-07-11 NOTE — Patient Instructions (Signed)
Fall Prevention in the Home °Falls can cause injuries. They can happen to people of all ages. There are many things you can do to make your home safe and to help prevent falls. °What can I do on the outside of my home? °· Regularly fix the edges of walkways and driveways and fix any cracks. °· Remove anything that might make you trip as you walk through a door, such as a raised step or threshold. °· Trim any bushes or trees on the path to your home. °· Use bright outdoor lighting. °· Clear any walking paths of anything that might make someone trip, such as rocks or tools. °· Regularly check to see if handrails are loose or broken. Make sure that both sides of any steps have handrails. °· Any raised decks and porches should have guardrails on the edges. °· Have any leaves, snow, or ice cleared regularly. °· Use sand or salt on walking paths during winter. °· Clean up any spills in your garage right away. This includes oil or grease spills. °What can I do in the bathroom? °· Use night lights. °· Install grab bars by the toilet and in the tub and shower. Do not use towel bars as grab bars. °· Use non-skid mats or decals in the tub or shower. °· If you need to sit down in the shower, use a plastic, non-slip stool. °· Keep the floor dry. Clean up any water that spills on the floor as soon as it happens. °· Remove soap buildup in the tub or shower regularly. °· Attach bath mats securely with double-sided non-slip rug tape. °· Do not have throw rugs and other things on the floor that can make you trip. °What can I do in the bedroom? °· Use night lights. °· Make sure that you have a light by your bed that is easy to reach. °· Do not use any sheets or blankets that are too big for your bed. They should not hang down onto the floor. °· Have a firm chair that has side arms. You can use this for support while you get dressed. °· Do not have throw rugs and other things on the floor that can make you trip. °What can I do in the  kitchen? °· Clean up any spills right away. °· Avoid walking on wet floors. °· Keep items that you use a lot in easy-to-reach places. °· If you need to reach something above you, use a strong step stool that has a grab bar. °· Keep electrical cords out of the way. °· Do not use floor polish or wax that makes floors slippery. If you must use wax, use non-skid floor wax. °· Do not have throw rugs and other things on the floor that can make you trip. °What can I do with my stairs? °· Do not leave any items on the stairs. °· Make sure that there are handrails on both sides of the stairs and use them. Fix handrails that are broken or loose. Make sure that handrails are as long as the stairways. °· Check any carpeting to make sure that it is firmly attached to the stairs. Fix any carpet that is loose or worn. °· Avoid having throw rugs at the top or bottom of the stairs. If you do have throw rugs, attach them to the floor with carpet tape. °· Make sure that you have a light switch at the top of the stairs and the bottom of the stairs. If you do   not have them, ask someone to add them for you. What else can I do to help prevent falls?  Wear shoes that: ? Do not have high heels. ? Have rubber bottoms. ? Are comfortable and fit you well. ? Are closed at the toe. Do not wear sandals.  If you use a stepladder: ? Make sure that it is fully opened. Do not climb a closed stepladder. ? Make sure that both sides of the stepladder are locked into place. ? Ask someone to hold it for you, if possible.  Clearly mark and make sure that you can see: ? Any grab bars or handrails. ? First and last steps. ? Where the edge of each step is.  Use tools that help you move around (mobility aids) if they are needed. These include: ? Canes. ? Walkers. ? Scooters. ? Crutches.  Turn on the lights when you go into a dark area. Replace any light bulbs as soon as they burn out.  Set up your furniture so you have a clear path.  Avoid moving your furniture around.  If any of your floors are uneven, fix them.  If there are any pets around you, be aware of where they are.  Review your medicines with your doctor. Some medicines can make you feel dizzy. This can increase your chance of falling. Ask your doctor what other things that you can do to help prevent falls. This information is not intended to replace advice given to you by your health care provider. Make sure you discuss any questions you have with your health care provider. Document Released: 03/16/2009 Document Revised: 10/26/2015 Document Reviewed: 06/24/2014 Elsevier Interactive Patient Education  [employer].

## 2017-07-12 LAB — CMP14+EGFR
A/G RATIO: 1.7 (ref 1.2–2.2)
ALBUMIN: 4 g/dL (ref 3.6–4.8)
ALK PHOS: 72 IU/L (ref 39–117)
ALT: 20 IU/L (ref 0–32)
AST: 18 IU/L (ref 0–40)
BILIRUBIN TOTAL: 0.3 mg/dL (ref 0.0–1.2)
BUN / CREAT RATIO: 18 (ref 12–28)
BUN: 12 mg/dL (ref 8–27)
CO2: 25 mmol/L (ref 20–29)
Calcium: 9.2 mg/dL (ref 8.7–10.3)
Chloride: 100 mmol/L (ref 96–106)
Creatinine, Ser: 0.66 mg/dL (ref 0.57–1.00)
GFR calc non Af Amer: 96 mL/min/{1.73_m2} (ref >59)
GFR, EST AFRICAN AMERICAN: 110 mL/min/{1.73_m2} (ref >59)
Globulin, Total: 2.3 g/dL (ref 1.5–4.5)
Glucose: 214 mg/dL — ABNORMAL HIGH (ref 65–99)
POTASSIUM: 4.2 mmol/L (ref 3.5–5.2)
Sodium: 143 mmol/L (ref 134–144)
Total Protein: 6.3 g/dL (ref 6.0–8.5)

## 2017-07-17 ENCOUNTER — Ambulatory Visit: Payer: Self-pay | Admitting: Gastroenterology

## 2017-09-13 ENCOUNTER — Other Ambulatory Visit: Payer: Self-pay | Admitting: Nurse Practitioner

## 2017-10-14 ENCOUNTER — Ambulatory Visit (INDEPENDENT_AMBULATORY_CARE_PROVIDER_SITE_OTHER): Payer: Self-pay | Admitting: Nurse Practitioner

## 2017-10-14 ENCOUNTER — Encounter: Payer: Self-pay | Admitting: Nurse Practitioner

## 2017-10-14 VITALS — BP 114/77 | HR 87 | Temp 97.6°F | Ht 64.0 in | Wt 197.0 lb

## 2017-10-14 DIAGNOSIS — E785 Hyperlipidemia, unspecified: Secondary | ICD-10-CM

## 2017-10-14 DIAGNOSIS — E782 Mixed hyperlipidemia: Secondary | ICD-10-CM

## 2017-10-14 DIAGNOSIS — I509 Heart failure, unspecified: Secondary | ICD-10-CM

## 2017-10-14 DIAGNOSIS — R609 Edema, unspecified: Secondary | ICD-10-CM

## 2017-10-14 DIAGNOSIS — E876 Hypokalemia: Secondary | ICD-10-CM

## 2017-10-14 DIAGNOSIS — I159 Secondary hypertension, unspecified: Secondary | ICD-10-CM

## 2017-10-14 DIAGNOSIS — Z6838 Body mass index (BMI) 38.0-38.9, adult: Secondary | ICD-10-CM

## 2017-10-14 DIAGNOSIS — F411 Generalized anxiety disorder: Secondary | ICD-10-CM

## 2017-10-14 DIAGNOSIS — E119 Type 2 diabetes mellitus without complications: Secondary | ICD-10-CM

## 2017-10-14 DIAGNOSIS — J411 Mucopurulent chronic bronchitis: Secondary | ICD-10-CM

## 2017-10-14 LAB — BAYER DCA HB A1C WAIVED: HB A1C (BAYER DCA - WAIVED): 6.8 % (ref <7.0)

## 2017-10-14 MED ORDER — FUROSEMIDE 40 MG PO TABS: ORAL_TABLET | ORAL | 5 refills | Status: DC

## 2017-10-14 MED ORDER — FENOFIBRATE 145 MG PO TABS: 145.0000 mg | ORAL_TABLET | Freq: Every day | ORAL | 5 refills | Status: DC

## 2017-10-14 MED ORDER — FLUTICASONE FUROATE-VILANTEROL 100-25 MCG/INH IN AEPB
INHALATION_SPRAY | RESPIRATORY_TRACT | 5 refills | Status: DC
Start: 2017-10-14 — End: 2017-10-30

## 2017-10-14 MED ORDER — METFORMIN HCL 1000 MG PO TABS: ORAL_TABLET | ORAL | 1 refills | Status: DC

## 2017-10-14 MED ORDER — ATORVASTATIN CALCIUM 40 MG PO TABS
ORAL_TABLET | ORAL | 5 refills | Status: DC
Start: 2017-10-14 — End: 2018-01-20

## 2017-10-14 MED ORDER — GLIMEPIRIDE 4 MG PO TABS: 4.0000 mg | ORAL_TABLET | Freq: Every day | ORAL | 4 refills | Status: DC

## 2017-10-14 NOTE — Patient Instructions (Signed)
Heart Failure Heart failure is a condition in which the heart has trouble pumping blood because it has become weak or stiff. This means that the heart does not pump blood efficiently for the body to work well. For some people with heart failure, fluid may back up into the lungs and there may be swelling (edema) in the lower legs. Heart failure is usually a long-term (chronic) condition. It is important for you to take good care of yourself and follow the treatment plan from your health care provider. What are the causes? This condition is caused by some health problems, including:  High blood pressure (hypertension). Hypertension causes the heart muscle to work harder than normal. High blood pressure eventually causes the heart to become stiff and weak.  Coronary artery disease (CAD). CAD is the buildup of cholesterol and fat (plaques) in the arteries of the heart.  Heart attack (myocardial infarction). Injured tissue, which is caused by the heart attack, does not contract as well and the heart's ability to pump blood is weakened.  Abnormal heart valves. When the heart valves do not open and close properly, the heart muscle must pump harder to keep the blood flowing.  Heart muscle disease (cardiomyopathy or myocarditis). Heart muscle disease is damage to the heart muscle from a variety of causes, such as drug or alcohol abuse, infections, or unknown causes. These can increase the risk of heart failure.  Lung disease. When the lungs do not work properly, the heart must work harder.  What increases the risk? Risk of heart failure increases as a person ages. This condition is also more likely to develop in people who:  Are overweight.  Are female.  Smoke or chew tobacco.  Abuse alcohol or illegal drugs.  Have taken medicines that can damage the heart, such as chemotherapy drugs.  Have diabetes. ? High blood sugar (glucose) is associated with high fat (lipid) levels in the blood. ? Diabetes  can also damage tiny blood vessels that carry nutrients to the heart muscle.  Have abnormal heart rhythms.  Have thyroid problems.  Have low blood counts (anemia).  What are the signs or symptoms? Symptoms of this condition include:  Shortness of breath with activity, such as when climbing stairs.  Persistent cough.  Swelling of the feet, ankles, legs, or abdomen.  Unexplained weight gain.  Difficulty breathing when lying flat (orthopnea).  Waking from sleep because of the need to sit up and get more air.  Rapid heartbeat.  Fatigue and loss of energy.  Feeling light-headed, dizzy, or close to fainting.  Loss of appetite.  Nausea.  Increased urination during the night (nocturia).  Confusion.  How is this diagnosed? This condition is diagnosed based on:  Medical history, symptoms, and a physical exam.  Diagnostic tests, which may include: ? Echocardiogram. ? Electrocardiogram (ECG). ? Chest X-ray. ? Blood tests. ? Exercise stress test. ? Radionuclide scans. ? Cardiac catheterization and angiogram.  How is this treated? Treatment for this condition is aimed at managing the symptoms of heart failure. Medicines, behavioral changes, or other treatments may be necessary to treat heart failure. Medicines These may include:  Angiotensin-converting enzyme (ACE) inhibitors. This type of medicine blocks the effects of a blood protein called angiotensin-converting enzyme. ACE inhibitors relax (dilate) the blood vessels and help to lower blood pressure.  Angiotensin receptor blockers (ARBs). This type of medicine blocks the actions of a blood protein called angiotensin. ARBs dilate the blood vessels and help to lower blood pressure.  Water  pills (diuretics). Diuretics cause the kidneys to remove salt and water from the blood. The extra fluid is removed through urination, leaving a lower volume of blood that the heart has to pump.  Beta blockers. These improve heart  muscle strength and they prevent the heart from beating too quickly.  Digoxin. This increases the force of the heartbeat.  Healthy behavior changes These may include:  Reaching and maintaining a healthy weight.  Stopping smoking or chewing tobacco.  Eating heart-healthy foods.  Limiting or avoiding alcohol.  Stopping use of street drugs (illegal drugs).  Physical activity.  Other treatments These may include:  Surgery to open blocked coronary arteries or repair damaged heart valves.  Placement of a biventricular pacemaker to improve heart muscle function (cardiac resynchronization therapy). This device paces both the right ventricle and left ventricle.  Placement of a device to treat serious abnormal heart rhythms (implantable cardioverter defibrillator, or ICD).  Placement of a device to improve the pumping ability of the heart (left ventricular assist device, or LVAD).  Heart transplant. This can cure heart failure, and it is considered for certain patients who do not improve with other therapies.  Follow these instructions at home: Medicines  Take over-the-counter and prescription medicines only as told by your health care provider. Medicines are important in reducing the workload of your heart, slowing the progression of heart failure, and improving your symptoms. ? Do not stop taking your medicine unless your health care provider told you to do that. ? Do not skip any dose of medicine. ? Refill your prescriptions before you run out of medicine. You need your medicines every day. Eating and drinking   Eat heart-healthy foods. Talk with a dietitian to make an eating plan that is right for you. ? Choose foods that contain no trans fat and are low in saturated fat and cholesterol. Healthy choices include fresh or frozen fruits and vegetables, fish, lean meats, legumes, fat-free or low-fat dairy products, and whole-grain or high-fiber foods. ? Limit salt (sodium) if  directed by your health care provider. Sodium restriction may reduce symptoms of heart failure. Ask a dietitian to recommend heart-healthy seasonings. ? Use healthy cooking methods instead of frying. Healthy methods include roasting, grilling, broiling, baking, poaching, steaming, and stir-frying.  Limit your fluid intake if directed by your health care provider. Fluid restriction may reduce symptoms of heart failure. Lifestyle  Stop smoking or using chewing tobacco. Nicotine and tobacco can damage your heart and your blood vessels. Do not use nicotine gum or patches before talking to your health care provider.  Limit alcohol intake to no more than 1 drink per day for non-pregnant women and 2 drinks per day for men. One drink equals 12 oz of beer, 5 oz of wine, or 1 oz of hard liquor. ? Drinking more than that is harmful to your heart. Tell your health care provider if you drink alcohol several times a week. ? Talk with your health care provider about whether any level of alcohol use is safe for you. ? If your heart has already been damaged by alcohol or you have severe heart failure, drinking alcohol should be stopped completely.  Stop use of illegal drugs.  Lose weight if directed by your health care provider. Weight loss may reduce symptoms of heart failure.  Do moderate physical activity if directed by your health care provider. People who are elderly and people with severe heart failure should consult with a health care provider for physical activity recommendations.  Monitor important information  Weigh yourself every day. Keeping track of your weight daily helps you to notice excess fluid sooner. ? Weigh yourself every morning after you urinate and before you eat breakfast. ? Wear the same amount of clothing each time you weigh yourself. ? Record your daily weight. Provide your health care provider with your weight record.  Monitor and record your blood pressure as told by your health  care provider.  Check your pulse as told by your health care provider. Dealing with extreme temperatures  If the weather is extremely hot: ? Avoid vigorous physical activity. ? Use air conditioning or fans or seek a cooler location. ? Avoid caffeine and alcohol. ? Wear loose-fitting, lightweight, and light-colored clothing.  If the weather is extremely cold: ? Avoid vigorous physical activity. ? Layer your clothes. ? Wear mittens or gloves, a hat, and a scarf when you go outside. ? Avoid alcohol. General instructions  Manage other health conditions such as hypertension, diabetes, thyroid disease, or abnormal heart rhythms as told by your health care provider.  Learn to manage stress. If you need help to do this, ask your health care provider.  Plan rest periods when fatigued.  Get ongoing education and support as needed.  Participate in or seek rehabilitation as needed to maintain or improve independence and quality of life.  Stay up to date with immunizations. Keeping current on pneumococcal and influenza immunizations is especially important to prevent respiratory infections.  Keep all follow-up visits as told by your health care provider. This is important. Contact a health care provider if:  You have a rapid weight gain.  You have increasing shortness of breath that is unusual for you.  You are unable to participate in your usual physical activities.  You tire easily.  You cough more than normal, especially with physical activity.  You have any swelling or more swelling in areas such as your hands, feet, ankles, or abdomen.  You are unable to sleep because it is hard to breathe.  You feel like your heart is beating quickly (palpitations).  You become dizzy or light-headed when you stand up. Get help right away if:  You have difficulty breathing.  You notice or your family notices a change in your awareness, such as having trouble staying awake or having  difficulty with concentration.  You have pain or discomfort in your chest.  You have an episode of fainting (syncope). This information is not intended to replace advice given to you by your health care provider. Make sure you discuss any questions you have with your health care provider. Document Released: 05/20/2005 Document Revised: 01/23/2016 Document Reviewed: 12/13/2015 Elsevier Interactive Patient Education  [employer].

## 2017-10-14 NOTE — Progress Notes (Signed)
Subjective:    Patient ID: Annette Gibson, female    DOB: 1955/10/11, 62 y.o.   MRN: [id number]   Chief Complaint: Medical Management of Chronic Issues  HPI:  1. Secondary hypertension  No recent chest pain, increasing sob or headaches. Does not check blood pressure at home. BP Readings from Last 3 Encounters:  07/11/17 118/76  06/11/17 127/75  05/28/17 (!) 131/57     2. Type 2 diabetes mellitus without complication, without long-term current use of insulin (HCC) last HGBA1c was 6.5%. Checks bllod sugars every couple of days. Running around 130-140. No hypoglycemia that she is aware of.  3. Peripheral edema  Has slight edema of lower ext daily but usually resolves during the night.  4. Hypokalemia  No c/o lower ext cramping- will check labs today  5. Hyperlipidemia, unspecified hyperlipidemia type  Has poor appetite. Not really watching diet very closely  6. GAD (generalized anxiety disorder)  Has been managing without medication.  7. BMI 38.0-38.9,adult  Weight is down about 7 lbs since last visit  8. Mucopurulent chronic bronchitis ([place])  Patient started smoking again. Says she just does not want to quit  9. Acute on chronic congestive heart failure, unspecified heart failure type [facility]) Was admitted to hospital in resp failure due to CHF and COPD in December. Also had similar admission earlier in December with resp failure due to flu. She is doing much better. Fatigues easily and has some dyspnea.    Outpatient Encounter Medications as of 10/14/2017  Medication Sig  . albuterol (PROVENTIL HFA;VENTOLIN HFA) 108 (90 Base) MCG/ACT inhaler Inhale 1 puff into the lungs every 6 (six) hours as needed for wheezing or shortness of breath.  Marland Kitchen albuterol (PROVENTIL) (2.5 MG/3ML) 0.083% nebulizer solution Take 3 mLs (2.5 mg total) by nebulization every 6 (six) hours as needed for wheezing or shortness of breath.  . Apple Cider Vinegar 500 MG TABS Take 500 mg by mouth 2 (two) times  daily.  Marland Kitchen atorvastatin (LIPITOR) 40 MG tablet Take 1 tablet (40 mg total) by mouth daily at 6 PM.  . Biotin w/ Vitamins C & E (HAIR SKIN & NAILS GUMMIES) 1250-7.5-7.5 MCG-MG-UNT CHEW Chew 1 tablet by mouth daily.  . carvedilol (COREG) 3.125 MG tablet TAKE 1 TABLET BY MOUTH TWICE DAILY WITH MEALS  . cholecalciferol (VITAMIN D) 1000 units tablet Take 1,000 Units by mouth daily.  . Cyanocobalamin (VITAMIN B-12 PO) Take 3,000 mcg by mouth daily.  . Dulaglutide (TRULICITY) 1.5 OJ/5.0KX SOPN Inject 1.5 mg into the skin once a week. (Patient not taking: Reported on 07/11/2017)  . fenofibrate micronized (LOFIBRA) 134 MG capsule Take 1 capsule (134 mg total) by mouth daily before breakfast.  . fluticasone furoate-vilanterol (BREO ELLIPTA) 100-25 MCG/INH AEPB INHALE 1 PUFF INTO LUNGS DAILY AS DIRECTED  . furosemide (LASIX) 40 MG tablet 1 tablet 40 mg every morning and 1/2 tablet (20 mg) every evening  . glimepiride (AMARYL) 4 MG tablet Take 1 tablet (4 mg total) by mouth daily before breakfast.  . metFORMIN (GLUCOPHAGE) 1000 MG tablet TAKE 1 TABLET BY MOUTH TWICE DAILY WITH A MEAL       New complaints: She says she is depressed. Has a lot goingon in personal life that she did  Not want to elaborate on. Depression screen [facility] 2/9 10/14/2017 07/11/2017 06/11/2017  Decreased Interest 1 0 0  Down, Depressed, Hopeless 1 0 2  PHQ - 2 Score 2 0 2  Altered sleeping 0 - 1  Tired,  decreased energy 2 - 2  Change in appetite 1 - 3  Feeling bad or failure about yourself  0 - 1  Trouble concentrating 0 - 0  Moving slowly or fidgety/restless - - 0  Suicidal thoughts - - 0  PHQ-9 Score 5 - 9     Social history: She is currently living with daughter    Review of Systems  Constitutional: Negative for activity change and appetite change.  HENT: Negative.   Eyes: Negative for pain.  Respiratory: Negative for shortness of breath.   Cardiovascular: Negative for chest pain, palpitations and leg swelling.    Gastrointestinal: Negative for abdominal pain.  Endocrine: Negative for polydipsia.  Genitourinary: Negative.   Skin: Negative for rash.  Neurological: Negative for dizziness, weakness and headaches.  Hematological: Does not bruise/bleed easily.  Psychiatric/Behavioral: Negative.   All other systems reviewed and are negative.      Objective:   Physical Exam  Constitutional: She is oriented to person, place, and time.  HENT:  Head: Normocephalic.  Nose: Nose normal.  Mouth/Throat: Oropharynx is clear and moist.  Eyes: Pupils are equal, round, and reactive to light. EOM are normal.  Neck: Normal range of motion. Neck supple. No JVD present. Carotid bruit is not present.  Cardiovascular: Normal rate, regular rhythm, normal heart sounds and intact distal pulses.  Pulmonary/Chest: Effort normal and breath sounds normal. No respiratory distress. She has no wheezes. She has no rales. She exhibits no tenderness.  Abdominal: Soft. Normal appearance, normal aorta and bowel sounds are normal. She exhibits no distension, no abdominal bruit, no pulsatile midline mass and no mass. There is no splenomegaly or hepatomegaly. There is no tenderness.  Musculoskeletal: Normal range of motion. She exhibits edema (1+ bil lower ext).  Lymphadenopathy:    She has no cervical adenopathy.  Neurological: She is alert and oriented to person, place, and time. She has normal reflexes.  Skin: Skin is warm and dry.  Psychiatric: Judgment normal.   BP 114/77   Pulse 87   Temp 97.6 F (36.4 C) (Oral)   Ht _0  (1.626 m)   Wt 197 lb (89.4 kg)   BMI 33.81 kg/m   HGBA1c 6.8%      Assessment & Plan:  Annette Celeste L. Neidhardt comes in today with chief complaint of Medical Management of Chronic Issues   Diagnosis and orders addressed:  1. Secondary hypertension Low sodium diet - CMP14+EGFR  2. Type 2 diabetes mellitus without complication, without long-term current use of insulin (HCC) Continue to watch  carbs in diet - Bayer DCA Hb A1c Waived - glimepiride (AMARYL) 4 MG tablet; Take 1 tablet (4 mg total) by mouth daily before breakfast.  Dispense: 30 tablet; Refill: 4 - metFORMIN (GLUCOPHAGE) 1000 MG tablet; TAKE 1 TABLET BY MOUTH TWICE DAILY WITH A MEAL  Dispense: 180 tablet; Refill: 1  3. Peripheral edema Elevate legs when sitting Limit fluid intake - furosemide (LASIX) 40 MG tablet; 1 tablet 40 mg every morning and 1/2 tablet (20 mg) every evening  Dispense: 30 tablet; Refill: 5  4. Hypokalemia Labs pending  5. Hyperlipidemia, unspecified hyperlipidemia type Low fat diet - Lipid panel  6. GAD (generalized anxiety disorder) Stress amangeement  7. BMI 38.0-38.9,adult Discussed diet and exercise for person with BMI >25 Will recheck weight in 3-6 months  8. Mucopurulent chronic bronchitis ([place]) Really need to stop smoking - fluticasone furoate-vilanterol (BREO ELLIPTA) 100-25 MCG/INH AEPB; INHALE 1 PUFF INTO LUNGS DAILY AS DIRECTED  Dispense: 60 each;  Refill: 5  9. Acute on chronic congestive heart failure, unspecified heart failure type (HCC) Limit fluid intake  10. Mixed hyperlipidemia Low fat diet - atorvastatin (LIPITOR) 40 MG tablet; Take 1 tablet (40 mg total) by mouth daily at 6 PM.  Dispense: 30 tablet; Refill: 5   Labs pending Health Maintenance reviewed Diet and exercise encouraged  Follow up plan: 3 months   Mary-Margaret Hassell Done, FNP

## 2017-10-15 LAB — CMP14+EGFR
ALBUMIN: 4.3 g/dL (ref 3.6–4.8)
ALK PHOS: 57 IU/L (ref 39–117)
ALT: 20 IU/L (ref 0–32)
AST: 17 IU/L (ref 0–40)
Albumin/Globulin Ratio: 1.9 (ref 1.2–2.2)
BUN / CREAT RATIO: 15 (ref 12–28)
BUN: 15 mg/dL (ref 8–27)
Bilirubin Total: 0.2 mg/dL (ref 0.0–1.2)
CHLORIDE: 96 mmol/L (ref 96–106)
CO2: 31 mmol/L — ABNORMAL HIGH (ref 20–29)
CREATININE: 0.99 mg/dL (ref 0.57–1.00)
Calcium: 9.7 mg/dL (ref 8.7–10.3)
GFR calc non Af Amer: 62 mL/min/{1.73_m2} (ref >59)
GFR, EST AFRICAN AMERICAN: 71 mL/min/{1.73_m2} (ref >59)
GLOBULIN, TOTAL: 2.3 g/dL (ref 1.5–4.5)
Glucose: 135 mg/dL — ABNORMAL HIGH (ref 65–99)
Potassium: 4.8 mmol/L (ref 3.5–5.2)
Sodium: 144 mmol/L (ref 134–144)
TOTAL PROTEIN: 6.6 g/dL (ref 6.0–8.5)

## 2017-10-15 LAB — LIPID PANEL
CHOLESTEROL TOTAL: 137 mg/dL (ref 100–199)
Chol/HDL Ratio: 3.3 ratio (ref 0.0–4.4)
HDL: 41 mg/dL (ref >39)
LDL CALC: 56 mg/dL (ref 0–99)
Triglycerides: 202 mg/dL — ABNORMAL HIGH (ref 0–149)
VLDL Cholesterol Cal: 40 mg/dL (ref 5–40)

## 2017-10-23 ENCOUNTER — Other Ambulatory Visit: Payer: Self-pay | Admitting: Nurse Practitioner

## 2017-10-23 MED ORDER — CARVEDILOL 3.125 MG PO TABS: 3.1250 mg | ORAL_TABLET | Freq: Two times a day (BID) | ORAL | 1 refills | Status: DC

## 2017-10-23 NOTE — Telephone Encounter (Signed)
RX sent to [facility] per MMM

## 2017-10-30 ENCOUNTER — Telehealth: Payer: Self-pay

## 2017-10-30 MED ORDER — BUDESONIDE-FORMOTEROL FUMARATE 80-4.5 MCG/ACT IN AERO: 2.0000 | INHALATION_SPRAY | Freq: Two times a day (BID) | RESPIRATORY_TRACT | 3 refills | Status: DC

## 2017-10-30 NOTE — Telephone Encounter (Signed)
Medicaid non preferred Breo Ellipta  Preferred are Advair Diskus, Dulera inahler and Symbicort inhaler

## 2018-01-20 ENCOUNTER — Other Ambulatory Visit: Payer: Self-pay | Admitting: *Deleted

## 2018-01-20 ENCOUNTER — Ambulatory Visit: Payer: Medicaid Other | Admitting: Nurse Practitioner

## 2018-01-20 ENCOUNTER — Telehealth: Payer: Self-pay | Admitting: Nurse Practitioner

## 2018-01-20 ENCOUNTER — Encounter: Payer: Self-pay | Admitting: Nurse Practitioner

## 2018-01-20 VITALS — BP 118/71 | HR 94 | Temp 97.6°F | Ht 64.0 in | Wt 197.0 lb

## 2018-01-20 DIAGNOSIS — J411 Mucopurulent chronic bronchitis: Secondary | ICD-10-CM | POA: Diagnosis not present

## 2018-01-20 DIAGNOSIS — Z1211 Encounter for screening for malignant neoplasm of colon: Secondary | ICD-10-CM

## 2018-01-20 DIAGNOSIS — E782 Mixed hyperlipidemia: Secondary | ICD-10-CM

## 2018-01-20 DIAGNOSIS — E876 Hypokalemia: Secondary | ICD-10-CM

## 2018-01-20 DIAGNOSIS — R609 Edema, unspecified: Secondary | ICD-10-CM | POA: Diagnosis not present

## 2018-01-20 DIAGNOSIS — E119 Type 2 diabetes mellitus without complications: Secondary | ICD-10-CM

## 2018-01-20 DIAGNOSIS — R300 Dysuria: Secondary | ICD-10-CM

## 2018-01-20 DIAGNOSIS — E559 Vitamin D deficiency, unspecified: Secondary | ICD-10-CM

## 2018-01-20 DIAGNOSIS — F411 Generalized anxiety disorder: Secondary | ICD-10-CM

## 2018-01-20 DIAGNOSIS — Z6838 Body mass index (BMI) 38.0-38.9, adult: Secondary | ICD-10-CM

## 2018-01-20 DIAGNOSIS — F17200 Nicotine dependence, unspecified, uncomplicated: Secondary | ICD-10-CM

## 2018-01-20 DIAGNOSIS — Z1212 Encounter for screening for malignant neoplasm of rectum: Secondary | ICD-10-CM

## 2018-01-20 DIAGNOSIS — I159 Secondary hypertension, unspecified: Secondary | ICD-10-CM

## 2018-01-20 DIAGNOSIS — N3000 Acute cystitis without hematuria: Secondary | ICD-10-CM

## 2018-01-20 LAB — URINALYSIS, COMPLETE
BILIRUBIN UA: NEGATIVE
Glucose, UA: NEGATIVE
KETONES UA: NEGATIVE
NITRITE UA: POSITIVE — AB
Protein, UA: NEGATIVE
RBC UA: NEGATIVE
SPEC GRAV UA: 1.02 (ref 1.005–1.030)
UUROB: 0.2 mg/dL (ref 0.2–1.0)
pH, UA: 6.5 (ref 5.0–7.5)

## 2018-01-20 LAB — MICROSCOPIC EXAMINATION
RBC MICROSCOPIC, UA: NONE SEEN /HPF (ref 0–2)
Renal Epithel, UA: NONE SEEN /hpf

## 2018-01-20 LAB — BAYER DCA HB A1C WAIVED: HB A1C: 6.1 % (ref <7.0)

## 2018-01-20 MED ORDER — VARENICLINE TARTRATE 0.5 MG X 11 & 1 MG X 42 PO MISC: ORAL | 0 refills | Status: DC

## 2018-01-20 MED ORDER — CARVEDILOL 3.125 MG PO TABS: 3.1250 mg | ORAL_TABLET | Freq: Two times a day (BID) | ORAL | 1 refills | Status: DC

## 2018-01-20 MED ORDER — ATORVASTATIN CALCIUM 40 MG PO TABS: ORAL_TABLET | ORAL | 5 refills | Status: AC

## 2018-01-20 MED ORDER — GLIMEPIRIDE 4 MG PO TABS: 4.0000 mg | ORAL_TABLET | Freq: Every day | ORAL | 4 refills | Status: DC

## 2018-01-20 MED ORDER — METFORMIN HCL 1000 MG PO TABS: ORAL_TABLET | ORAL | 1 refills | Status: DC

## 2018-01-20 MED ORDER — CIPROFLOXACIN HCL 500 MG PO TABS: 500.0000 mg | ORAL_TABLET | Freq: Two times a day (BID) | ORAL | 0 refills | Status: DC

## 2018-01-20 MED ORDER — FUROSEMIDE 40 MG PO TABS
ORAL_TABLET | ORAL | 5 refills | Status: DC
Start: 2018-01-20 — End: 2018-04-06

## 2018-01-20 MED ORDER — FENOFIBRATE 145 MG PO TABS: 145.0000 mg | ORAL_TABLET | Freq: Every day | ORAL | 5 refills | Status: AC

## 2018-01-20 MED ORDER — FLUTICASONE FUROATE-VILANTEROL 200-25 MCG/INH IN AEPB: 1.0000 | INHALATION_SPRAY | Freq: Every day | RESPIRATORY_TRACT | 3 refills | Status: DC

## 2018-01-20 MED ORDER — BUDESONIDE-FORMOTEROL FUMARATE 80-4.5 MCG/ACT IN AERO: 2.0000 | INHALATION_SPRAY | Freq: Two times a day (BID) | RESPIRATORY_TRACT | 3 refills | Status: DC

## 2018-01-20 NOTE — Addendum Note (Signed)
Addended by: Liliane Bade on: 01/20/2018 11:21 AM   Modules accepted: Orders

## 2018-01-20 NOTE — Patient Instructions (Signed)
Steps to Quit Smoking Smoking tobacco can be bad for your health. It can also affect almost every organ in your body. Smoking puts you and people around you at risk for many serious long-lasting (chronic) diseases. Quitting smoking is hard, but it is one of the best things that you can do for your health. It is never too late to quit. What are the benefits of quitting smoking? When you quit smoking, you lower your risk for getting serious diseases and conditions. They can include:  Lung cancer or lung disease.  Heart disease.  Stroke.  Heart attack.  Not being able to have children (infertility).  Weak bones (osteoporosis) and broken bones (fractures).  If you have coughing, wheezing, and shortness of breath, those symptoms may get better when you quit. You may also get sick less often. If you are pregnant, quitting smoking can help to lower your chances of having a baby of low birth weight. What can I do to help me quit smoking? Talk with your doctor about what can help you quit smoking. Some things you can do (strategies) include:  Quitting smoking totally, instead of slowly cutting back how much you smoke over a period of time.  Going to in-person counseling. You are more likely to quit if you go to many counseling sessions.  Using resources and support systems, such as: ? Online chats with a counselor. ? Phone quitlines. ? Printed self-help materials. ? Support groups or group counseling. ? Text messaging programs. ? Mobile phone apps or applications.  Taking medicines. Some of these medicines may have nicotine in them. If you are pregnant or breastfeeding, do not take any medicines to quit smoking unless your doctor says it is okay. Talk with your doctor about counseling or other things that can help you.  Talk with your doctor about using more than one strategy at the same time, such as taking medicines while you are also going to in-person counseling. This can help make  quitting easier. What things can I do to make it easier to quit? Quitting smoking might feel very hard at first, but there is a lot that you can do to make it easier. Take these steps:  Talk to your family and friends. Ask them to support and encourage you.  Call phone quitlines, reach out to support groups, or work with a counselor.  Ask people who smoke to not smoke around you.  Avoid places that make you want (trigger) to smoke, such as: ? Bars. ? Parties. ? Smoke-break areas at work.  Spend time with people who do not smoke.  Lower the stress in your life. Stress can make you want to smoke. Try these things to help your stress: ? Getting regular exercise. ? Deep-breathing exercises. ? Yoga. ? Meditating. ? Doing a body scan. To do this, close your eyes, focus on one area of your body at a time from head to toe, and notice which parts of your body are tense. Try to relax the muscles in those areas.  Download or buy apps on your mobile phone or tablet that can help you stick to your quit plan. There are many free apps, such as QuitGuide from the CDC (Centers for Disease Control and Prevention). You can find more support from smokefree.gov and other websites.  This information is not intended to replace advice given to you by your health care provider. Make sure you discuss any questions you have with your health care provider. Document Released: 03/16/2009 Document   Revised: 01/16/2016 Document Reviewed: 10/04/2014 Elsevier Interactive Patient Education  [employer].

## 2018-01-20 NOTE — Addendum Note (Signed)
Addended by: Chevis Pretty on: 01/20/2018 04:54 PM   Modules accepted: Orders

## 2018-01-20 NOTE — Telephone Encounter (Signed)
Please let patient know when done

## 2018-01-20 NOTE — Progress Notes (Signed)
Subjective:    Patient ID: Annette Gibson, female    DOB: 05-31-1956, 62 y.o.   MRN: [id number]   Chief Complaint: Medical Management of Chronic issues  HPI:  1. Secondary hypertension  no c/o chest pain, sob or headache. Doe not check blood pressure at home. BP Readings from Last 3 Encounters:  10/14/17 114/77  07/11/17 118/76  06/11/17 127/75    2. Mucopurulent chronic bronchitis (HCC)  Has chronic cough symbicort helps with wheezing. She is still smoking. Still smoking- want sto try chantix  3. Vitamin D deficiency  Takes a daily vitamin d supplement.  4. Peripheral edema  Takes lasix daily- edema worsens throughout day but is better in mornings.  5. Hypokalemia  Denies any lower ext cramping- labs to be checked today   6. Hyperlipidemia, unspecified hyperlipidemia type  Is on tricor daily- is not on a fenofibrate.  7. GAD (generalized anxiety disorder)  Is not on any medications rx. She is doing okay with stress management  8. BMI 38.0-38.9,adult  No recent weight changes    Outpatient Encounter Medications as of 01/20/2018  Medication Sig  . albuterol (PROVENTIL HFA;VENTOLIN HFA) 108 (90 Base) MCG/ACT inhaler Inhale 1 puff into the lungs every 6 (six) hours as needed for wheezing or shortness of breath.  Marland Kitchen albuterol (PROVENTIL) (2.5 MG/3ML) 0.083% nebulizer solution Take 3 mLs (2.5 mg total) by nebulization every 6 (six) hours as needed for wheezing or shortness of breath.  Marland Kitchen atorvastatin (LIPITOR) 40 MG tablet Take 1 tablet (40 mg total) by mouth daily at 6 PM.  . Biotin w/ Vitamins C & E (HAIR SKIN & NAILS GUMMIES) 1250-7.5-7.5 MCG-MG-UNT CHEW Chew 1 tablet by mouth daily.  . budesonide-formoterol (SYMBICORT) 80-4.5 MCG/ACT inhaler Inhale 2 puffs into the lungs 2 (two) times daily.  . carvedilol (COREG) 3.125 MG tablet Take 1 tablet (3.125 mg total) by mouth 2 (two) times daily with a meal.  . cholecalciferol (VITAMIN D) 1000 units tablet Take 1,000 Units by mouth  daily.  . Cyanocobalamin (VITAMIN B-12 PO) Take 3,000 mcg by mouth daily.  . fenofibrate (TRICOR) 145 MG tablet Take 1 tablet (145 mg total) by mouth daily.  . furosemide (LASIX) 40 MG tablet 1 tablet 40 mg every morning and 1/2 tablet (20 mg) every evening  . glimepiride (AMARYL) 4 MG tablet Take 1 tablet (4 mg total) by mouth daily before breakfast.  . metFORMIN (GLUCOPHAGE) 1000 MG tablet TAKE 1 TABLET BY MOUTH TWICE DAILY WITH A MEAL       New complaints: C/O dysuria and frequency that started about 1 week ago  Social history: Lives alone- is currently not working   Review of Systems  Constitutional: Negative for activity change and appetite change.  HENT: Negative.   Eyes: Negative for pain.  Respiratory: Negative for shortness of breath.   Cardiovascular: Negative for chest pain, palpitations and leg swelling.  Gastrointestinal: Negative for abdominal pain.  Endocrine: Negative for polydipsia.  Genitourinary: Negative.   Skin: Negative for rash.  Neurological: Negative for dizziness, weakness and headaches.  Hematological: Does not bruise/bleed easily.  Psychiatric/Behavioral: Negative.   All other systems reviewed and are negative.      Objective:   Physical Exam  Constitutional: She is oriented to person, place, and time. She appears well-developed and well-nourished. No distress.  HENT:  Head: Normocephalic.  Nose: Nose normal.  Mouth/Throat: Oropharynx is clear and moist.  Eyes: Pupils are equal, round, and reactive to light. EOM are normal.  Neck: Normal range of motion. Neck supple. No JVD present. Carotid bruit is not present.  Cardiovascular: Normal rate, regular rhythm, normal heart sounds and intact distal pulses.  Pulmonary/Chest: Effort normal and breath sounds normal. No respiratory distress. She has no wheezes. She has no rales. She exhibits no tenderness.  Abdominal: Soft. Normal appearance, normal aorta and bowel sounds are normal. She exhibits no  distension, no abdominal bruit, no pulsatile midline mass and no mass. There is no splenomegaly or hepatomegaly. There is no tenderness.  Musculoskeletal: Normal range of motion. She exhibits no edema.  Lymphadenopathy:    She has no cervical adenopathy.  Neurological: She is alert and oriented to person, place, and time. She has normal reflexes.  Skin: Skin is warm and dry.  Psychiatric: She has a normal mood and affect. Her behavior is normal. Judgment and thought content normal.   BP 118/71   Pulse 94   Temp 97.6 F (36.4 C) (Oral)   Ht _0  (1.626 m)   Wt 197 lb (89.4 kg)   BMI 33.81 kg/m    Urine - +nitrites HGBA1c 6.1%     Assessment & Plan:  Paislynn L. Simonich comes in today with chief complaint of Diabetes; Hypertension; and COPD   Diagnosis and orders addressed:  1. Secondary hypertension Low sodium diet - carvedilol (COREG) 3.125 MG tablet; Take 1 tablet (3.125 mg total) by mouth 2 (two) times daily with a meal.  Dispense: 180 tablet; Refill: 1  2. Mucopurulent chronic bronchitis (HCC) Stop smoking - budesonide-formoterol (SYMBICORT) 80-4.5 MCG/ACT inhaler; Inhale 2 puffs into the lungs 2 (two) times daily.  Dispense: 1 Inhaler; Refill: 3  3. Vitamin D deficiency Continue daily vitamin d supplement  4. Peripheral edema Elevate legs when sitting - furosemide (LASIX) 40 MG tablet; 1 tablet 40 mg every morning and 1/2 tablet (20 mg) every evening  Dispense: 30 tablet; Refill: 5  5. Hypokalemia Labs pending  6. GAD (generalized anxiety disorder) Stress management  7. BMI 38.0-38.9,adult Discussed diet and exercise for person with BMI >25 Will recheck weight in 3-6 months  8. Smoker Smoking cessation discussed - varenicline (CHANTIX STARTING MONTH PAK) 0.5 MG X 11 & 1 MG X 42 tablet; Take one 0.5 mg tablet by mouth once daily for 3 days, then increase to one 0.5 mg tablet twice daily for 4 days, then increase to one 1 mg tablet twice daily.  Dispense: 53  tablet; Refill: 0  9. Encounter for colorectal cancer screening - Cologuard  10. Mixed hyperlipidemia Low fat diet - atorvastatin (LIPITOR) 40 MG tablet; Take 1 tablet (40 mg total) by mouth daily at 6 PM.  Dispense: 30 tablet; Refill: 5 - fenofibrate (TRICOR) 145 MG tablet; Take 1 tablet (145 mg total) by mouth daily.  Dispense: 30 tablet; Refill: 5  11. Type 2 diabetes mellitus without complication, without long-term current use of insulin (HCC) Watch carbsin diet - glimepiride (AMARYL) 4 MG tablet; Take 1 tablet (4 mg total) by mouth daily before breakfast.  Dispense: 30 tablet; Refill: 4 - metFORMIN (GLUCOPHAGE) 1000 MG tablet; TAKE 1 TABLET BY MOUTH TWICE DAILY WITH A MEAL  Dispense: 180 tablet; Refill: 1  12. Dysuria - Urinalysis, Complete  13. Acute cystitis without hematuria Take medication as prescribe Cotton underwear Take shower not bath Cranberry juice, yogurt Force fluids AZO over the counter X2 days Culture pending RTO prn    Labs pending Health Maintenance reviewed Diet and exercise encouraged  Follow up plan: 3 months  Mary-Margaret Hassell Done, FNP

## 2018-01-21 LAB — CMP14+EGFR
A/G RATIO: 2 (ref 1.2–2.2)
ALBUMIN: 4.4 g/dL (ref 3.6–4.8)
ALT: 26 IU/L (ref 0–32)
AST: 21 IU/L (ref 0–40)
Alkaline Phosphatase: 55 IU/L (ref 39–117)
BUN / CREAT RATIO: 18 (ref 12–28)
BUN: 19 mg/dL (ref 8–27)
Bilirubin Total: 0.4 mg/dL (ref 0.0–1.2)
CALCIUM: 10.1 mg/dL (ref 8.7–10.3)
CO2: 34 mmol/L — ABNORMAL HIGH (ref 20–29)
Chloride: 93 mmol/L — ABNORMAL LOW (ref 96–106)
Creatinine, Ser: 1.03 mg/dL — ABNORMAL HIGH (ref 0.57–1.00)
GFR, EST AFRICAN AMERICAN: 68 mL/min/{1.73_m2} (ref >59)
GFR, EST NON AFRICAN AMERICAN: 59 mL/min/{1.73_m2} — AB (ref >59)
GLOBULIN, TOTAL: 2.2 g/dL (ref 1.5–4.5)
Glucose: 138 mg/dL — ABNORMAL HIGH (ref 65–99)
POTASSIUM: 4.9 mmol/L (ref 3.5–5.2)
SODIUM: 145 mmol/L — AB (ref 134–144)
TOTAL PROTEIN: 6.6 g/dL (ref 6.0–8.5)

## 2018-01-21 LAB — LIPID PANEL
CHOLESTEROL TOTAL: 131 mg/dL (ref 100–199)
Chol/HDL Ratio: 2.9 ratio (ref 0.0–4.4)
HDL: 45 mg/dL (ref >39)
LDL Calculated: 47 mg/dL (ref 0–99)
TRIGLYCERIDES: 193 mg/dL — AB (ref 0–149)
VLDL Cholesterol Cal: 39 mg/dL (ref 5–40)

## 2018-01-23 LAB — URINE CULTURE

## 2018-01-23 NOTE — Telephone Encounter (Signed)
Needs prior auth on Breo. Gave samples.

## 2018-01-27 ENCOUNTER — Telehealth: Payer: Self-pay

## 2018-01-27 NOTE — Telephone Encounter (Signed)
Medicaid non preferred Breo Ellipta  Preferred are Advair Diskus Dulera inhaler and Symbicort inhaler

## 2018-01-27 NOTE — Telephone Encounter (Signed)
Insurance will not approve BREO- ask patient if she has tried any of the other inhaers listed

## 2018-01-27 NOTE — Telephone Encounter (Signed)
Patient has tried Symbicort and it did not help.  She has not been on Advair or Dulera.

## 2018-01-29 NOTE — Telephone Encounter (Signed)
Has used symbicort which did not help. BREO is only thing that works- can we Medical laboratory scientific officer

## 2018-04-06 ENCOUNTER — Other Ambulatory Visit: Payer: Self-pay | Admitting: *Deleted

## 2018-04-06 DIAGNOSIS — R609 Edema, unspecified: Secondary | ICD-10-CM

## 2018-04-06 MED ORDER — FUROSEMIDE 40 MG PO TABS: ORAL_TABLET | ORAL | 0 refills | Status: DC

## 2018-04-23 ENCOUNTER — Ambulatory Visit: Payer: Medicaid Other | Admitting: Nurse Practitioner

## 2018-05-05 ENCOUNTER — Encounter: Payer: Self-pay | Admitting: Nurse Practitioner

## 2019-12-13 ENCOUNTER — Encounter: Payer: Self-pay | Admitting: Internal Medicine

## 2020-01-12 HISTORY — PX: OTHER SURGICAL HISTORY: SHX169

## 2020-01-18 ENCOUNTER — Other Ambulatory Visit: Payer: Self-pay | Admitting: Nephrology

## 2020-01-18 DIAGNOSIS — N179 Acute kidney failure, unspecified: Secondary | ICD-10-CM

## 2020-01-18 DIAGNOSIS — N1832 Chronic kidney disease, stage 3b: Secondary | ICD-10-CM

## 2020-02-03 ENCOUNTER — Ambulatory Visit
Admission: RE | Admit: 2020-02-03 | Discharge: 2020-02-03 | Disposition: A | Discharge status: Home / Self Care | Payer: Medicare Other | Source: Ambulatory Visit | Attending: Nephrology | Admitting: Nephrology

## 2020-02-03 DIAGNOSIS — N179 Acute kidney failure, unspecified: Secondary | ICD-10-CM

## 2020-02-03 DIAGNOSIS — N1832 Chronic kidney disease, stage 3b: Secondary | ICD-10-CM

## 2020-02-10 ENCOUNTER — Encounter: Payer: Self-pay | Admitting: Gastroenterology

## 2020-02-10 NOTE — Progress Notes (Signed)
Referring Provider: Bridget Hartshorn, NP Primary Care Physician:  Bridget Hartshorn, NP Primary Gastroenterologist:  Dr. Abbey Chatters  Chief Complaint  Patient presents with  . + cologuard    no prior TCS. No RB, no constipation. Daughter had polyps on TCS prior    HPI:   Annette Gibson is a 64 y.o. female presenting today at the request of Hember[place], Karie Schwalbe, NP due to positive Cologuard.  Reviewed referral information.  Office visit with PCP 11/10/2019.  Chronic medical conditions were reviewed.  She is taking trazodone for insomnia as needed.  Generalized anxiety was stable on Wellbutrin.  Hypertension chronic diastolic congestive heart failure was stable on Lasix and losartan.  Diabetes fairly well controlled with Metformin and glipizide.  COPD stable and also following with Dr. Geraldo Pitter.  She had gained 15-20 pounds since quitting smoking and she was counseled on weight loss through diet and exercise.  Labs were ordered including CMP, lipid panel, hemoglobin A1c, and Cologuard.  Cologuard completed 11/22/2019. Reviewed additional labs provided with referral. Hemoglobin A1c 6.5.  CMP  remarkable for creatinine 1.95, glucose 165, sodium 145, ALT 33.  ALT had previously elevated at 53 in March 2021.  For complete results, see lab section below.   Today: No prior colonoscopy.  Daughter had polyps when she was 4 years old. States she didn't have a TCS, just removed polyps. No bright red blood per rectum or melena.  No constipation or diarrhea.  No abdominal pain. No unintentional weight loss. No family history of colon cancer.   No GERD symptoms, dysphagia, nausea, or vomiting.  Mild LFT elevation: Had labs repeated with Harrie Jeans, MD about 2 weeks ago. On Lasix chronically. No bruising or bleeding. No jaundice or confusion. Alcohol once in a blue moon. No history of drug use. No known exposure to hepatitis. No tattoos.  No significant Tylenol use.  No shortness of breath or cough. No  chest pain or heart palpitations. Lasix keep swelling in lower extremities well controlled.  Recently had worked like tissue removed from perineal area.  This is still healing.  Reviewed records..  She had excision of perineal condyloma acuminata on 01/12/2020.  Past Medical History:  Diagnosis Date  . Anal warts    anal warts  . Anxiety   . CKD (chronic kidney disease)   . Congestive heart failure (CHF) ([place])   . COPD (chronic obstructive pulmonary disease) ([place])   . Diabetes mellitus without complication ([place])   . Hypertriglyceridemia   . Obesity    metabolic syndrome   . Osteopenia   . Vitamin D deficiency     Past Surgical History:  Procedure Laterality Date  . ABDOMINAL HYSTERECTOMY    . FRACTURE SURGERY     Right leg and ankle  . lt ganglion cyst removal  09/2007    Current Outpatient Medications  Medication Sig Dispense Refill  . albuterol (PROVENTIL HFA;VENTOLIN HFA) 108 (90 Base) MCG/ACT inhaler Inhale 1 puff into the lungs every 6 (six) hours as needed for wheezing or shortness of breath. 1 Inhaler 1  . albuterol (PROVENTIL) (2.5 MG/3ML) 0.083% nebulizer solution Take 3 mLs (2.5 mg total) by nebulization every 6 (six) hours as needed for wheezing or shortness of breath. 150 mL 1  . atorvastatin (LIPITOR) 40 MG tablet Take 1 tablet (40 mg total) by mouth daily at 6 PM. 30 tablet 5  . buPROPion (WELLBUTRIN SR) 150 MG 12 hr tablet Take 150 mg by mouth daily.    Marland Kitchen  carvedilol (COREG) 3.125 MG tablet Take 1 tablet (3.125 mg total) by mouth 2 (two) times daily with a meal. 180 tablet 1  . Cyanocobalamin (VITAMIN B-12 PO) Take 3,000 mcg by mouth daily.    . fenofibrate (TRICOR) 145 MG tablet Take 1 tablet (145 mg total) by mouth daily. 30 tablet 5  . furosemide (LASIX) 40 MG tablet 1 tablet 40 mg every morning and 1/2 tablet (20 mg) every evening 90 tablet 0  . glimepiride (AMARYL) 4 MG tablet Take 1 tablet (4 mg total) by mouth daily before breakfast. 30 tablet 4  . metFORMIN  (GLUCOPHAGE) 1000 MG tablet TAKE 1 TABLET BY MOUTH TWICE DAILY WITH A MEAL 180 tablet 1  . mupirocin ointment (BACTROBAN) 2 % 1 application 2 (two) times daily.     No current facility-administered medications for this visit.    Allergies as of 02/11/2020 - Review Complete 02/11/2020  Allergen Reaction Noted  . Celebrex [celecoxib] Other (See Comments) 10/23/2010  . Voltaren [diclofenac sodium] Other (See Comments) 05/06/2014    Family History  Problem Relation Age of Onset  . Pneumonia Mother   . Diabetes Mother   . Colon cancer Neg Hx     Social History   Socioeconomic History  . Marital status: Single    Spouse name: Not on file  . Number of children: Not on file  . Years of education: Not on file  . Highest education level: Not on file  Occupational History  . Not on file  Tobacco Use  . Smoking status: Former Smoker    Quit date: 06/04/2019    Years since quitting: 0.6  . Smokeless tobacco: Never Used  Substance and Sexual Activity  . Alcohol use: No  . Drug use: No  . Sexual activity: Not on file  Other Topics Concern  . Not on file  Social History Narrative  . Not on file   Social Determinants of Health   Financial Resource Strain:   . Difficulty of Paying Living Expenses: Not on file  Food Insecurity:   . Worried About Charity fundraiser in the Last Year: Not on file  . Ran Out of Food in the Last Year: Not on file  Transportation Needs:   . Lack of Transportation (Medical): Not on file  . Lack of Transportation (Non-Medical): Not on file  Physical Activity:   . Days of Exercise per Week: Not on file  . Minutes of Exercise per Session: Not on file  Stress:   . Feeling of Stress : Not on file  Social Connections:   . Frequency of Communication with Friends and Family: Not on file  . Frequency of Social Gatherings with Friends and Family: Not on file  . Attends Religious Services: Not on file  . Active Member of Clubs or Organizations: Not on file    . Attends Archivist Meetings: Not on file  . Marital Status: Not on file  Intimate Partner Violence:   . Fear of Current or Ex-Partner: Not on file  . Emotionally Abused: Not on file  . Physically Abused: Not on file  . Sexually Abused: Not on file    Review of Systems: Gen: Denies any fever, chills, cold or flulike symptoms, lightheadedness, dizziness, presyncope, syncope. CV: See HPI Resp: See HPI GI: See HPI GU : Denies dysuria or hematuria Psych: Denies depression or anxiety Heme: See HPI  Physical Exam: BP 127/75   Pulse 88   Temp (!) 97.3 F (36.3  C) (Oral)   Ht _0  (1.626 m)   Wt 207 lb (93.9 kg)   BMI 35.53 kg/m  General:   Alert and oriented. Pleasant and cooperative. Well-nourished and well-developed.  Head:  Normocephalic and atraumatic. Eyes:  Without icterus, sclera clear and conjunctiva pink.  Ears:  Normal auditory acuity. Lungs:  Clear to auscultation bilaterally. No wheezes, rales, or rhonchi. No distress.  Heart:  S1, S2 present without murmurs appreciated.  Abdomen:  +BS, soft, non-tender and non-distended. No HSM noted. No guarding or rebound. No masses appreciated.  Rectal:  Deferred  Msk:  Symmetrical without gross deformities. Normal posture. Extremities:  Without edema. Neurologic:  Alert and  oriented x4;  grossly normal neurologically. Skin:  Intact without significant lesions or rashes. Psych:  Normal mood and affect.  Labs: 11/22/2019 Cologuard: Positive  11/10/2019 Hemoglobin A1c: 6.5 (H) Lipid panel: Total cholesterol 164, triglycerides 250 (H), HDL 50, VLDL 41 (H), LDL 73 CMP: Glucose 165 (H), BUN 41 (H), creatinine 1.95 (H), sodium 145 (H), potassium 4.9, chloride 101, calcium 10.4 (H), total protein 6.3, albumin 4.2, total bilirubin 0.2, alk phos 53, AST 32, ALT 33 (H).   08/11/19:  Hemoglobin A1c 9.3 (H) CMP: Glucose 167 (H), BUN 33 (H), creatinine 1.5 (H), sodium 142, potassium 5.2, chloride 95 (L), calcium 10.6 (H),  total protein 6.9, albumin 4.3, total bilirubin 0.4, alk phos 78, AST 39, ALT 53 (H)

## 2020-02-11 ENCOUNTER — Encounter: Payer: Self-pay | Admitting: Gastroenterology

## 2020-02-11 ENCOUNTER — Ambulatory Visit: Payer: Medicare Other | Admitting: Gastroenterology

## 2020-02-11 ENCOUNTER — Encounter: Payer: Self-pay | Admitting: *Deleted

## 2020-02-11 ENCOUNTER — Other Ambulatory Visit: Payer: Self-pay

## 2020-02-11 DIAGNOSIS — R195 Other fecal abnormalities: Secondary | ICD-10-CM | POA: Insufficient documentation

## 2020-02-11 DIAGNOSIS — R7401 Elevation of levels of liver transaminase levels: Secondary | ICD-10-CM

## 2020-02-11 DIAGNOSIS — R7989 Other specified abnormal findings of blood chemistry: Secondary | ICD-10-CM | POA: Insufficient documentation

## 2020-02-11 NOTE — Patient Instructions (Addendum)
We will be scheduled for a colonoscopy in the near future with Dr. Abbey Chatters. 1 day prior to colonoscopy: Take one half dose of metformin (500 mg twice daily) and one half dose of glipizide (2 mg). Day of procedure: Do not take morning diabetes medications.  We are requesting your recent lab results from Harrie Jeans, MD.  We will call you with additional recommendations.  We will plan to follow-up with you in the office after your colonoscopy.  Do not hesitate to call if you have questions or concerns prior.  Aliene Altes, PA-C [facility]

## 2020-02-13 ENCOUNTER — Encounter: Payer: Self-pay | Admitting: Gastroenterology

## 2020-02-13 DIAGNOSIS — R7401 Elevation of levels of liver transaminase levels: Secondary | ICD-10-CM | POA: Insufficient documentation

## 2020-02-13 NOTE — Assessment & Plan Note (Addendum)
Mild elevation of ALT at 53 in March 2021, down to 33 (minimally elevated) in June 2021.  Patient reports recently having lab work completed 2 weeks ago, but these results are not available to me.  Patient reports drinking alcohol "once in a blue moon".  No history of drug use.  No significant Tylenol use.  No known exposure to hepatitis.  No tattoos.  No signs or symptoms of decompensated liver disease.  We discussed the likelihood that very mildly elevated LFTs may very well be secondary to fatty liver with risk factors including obesity, HTN, HLD, diabetes.  Discussed the typical initial work-up for elevated LFTs including repeat labs, evaluation for hepatitis, iron panel, and ultrasound.  Patient prefers not to have any additional work-up at this time until I request labs that were completed 2 weeks ago.   We will request labs from Harrie Jeans, MD with further recommendations to follow.

## 2020-02-13 NOTE — Assessment & Plan Note (Deleted)
Mild elevation of ALT

## 2020-02-13 NOTE — Assessment & Plan Note (Signed)
64 year old female with no prior colonoscopy who recently had positive Cologuard test in June 2021.  No significant GI symptoms.  No BRBPR, melena, or unintentional weight loss.  Reports her daughter had ?polyps removed at the age of 2 with no colonoscopy.  No family history of colon cancer.  Plan: Proceed with colonoscopy with propofol with Dr. Abbey Chatters in the near future. The risks, benefits, and alternatives have been discussed in detail with patient. They have stated understanding and desire to proceed.  ASA III See separate instructions for diabetes medication adjustments. Follow-up after colonoscopy.

## 2020-02-14 ENCOUNTER — Encounter: Payer: Self-pay | Admitting: *Deleted

## 2020-02-27 ENCOUNTER — Telehealth: Payer: Self-pay | Admitting: Gastroenterology

## 2020-02-27 NOTE — Telephone Encounter (Signed)
Please let patient know I received labs from Harrie Jeans, MD (nephrologist) as she requested.  No labs were obtained to evaluate her LFTs. She had BMP and UA completed.   We discussed the fact that ALT (one of her liver function test) was mildly elevated at her last office visit.  She requested that I review labs completed with Harrie Jeans, MD prior to further work-up in the instance she already had some necessary labs completed.  As none of the labs we needed were completed, I recommend we need to go ahead and pursue basic evaluation including repeat LFTs, evaluation for hepatitis B and C, iron panel to evaluate for hemochromatosis, and update an ultrasound of her abdomen.  [place] Nurse: If patient is agreeable, please arrange HFP, hepatitis B core antibody (total), hepatitis B surface antibody, hepatitis B surface antigen, hepatitis C antibody, iron panel with ferritin. Dx: Elevated LFTs.   [facility] Clinical Pool: Please arrange ultrasound abdomen.  Dx: Elevated LFTs

## 2020-02-28 ENCOUNTER — Other Ambulatory Visit: Payer: Self-pay

## 2020-02-28 ENCOUNTER — Other Ambulatory Visit: Payer: Self-pay | Admitting: *Deleted

## 2020-02-28 DIAGNOSIS — R7989 Other specified abnormal findings of blood chemistry: Secondary | ICD-10-CM

## 2020-02-28 NOTE — Telephone Encounter (Signed)
[place] scheduled for 10/5 at 9:30am, arrival 9:15am, npo midnight.  Called pt and VM not set up

## 2020-02-28 NOTE — Telephone Encounter (Signed)
Spoke with pt. Pt was notified that labs were reviewed and pt will need more labs completed. Pt's lab orders were mailed this morning. Pt has an apt with her doctor March 08, 2020 and pt will complete labs then. Pt is agreeable to have u/s, please arrange.

## 2020-02-29 NOTE — Telephone Encounter (Signed)
Patient aware of appt details. Nothing further needed

## 2020-02-29 NOTE — Telephone Encounter (Signed)
Called pt, vm not set up

## 2020-03-07 ENCOUNTER — Other Ambulatory Visit: Payer: Self-pay

## 2020-03-07 ENCOUNTER — Ambulatory Visit (HOSPITAL_COMMUNITY)
Admission: RE | Admit: 2020-03-07 | Discharge: 2020-03-07 | Disposition: A | Discharge status: Home / Self Care | Payer: Medicare Other | Source: Ambulatory Visit | Attending: Gastroenterology | Admitting: Gastroenterology

## 2020-03-07 DIAGNOSIS — R7989 Other specified abnormal findings of blood chemistry: Secondary | ICD-10-CM | POA: Insufficient documentation

## 2020-03-13 ENCOUNTER — Telehealth: Payer: Self-pay | Admitting: Gastroenterology

## 2020-03-13 ENCOUNTER — Telehealth: Payer: Self-pay | Admitting: Internal Medicine

## 2020-03-13 DIAGNOSIS — K7460 Unspecified cirrhosis of liver: Secondary | ICD-10-CM

## 2020-03-13 DIAGNOSIS — R7989 Other specified abnormal findings of blood chemistry: Secondary | ICD-10-CM

## 2020-03-13 NOTE — Telephone Encounter (Signed)
Pt said she had her labs done at her PCP and the results would be in epic for [place] to review.

## 2020-03-13 NOTE — Telephone Encounter (Signed)
Annette Gibson, can you request recent labs from patients PCP? She was supposed to have labs I ordered completed with her PCP on 10/8.

## 2020-03-13 NOTE — Telephone Encounter (Signed)
Routing to [employer], [place] is [place].

## 2020-03-13 NOTE — Telephone Encounter (Signed)
Reviewed labs in Care Everywhere completed 03/10/2020.  Hemoglobin 11.2 (L) with normocytic indices.  This is down from 14.7 in March 2021.  Platelets within normal limits. Creatinine 3.51 (H), sodium 145 (H), potassium 5.4 (H) LFTs within normal limits. Hepatitis B and C testing negative.  No immunity to hepatitis B. Iron panel with ferritin 187 (H), iron saturation 22%, iron 86.  [place] nurse:  Please let patient know I have reviewed her labs completed 03/10/2020.  Her LFTs are within normal limits.  Hepatitis B and C negative.  Iron panel with slightly elevated ferritin which could be secondary to CKD.  Her hemoglobin has declined to 11.2 from 14.7 in March 2021.  This may also be secondary to chronic kidney disease as her creatinine has worsened quite a bit compared to June 2021.   Recommendations: We will need to obtain a few additional labs to follow-up on early cirrhosis on ultrasound.  Please arrange INR, Hep A antibody, fibrosure. Dx Early cirrhosis 2.  Would also like to obtain elastography. [facility] Clinical Pool please arrange. Dx: Early cirrhosis 3.  We can plan to recheck iron panel in a few months. 4.  Proceed with TCS as planned.  5.  We will leave additional recommendations regarding elevated creatinine and elevated potassium to her PCP. If she has not heard from them, I urge her to reach out to them to follow-up today. She also needs to reach out to her nephrologist to follow up ASAP as her kidney function is worsening. Unfortunately I do not remember what her creatinine was when she had it checked with Dr. Royce Macadamia about 1 month ago, but her acutely worsened kidney function may require hospitalization.

## 2020-03-13 NOTE — Telephone Encounter (Signed)
requested

## 2020-03-14 ENCOUNTER — Other Ambulatory Visit: Payer: Self-pay | Admitting: *Deleted

## 2020-03-14 NOTE — Telephone Encounter (Signed)
Called pt and made her aware of Aliene Altes, PA's results and recommendations.  She was made aware that we need to obtain a few additional labs on her to f/u on early cirrhosis.  She requested to have them done at [facility].  Pt informed that she needs to have an elastography due to early cirrhosis.  Pt informed to proceed with TCS as planned.  She was advised to follow up with PCP and nephrologist due to elevated creatinine and potassium and worsening kidney function.  Pt voiced understanding.

## 2020-03-14 NOTE — Telephone Encounter (Signed)
Called pt. Aware elastography scheduled for 10/15, arrival 9:15am, npo midnight. Nothing further needed

## 2020-03-14 NOTE — Addendum Note (Signed)
Addended by: Cheron Every on: 03/14/2020 04:34 PM   Modules accepted: Orders

## 2020-03-14 NOTE — Telephone Encounter (Signed)
Lmom of home number for pt to call me back.

## 2020-03-15 ENCOUNTER — Other Ambulatory Visit: Payer: Self-pay | Admitting: *Deleted

## 2020-03-15 DIAGNOSIS — K7460 Unspecified cirrhosis of liver: Secondary | ICD-10-CM

## 2020-03-15 DIAGNOSIS — R7989 Other specified abnormal findings of blood chemistry: Secondary | ICD-10-CM

## 2020-03-15 NOTE — Telephone Encounter (Signed)
Noted. She should at least have INR prior to colonoscopy. This lab reflects her bleeding risk which can be increased in the setting of cirrhosis.

## 2020-03-15 NOTE — Telephone Encounter (Signed)
I called PCPs office today to ensure labs from 10/8 had been seen considering worseining creatinine. Lars Mage, NP has addressed labs with recommendations to follow-up with nephrology and discontinue nephrotoxic medications.

## 2020-03-15 NOTE — Telephone Encounter (Signed)
Discussed with Aliene Altes, PA and pt will need Iron Panel in 2 months.  Lab orders placed.

## 2020-03-15 NOTE — Telephone Encounter (Signed)
Called pt back and she said that she will get Dr. Claris Gibson to draw labs that we requested.  I will call them when they open in the morning to see if they have access to Epic.

## 2020-03-15 NOTE — Telephone Encounter (Signed)
Pt called in to inform us that she could not get labs drawn today because she owes money and can't pay on a bill that she has there.  Informed her that I think she can go to [facility] lab to have drawn but she declined.  She said she wanted to hold off on labs for now but would proceed with elastography scheduled for 03/17/2020.  FYI to [employer], [place].

## 2020-03-16 NOTE — Telephone Encounter (Signed)
Spoke to Cable at Dr. Lianne Moris office this morning and she said that pt could have labs drawn at their office.  She said they have access to Epic.  Called pt and informed her.  She is going to call them to set up appt to have labs drawn.  Routing to [employer], [place] as Juluis Rainier.

## 2020-03-16 NOTE — Telephone Encounter (Signed)
Noted.

## 2020-03-17 ENCOUNTER — Ambulatory Visit (HOSPITAL_COMMUNITY)
Admission: RE | Admit: 2020-03-17 | Discharge: 2020-03-17 | Disposition: A | Discharge status: Home / Self Care | Payer: Medicare Other | Source: Ambulatory Visit | Attending: Gastroenterology | Admitting: Gastroenterology

## 2020-03-17 ENCOUNTER — Other Ambulatory Visit: Payer: Self-pay

## 2020-03-17 DIAGNOSIS — K7460 Unspecified cirrhosis of liver: Secondary | ICD-10-CM | POA: Diagnosis present

## 2020-03-22 ENCOUNTER — Telehealth: Payer: Self-pay | Admitting: *Deleted

## 2020-03-22 ENCOUNTER — Encounter: Payer: Self-pay | Admitting: *Deleted

## 2020-03-22 NOTE — Telephone Encounter (Signed)
LMOVM for to return call to r/s procedure currently scheduled for 11/2 at 8:15am

## 2020-03-22 NOTE — Telephone Encounter (Signed)
Pt returned call and rescheduled to 11/30 at 3:00pm. Aware will mail new instructions with new pre-op/covid test appt. Called endo and LMOVM

## 2020-03-31 ENCOUNTER — Other Ambulatory Visit (HOSPITAL_COMMUNITY): Payer: Medicare Other

## 2020-04-23 NOTE — Telephone Encounter (Signed)
Reviewed additional labs I had requested in care everywhere.  INR within normal limits.  Hepatitis A antibody negative.  This means she does not have immunity to hepatitis A.  Fibrosis score could not be calculated due to lack of information.  We will try to call the lab to provide the necessary information for fibrosis score to be calculated. She should receive Hep A and B vaccine. Please provide prescription for vaccination to patient.   Dena: Comment on fibrosis score stated this could not be calculated because 1 or more the following were not provided height, weight, age, and/or gender.  Recommended calling [phone] to provide this information in order for the score to be calculated.  Please try calling the number above to provide the additional information and request rest to be faxed to our office. Patient information: Height: 5'4" Weight: 207 lb Age: 64 y.o. Gender: Female

## 2020-04-24 ENCOUNTER — Other Ambulatory Visit: Payer: Self-pay

## 2020-04-24 DIAGNOSIS — K7460 Unspecified cirrhosis of liver: Secondary | ICD-10-CM

## 2020-04-24 DIAGNOSIS — R7989 Other specified abnormal findings of blood chemistry: Secondary | ICD-10-CM

## 2020-04-24 NOTE — Telephone Encounter (Signed)
I called [facility]- they do not have the blood in the lab any longer. They only hold it for 7 days. The patient will have to have the lab redrawn and the patient information (ht, wt, age, gender) will need to be added on to the lab slip.

## 2020-04-24 NOTE — Telephone Encounter (Signed)
Noted. Please arrange. We also need to request for results to be faxed to our office once completed. They are not resulting to my box. They only show up in care everywhere.

## 2020-04-25 NOTE — Patient Instructions (Signed)
Annette Gibson  04/25/2020     _0 @   Your procedure is scheduled on 05/02/2020.  Report to [facility] at  1245  P.M.  Call this number if you have problems the morning of surgery:  [phone]   Remember:  Follow the diet and prep instructions given to you by the office.                        Take these medicines the morning of surgery with A SIP OF WATER  Wellbutrin, carvedilol. Use your nebulizer and your inhaler before you come.    Do not wear jewelry, make-up or nail polish.  Do not wear lotions, powders, or perfumes, Please wear deodorant and brush your teeth.  Do not shave 48 hours prior to surgery.  Men may shave face and neck.  Do not bring valuables to the hospital.  [facility] is not responsible for any belongings or valuables.  Contacts, dentures or bridgework may not be worn into surgery.  Leave your suitcase in the car.  After surgery it may be brought to your room.  For patients admitted to the hospital, discharge time will be determined by your treatment team.  Patients discharged the day of surgery will not be allowed to drive home.   Name and phone number of your driver:   family Special instructions:   DO NOT smoke the morning of your procedure.  Please read over the following fact sheets that you were given. Anesthesia Post-op Instructions and Care and Recovery After Surgery       Colonoscopy, Adult, Care After This sheet gives you information about how to care for yourself after your procedure. Your health care provider may also give you more specific instructions. If you have problems or questions, contact your health care provider. What can I expect after the procedure? After the procedure, it is common to have:  A small amount of blood in your stool for 24 hours after the procedure.  Some gas.  Mild cramping or bloating of your abdomen. Follow these instructions at home: Eating and drinking   Drink enough  fluid to keep your urine pale yellow.  Follow instructions from your health care provider about eating or drinking restrictions.  Resume your normal diet as instructed by your health care provider. Avoid heavy or fried foods that are hard to digest. Activity  Rest as told by your health care provider.  Avoid sitting for a long time without moving. Get up to take short walks every 1-2 hours. This is important to improve blood flow and breathing. Ask for help if you feel weak or unsteady.  Return to your normal activities as told by your health care provider. Ask your health care provider what activities are safe for you. Managing cramping and bloating   Try walking around when you have cramps or feel bloated.  Apply heat to your abdomen as told by your health care provider. Use the heat source that your health care provider recommends, such as a moist heat pack or a heating pad. ? Place a towel between your skin and the heat source. ? Leave the heat on for 20-30 minutes. ? Remove the heat if your skin turns bright red. This is especially important if you are unable to feel pain, heat, or cold. You may have a greater risk of getting burned. General instructions  For the first 24 hours after the procedure: ?  Do not drive or use machinery. ? Do not sign important documents. ? Do not drink alcohol. ? Do your regular daily activities at a slower pace than normal. ? Eat soft foods that are easy to digest.  Take over-the-counter and prescription medicines only as told by your health care provider.  Keep all follow-up visits as told by your health care provider. This is important. Contact a health care provider if:  You have blood in your stool 2-3 days after the procedure. Get help right away if you have:  More than a small spotting of blood in your stool.  Large blood clots in your stool.  Swelling of your abdomen.  Nausea or vomiting.  A fever.  Increasing pain in your  abdomen that is not relieved with medicine. Summary  After the procedure, it is common to have a small amount of blood in your stool. You may also have mild cramping and bloating of your abdomen.  For the first 24 hours after the procedure, do not drive or use machinery, sign important documents, or drink alcohol.  Get help right away if you have a lot of blood in your stool, nausea or vomiting, a fever, or increased pain in your abdomen. This information is not intended to replace advice given to you by your health care provider. Make sure you discuss any questions you have with your health care provider. Document Revised: 12/14/2018 Document Reviewed: 12/14/2018 Elsevier Patient Education  [place] After These instructions provide you with information about caring for yourself after your procedure. Your health care provider may also give you more specific instructions. Your treatment has been planned according to current medical practices, but problems sometimes occur. Call your health care provider if you have any problems or questions after your procedure. What can I expect after the procedure? After your procedure, you may:  Feel sleepy for several hours.  Feel clumsy and have poor balance for several hours.  Feel forgetful about what happened after the procedure.  Have poor judgment for several hours.  Feel nauseous or vomit.  Have a sore throat if you had a breathing tube during the procedure. Follow these instructions at home: For at least 24 hours after the procedure:      Have a responsible adult stay with you. It is important to have someone help care for you until you are awake and alert.  Rest as needed.  Do not: ? Participate in activities in which you could fall or become injured. ? Drive. ? Use heavy machinery. ? Drink alcohol. ? Take sleeping pills or medicines that cause drowsiness. ? Make important decisions or  sign legal documents. ? Take care of children on your own. Eating and drinking  Follow the diet that is recommended by your health care provider.  If you vomit, drink water, juice, or soup when you can drink without vomiting.  Make sure you have little or no nausea before eating solid foods. General instructions  Take over-the-counter and prescription medicines only as told by your health care provider.  If you have sleep apnea, surgery and certain medicines can increase your risk for breathing problems. Follow instructions from your health care provider about wearing your sleep device: ? Anytime you are sleeping, including during daytime naps. ? While taking prescription pain medicines, sleeping medicines, or medicines that make you drowsy.  If you smoke, do not smoke without supervision.  Keep all follow-up visits as told by your health care  provider. This is important. Contact a health care provider if:  You keep feeling nauseous or you keep vomiting.  You feel light-headed.  You develop a rash.  You have a fever. Get help right away if:  You have trouble breathing. Summary  For several hours after your procedure, you may feel sleepy and have poor judgment.  Have a responsible adult stay with you for at least 24 hours or until you are awake and alert. This information is not intended to replace advice given to you by your health care provider. Make sure you discuss any questions you have with your health care provider. Document Revised: 08/18/2017 Document Reviewed: 09/10/2015 Elsevier Patient Education  [place].

## 2020-05-01 ENCOUNTER — Encounter (HOSPITAL_COMMUNITY)
Admission: RE | Admit: 2020-05-01 | Discharge: 2020-05-01 | Disposition: A | Discharge status: Home / Self Care | Payer: Medicare Other | Source: Ambulatory Visit | Attending: Internal Medicine | Admitting: Internal Medicine

## 2020-05-01 ENCOUNTER — Encounter (HOSPITAL_COMMUNITY): Payer: Self-pay

## 2020-05-01 ENCOUNTER — Other Ambulatory Visit (HOSPITAL_COMMUNITY)
Admission: RE | Admit: 2020-05-01 | Discharge: 2020-05-01 | Disposition: A | Discharge status: Home / Self Care | Payer: Medicare Other | Source: Ambulatory Visit | Attending: Internal Medicine | Admitting: Internal Medicine

## 2020-05-01 ENCOUNTER — Other Ambulatory Visit: Payer: Self-pay

## 2020-05-01 DIAGNOSIS — Z01818 Encounter for other preprocedural examination: Secondary | ICD-10-CM | POA: Diagnosis present

## 2020-05-01 DIAGNOSIS — Z20822 Contact with and (suspected) exposure to covid-19: Secondary | ICD-10-CM | POA: Diagnosis not present

## 2020-05-01 LAB — CBC WITH DIFFERENTIAL/PLATELET
Abs Immature Granulocytes: 0.03 10*3/uL (ref 0.00–0.07)
Basophils Absolute: 0.1 10*3/uL (ref 0.0–0.1)
Basophils Relative: 1 %
Eosinophils Absolute: 0.3 10*3/uL (ref 0.0–0.5)
Eosinophils Relative: 3 %
HCT: 38.7 % (ref 36.0–46.0)
Hemoglobin: 12.1 g/dL (ref 12.0–15.0)
Immature Granulocytes: 0 %
Lymphocytes Relative: 36 %
Lymphs Abs: 3.2 10*3/uL (ref 0.7–4.0)
MCH: 31.5 pg (ref 26.0–34.0)
MCHC: 31.3 g/dL (ref 30.0–36.0)
MCV: 100.8 fL — ABNORMAL HIGH (ref 80.0–100.0)
Monocytes Absolute: 0.7 10*3/uL (ref 0.1–1.0)
Monocytes Relative: 8 %
Neutro Abs: 4.6 10*3/uL (ref 1.7–7.7)
Neutrophils Relative %: 52 %
Platelets: 353 10*3/uL (ref 150–400)
RBC: 3.84 MIL/uL — ABNORMAL LOW (ref 3.87–5.11)
RDW: 13.2 % (ref 11.5–15.5)
WBC: 8.8 10*3/uL (ref 4.0–10.5)
nRBC: 0 % (ref 0.0–0.2)

## 2020-05-01 LAB — COMPREHENSIVE METABOLIC PANEL
ALT: 55 U/L — ABNORMAL HIGH (ref 0–44)
AST: 47 U/L — ABNORMAL HIGH (ref 15–41)
Albumin: 3.9 g/dL (ref 3.5–5.0)
Alkaline Phosphatase: 40 U/L (ref 38–126)
Anion gap: 10 (ref 5–15)
BUN: 29 mg/dL — ABNORMAL HIGH (ref 8–23)
CO2: 33 mmol/L — ABNORMAL HIGH (ref 22–32)
Calcium: 9.5 mg/dL (ref 8.9–10.3)
Chloride: 96 mmol/L — ABNORMAL LOW (ref 98–111)
Creatinine, Ser: 2.48 mg/dL — ABNORMAL HIGH (ref 0.44–1.00)
GFR, Estimated: 21 mL/min — ABNORMAL LOW (ref >60)
Glucose, Bld: 219 mg/dL — ABNORMAL HIGH (ref 70–99)
Potassium: 4.2 mmol/L (ref 3.5–5.1)
Sodium: 139 mmol/L (ref 135–145)
Total Bilirubin: 0.6 mg/dL (ref 0.3–1.2)
Total Protein: 6.9 g/dL (ref 6.5–8.1)

## 2020-05-01 LAB — PROTIME-INR
INR: 1 (ref 0.8–1.2)
Prothrombin Time: 12.7 seconds (ref 11.4–15.2)

## 2020-05-01 LAB — HEPATITIS B SURFACE ANTIGEN: Hepatitis B Surface Ag: NONREACTIVE

## 2020-05-01 LAB — HEPATITIS C ANTIBODY: HCV Ab: NONREACTIVE

## 2020-05-01 LAB — IRON AND TIBC
Iron: 125 ug/dL (ref 28–170)
Saturation Ratios: 24 % (ref 10.4–31.8)
TIBC: 529 ug/dL — ABNORMAL HIGH (ref 250–450)
UIBC: 404 ug/dL

## 2020-05-01 LAB — HEPATITIS B CORE ANTIBODY, TOTAL: Hep B Core Total Ab: NONREACTIVE

## 2020-05-01 LAB — HEPATITIS A ANTIBODY, TOTAL: hep A Total Ab: NONREACTIVE

## 2020-05-01 LAB — FERRITIN: Ferritin: 120 ng/mL (ref 11–307)

## 2020-05-02 ENCOUNTER — Ambulatory Visit (HOSPITAL_COMMUNITY): Payer: Medicare Other | Admitting: Anesthesiology

## 2020-05-02 ENCOUNTER — Ambulatory Visit (HOSPITAL_COMMUNITY)
Admission: RE | Admit: 2020-05-02 | Discharge: 2020-05-02 | Disposition: A | Discharge status: Home / Self Care | Payer: Medicare Other | Attending: Internal Medicine | Admitting: Internal Medicine

## 2020-05-02 ENCOUNTER — Other Ambulatory Visit: Payer: Self-pay

## 2020-05-02 ENCOUNTER — Encounter (HOSPITAL_COMMUNITY)
Admission: RE | Disposition: A | Discharge status: Home / Self Care | Payer: Self-pay | Source: Home / Self Care | Attending: Internal Medicine

## 2020-05-02 DIAGNOSIS — K5520 Angiodysplasia of colon without hemorrhage: Secondary | ICD-10-CM | POA: Diagnosis not present

## 2020-05-02 DIAGNOSIS — Z87891 Personal history of nicotine dependence: Secondary | ICD-10-CM | POA: Insufficient documentation

## 2020-05-02 DIAGNOSIS — Z79899 Other long term (current) drug therapy: Secondary | ICD-10-CM | POA: Diagnosis not present

## 2020-05-02 DIAGNOSIS — D122 Benign neoplasm of ascending colon: Secondary | ICD-10-CM | POA: Diagnosis not present

## 2020-05-02 DIAGNOSIS — Z7984 Long term (current) use of oral hypoglycemic drugs: Secondary | ICD-10-CM | POA: Diagnosis not present

## 2020-05-02 DIAGNOSIS — D124 Benign neoplasm of descending colon: Secondary | ICD-10-CM | POA: Diagnosis not present

## 2020-05-02 DIAGNOSIS — K648 Other hemorrhoids: Secondary | ICD-10-CM | POA: Insufficient documentation

## 2020-05-02 DIAGNOSIS — K5730 Diverticulosis of large intestine without perforation or abscess without bleeding: Secondary | ICD-10-CM | POA: Insufficient documentation

## 2020-05-02 DIAGNOSIS — R195 Other fecal abnormalities: Secondary | ICD-10-CM | POA: Diagnosis not present

## 2020-05-02 DIAGNOSIS — Z886 Allergy status to analgesic agent status: Secondary | ICD-10-CM | POA: Insufficient documentation

## 2020-05-02 HISTORY — PX: POLYPECTOMY: SHX5525

## 2020-05-02 HISTORY — PX: COLONOSCOPY WITH PROPOFOL: SHX5780

## 2020-05-02 LAB — GLUCOSE, CAPILLARY: Glucose-Capillary: 141 mg/dL — ABNORMAL HIGH (ref 70–99)

## 2020-05-02 LAB — SARS CORONAVIRUS 2 (TAT 6-24 HRS): SARS Coronavirus 2: NEGATIVE

## 2020-05-02 SURGERY — COLONOSCOPY WITH PROPOFOL
Anesthesia: General

## 2020-05-02 MED ORDER — CHLORHEXIDINE GLUCONATE CLOTH 2 % EX PADS: 6.0000 | MEDICATED_PAD | Freq: Once | CUTANEOUS | Status: DC

## 2020-05-02 MED ORDER — PROPOFOL 10 MG/ML IV BOLUS
INTRAVENOUS | Status: DC | PRN
  Administered 2020-05-02: 20 mg via INTRAVENOUS
  Administered 2020-05-02: 50 mg via INTRAVENOUS

## 2020-05-02 MED ORDER — LACTATED RINGERS IV SOLN: INTRAVENOUS | Status: DC | PRN

## 2020-05-02 MED ORDER — LACTATED RINGERS IV SOLN: INTRAVENOUS | Status: DC

## 2020-05-02 MED ORDER — PROPOFOL 500 MG/50ML IV EMUL
INTRAVENOUS | Status: DC | PRN
  Administered 2020-05-02: 150 ug/kg/min via INTRAVENOUS

## 2020-05-02 NOTE — Telephone Encounter (Signed)
The patient had a lot of labs drawn yesterday at the hospital. I called the hospital lab and spoke with Caryl Pina and Lovena Le and they stated they did not have the blood in the lab anymore. I called [facility] and spoke with Nicole Kindred and he said they did not have any blood in their lab either and that it may still be with the courier. He asked me to call back in the morning to see if they have received it.

## 2020-05-02 NOTE — H&P (Signed)
Primary Care Physician:  Bridget Hartshorn, NP Primary Gastroenterologist:  Dr. Abbey Chatters  Pre-Procedure History & Physical: HPI:  Annette Gibson is a 64 y.o. female is here for a colonoscopy to be performed for positive Cologuard testing.  Patient denies any family history of colorectal cancer.  No melena or hematochezia.  No abdominal pain or unintentional weight loss.  No change in bowel habits.  Overall feels well from a GI standpoint.  Past Medical History:  Diagnosis Date  . Anal warts    anal warts  . Anxiety   . CKD (chronic kidney disease)   . Congestive heart failure (CHF) ([place])   . COPD (chronic obstructive pulmonary disease) ([place])   . Diabetes mellitus without complication ([place])   . Hypertriglyceridemia   . Obesity    metabolic syndrome   . Osteopenia   . Vitamin D deficiency     Past Surgical History:  Procedure Laterality Date  . ABDOMINAL HYSTERECTOMY    . CATARACT EXTRACTION Bilateral   . Excision of perineal condyloma acuminata  01/12/2020  . FRACTURE SURGERY     Right leg and ankle  . lt ganglion cyst removal Bilateral 09/2007    Prior to Admission medications   Medication Sig Start Date End Date Taking? Authorizing Provider  albuterol (PROVENTIL HFA;VENTOLIN HFA) 108 (90 Base) MCG/ACT inhaler Inhale 1 puff into the lungs every 6 (six) hours as needed for wheezing or shortness of breath. Patient taking differently: Inhale 2 puffs into the lungs every 6 (six) hours as needed for wheezing or shortness of breath.  05/28/17  Yes Emokpae, Courage, MD  albuterol (PROVENTIL) (2.5 MG/3ML) 0.083% nebulizer solution Take 3 mLs (2.5 mg total) by nebulization every 6 (six) hours as needed for wheezing or shortness of breath. 05/28/17  Yes Emokpae, Courage, MD  atorvastatin (LIPITOR) 40 MG tablet Take 1 tablet (40 mg total) by mouth daily at 6 PM. Patient taking differently: Take 40 mg by mouth at bedtime. Take 1 tablet (40 mg total) by mouth daily at 6 PM. 01/20/18  Yes  Hassell Done, Mary-Margaret, FNP  buPROPion [facility] SR) 150 MG 12 hr tablet Take 150 mg by mouth daily.   Yes [provider]  carvedilol (COREG) 3.125 MG tablet Take 1 tablet (3.125 mg total) by mouth 2 (two) times daily with a meal. 01/20/18  Yes Hassell Done, Mary-Margaret, FNP  fenofibrate (TRICOR) 145 MG tablet Take 1 tablet (145 mg total) by mouth daily. Patient taking differently: Take 145 mg by mouth at bedtime.  01/20/18  Yes Hassell Done, Mary-Margaret, FNP  furosemide (LASIX) 40 MG tablet 1 tablet 40 mg every morning and 1/2 tablet (20 mg) every evening Patient taking differently: Take 20-40 mg by mouth See admin instructions. Take  40 mg every morning and 20 mg every evening 04/06/18  Yes Martin, Mary-Margaret, FNP  glipiZIDE (GLUCOTROL) 5 MG tablet Take 5 mg by mouth daily. 04/12/20  Yes [provider]  metFORMIN (GLUCOPHAGE) 1000 MG tablet TAKE 1 TABLET BY MOUTH TWICE DAILY WITH A MEAL Patient taking differently: Take 500 mg by mouth 2 (two) times daily with a meal.  01/20/18  Yes Hassell Done, Mary-Margaret, FNP  Multiple Vitamins-Minerals (HAIR SKIN AND NAILS FORMULA) TABS Take 1 tablet by mouth daily.   Yes [provider]    Allergies as of 02/11/2020 - Review Complete 02/11/2020  Allergen Reaction Noted  . Celebrex [celecoxib] Other (See Comments) 10/23/2010  . Voltaren [diclofenac sodium] Other (See Comments) 05/06/2014    Family History  Problem  Relation Age of Onset  . Pneumonia Mother   . Diabetes Mother   . Colon cancer Neg Hx     Social History   Socioeconomic History  . Marital status: Single    Spouse name: Not on file  . Number of children: Not on file  . Years of education: Not on file  . Highest education level: Not on file  Occupational History  . Not on file  Tobacco Use  . Smoking status: Former Smoker    Packs/day: 0.50    Years: 50.00    Pack years: 25.00    Types: Cigarettes    Quit date: 06/04/2019    Years since quitting: 0.9  .  Smokeless tobacco: Never Used  Vaping Use  . Vaping Use: Never used  Substance and Sexual Activity  . Alcohol use: No  . Drug use: No  . Sexual activity: Yes  Other Topics Concern  . Not on file  Social History Narrative  . Not on file   Social Determinants of Health   Financial Resource Strain:   . Difficulty of Paying Living Expenses: Not on file  Food Insecurity:   . Worried About Charity fundraiser in the Last Year: Not on file  . Ran Out of Food in the Last Year: Not on file  Transportation Needs:   . Lack of Transportation (Medical): Not on file  . Lack of Transportation (Non-Medical): Not on file  Physical Activity:   . Days of Exercise per Week: Not on file  . Minutes of Exercise per Session: Not on file  Stress:   . Feeling of Stress : Not on file  Social Connections:   . Frequency of Communication with Friends and Family: Not on file  . Frequency of Social Gatherings with Friends and Family: Not on file  . Attends Religious Services: Not on file  . Active Member of Clubs or Organizations: Not on file  . Attends Archivist Meetings: Not on file  . Marital Status: Not on file  Intimate Partner Violence:   . Fear of Current or Ex-Partner: Not on file  . Emotionally Abused: Not on file  . Physically Abused: Not on file  . Sexually Abused: Not on file    Review of Systems: See HPI, otherwise negative ROS  Impression/Plan: Annette Gibson is here for a colonoscopy to be performed for positive Cologuard testing  The risks of the procedure including infection, bleed, or perforation as well as benefits, limitations, alternatives and imponderables have been reviewed with the patient. Questions have been answered. All parties agreeable.

## 2020-05-02 NOTE — Discharge Instructions (Addendum)
Colonoscopy Discharge Instructions  Read the instructions outlined below and refer to this sheet in the next few weeks. These discharge instructions provide you with general information on caring for yourself after you leave the hospital. Your doctor may also give you specific instructions. While your treatment has been planned according to the most current medical practices available, unavoidable complications occasionally occur.   ACTIVITY  You may resume your regular activity, but move at a slower pace for the next 24 hours.   Take frequent rest periods for the next 24 hours.    Walking will help get rid of the air and reduce the bloated feeling in your belly (abdomen).   No driving for 24 hours (because of the medicine (anesthesia) used during the test).    Do not sign any important legal documents or operate any machinery for 24 hours (because of the anesthesia used during the test).  NUTRITION  Drink plenty of fluids.   You may resume your normal diet as instructed by your doctor.   Begin with a light meal and progress to your normal diet. Heavy or fried foods are harder to digest and may make you feel sick to your stomach (nauseated).   Avoid alcoholic beverages for 24 hours or as instructed.  MEDICATIONS  You may resume your normal medications unless your doctor tells you otherwise.  WHAT YOU CAN EXPECT TODAY  Some feelings of bloating in the abdomen.   Passage of more gas than usual.   Spotting of blood in your stool or on the toilet paper.  IF YOU HAD POLYPS REMOVED DURING THE COLONOSCOPY:  No aspirin products for 7 days or as instructed.   No alcohol for 7 days or as instructed.   Eat a soft diet for the next 24 hours.  FINDING OUT THE RESULTS OF YOUR TEST Not all test results are available during your visit. If your test results are not back during the visit, make an appointment with your caregiver to find out the results. Do not assume everything is normal if  you have not heard from your caregiver or the medical facility. It is important for you to follow up on all of your test results.  SEEK IMMEDIATE MEDICAL ATTENTION IF:  You have more than a spotting of blood in your stool.   Your belly is swollen (abdominal distention).   You are nauseated or vomiting.   You have a temperature over 101.   You have abdominal pain or discomfort that is severe or gets worse throughout the day.   Your colonoscopy revealed 3 polyp(s) which I removed successfully. Await pathology results, my office will contact you. I recommend repeating colonoscopy in 5 years for surveillance purposes.   You also have diverticulosis and internal hemorrhoids. I would recommend increasing fiber in your diet or adding OTC Benefiber/Metamucil. Be sure to drink at least 4 to 6 glasses of water daily. Follow-up with GI in 6-8 weeks.      I hope you have a great rest of your week!  Elon Alas. Abbey Chatters, D.O. Gastroenterology and Hepatology [facility]   Colon Polyps  Polyps are tissue growths inside the body. Polyps can grow in many places, including the large intestine (colon). A polyp may be a round bump or a mushroom-shaped growth. You could have one polyp or several. Most colon polyps are noncancerous (benign). However, some colon polyps can become cancerous over time. Finding and removing the polyps early can help prevent this. What are the  causes? The exact cause of colon polyps is not known. What increases the risk? You are more likely to develop this condition if you:  Have a family history of colon cancer or colon polyps.  Are older than 25 or older than 45 if you are African American.  Have inflammatory bowel disease, such as ulcerative colitis or Crohn's disease.  Have certain hereditary conditions, such as: ? Familial adenomatous polyposis. ? Lynch syndrome. ? Turcot syndrome. ? Peutz-Jeghers syndrome.  Are overweight.  Smoke  cigarettes.  Do not get enough exercise.  Drink too much alcohol.  Eat a diet that is high in fat and red meat and low in fiber.  Had childhood cancer that was treated with abdominal radiation. What are the signs or symptoms? Most polyps do not cause symptoms. If you have symptoms, they may include:  Blood coming from your rectum when having a bowel movement.  Blood in your stool. The stool may look dark red or black.  Abdominal pain.  A change in bowel habits, such as constipation or diarrhea. How is this diagnosed? This condition is diagnosed with a colonoscopy. This is a procedure in which a lighted, flexible scope is inserted into the anus and then passed into the colon to examine the area. Polyps are sometimes found when a colonoscopy is done as part of routine cancer screening tests. How is this treated? Treatment for this condition involves removing any polyps that are found. Most polyps can be removed during a colonoscopy. Those polyps will then be tested for cancer. Additional treatment may be needed depending on the results of testing. Follow these instructions at home: Lifestyle  Maintain a healthy weight, or lose weight if recommended by your health care provider.  Exercise every day or as told by your health care provider.  Do not use any products that contain nicotine or tobacco, such as cigarettes and e-cigarettes. If you need help quitting, ask your health care provider.  If you drink alcohol, limit how much you have: ? 0-1 drink a day for women. ? 0-2 drinks a day for men.  Be aware of how much alcohol is in your drink. In the [place] one drink equals one 12 oz bottle of beer (355 mL), one 5 oz glass of wine (148 mL), or one 1 oz shot of hard liquor (44 mL). Eating and drinking   Eat foods that are high in fiber, such as fruits, vegetables, and whole grains.  Eat foods that are high in calcium and vitamin D, such as milk, cheese, yogurt, eggs, liver, fish,  and broccoli.  Limit foods that are high in fat, such as fried foods and desserts.  Limit the amount of red meat and processed meat you eat, such as hot dogs, sausage, bacon, and lunch meats. General instructions  Keep all follow-up visits as told by your health care provider. This is important. ? This includes having regularly scheduled colonoscopies. ? Talk to your health care provider about when you need a colonoscopy. Contact a health care provider if:  You have new or worsening bleeding during a bowel movement.  You have new or increased blood in your stool.  You have a change in bowel habits.  You lose weight for no known reason. Summary  Polyps are tissue growths inside the body. Polyps can grow in many places, including the colon.  Most colon polyps are noncancerous (benign), but some can become cancerous over time.  This condition is diagnosed with a colonoscopy.  Treatment  for this condition involves removing any polyps that are found. Most polyps can be removed during a colonoscopy. This information is not intended to replace advice given to you by your health care provider. Make sure you discuss any questions you have with your health care provider. Document Revised: 09/04/2017 Document Reviewed: 09/04/2017 Elsevier Patient Education  [place].   Diverticulosis  Diverticulosis is a condition that develops when small pouches (diverticula) form in the wall of the large intestine (colon). The colon is where water is absorbed and stool (feces) is formed. The pouches form when the inside layer of the colon pushes through weak spots in the outer layers of the colon. You may have a few pouches or many of them. The pouches usually do not cause problems unless they become inflamed or infected. When this happens, the condition is called diverticulitis. What are the causes? The cause of this condition is not known. What increases the risk? The following factors may  make you more likely to develop this condition:  Being older than age 20. Your risk for this condition increases with age. Diverticulosis is rare among people younger than age 77. By age 17, many people have it.  Eating a low-fiber diet.  Having frequent constipation.  Being overweight.  Not getting enough exercise.  Smoking.  Taking over-the-counter pain medicines, like aspirin and ibuprofen.  Having a family history of diverticulosis. What are the signs or symptoms? In most people, there are no symptoms of this condition. If you do have symptoms, they may include:  Bloating.  Cramps in the abdomen.  Constipation or diarrhea.  Pain in the lower left side of the abdomen. How is this diagnosed? Because diverticulosis usually has no symptoms, it is most often diagnosed during an exam for other colon problems. The condition may be diagnosed by:  Using a flexible scope to examine the colon (colonoscopy).  Taking an X-ray of the colon after dye has been put into the colon (barium enema).  Having a CT scan. How is this treated? You may not need treatment for this condition. Your health care provider may recommend treatment to prevent problems. You may need treatment if you have symptoms or if you previously had diverticulitis. Treatment may include:  Eating a high-fiber diet.  Taking a fiber supplement.  Taking a live bacteria supplement (probiotic).  Taking medicine to relax your colon. Follow these instructions at home: Medicines  Take over-the-counter and prescription medicines only as told by your health care provider.  If told by your health care provider, take a fiber supplement or probiotic. Constipation prevention Your condition may cause constipation. To prevent or treat constipation, you may need to:  Drink enough fluid to keep your urine pale yellow.  Take over-the-counter or prescription medicines.  Eat foods that are high in fiber, such as beans, whole  grains, and fresh fruits and vegetables.  Limit foods that are high in fat and processed sugars, such as fried or sweet foods.  General instructions  Try not to strain when you have a bowel movement.  Keep all follow-up visits as told by your health care provider. This is important. Contact a health care provider if you:  Have pain in your abdomen.  Have bloating.  Have cramps.  Have not had a bowel movement in 3 days. Get help right away if:  Your pain gets worse.  Your bloating becomes very bad.  You have a fever or chills, and your symptoms suddenly get worse.  You  vomit.  You have bowel movements that are bloody or black.  You have bleeding from your rectum. Summary  Diverticulosis is a condition that develops when small pouches (diverticula) form in the wall of the large intestine (colon).  You may have a few pouches or many of them.  This condition is most often diagnosed during an exam for other colon problems.  Treatment may include increasing the fiber in your diet, taking supplements, or taking medicines. This information is not intended to replace advice given to you by your health care provider. Make sure you discuss any questions you have with your health care provider. Document Revised: 12/17/2018 Document Reviewed: 12/17/2018 Elsevier Patient Education  [place].  Hemorrhoids Hemorrhoids are swollen veins that may develop:  In the butt (rectum). These are called internal hemorrhoids.  Around the opening of the butt (anus). These are called external hemorrhoids. Hemorrhoids can cause pain, itching, or bleeding. Most of the time, they do not cause serious problems. They usually get better with diet changes, lifestyle changes, and other home treatments. What are the causes? This condition may be caused by:  Having trouble pooping (constipation).  Pushing hard (straining) to poop.  Watery poop (diarrhea).  Pregnancy.  Being very  overweight (obese).  Sitting for long periods of time.  Heavy lifting or other activity that causes you to strain.  Anal sex.  Riding a bike for a long period of time. What are the signs or symptoms? Symptoms of this condition include:  Pain.  Itching or soreness in the butt.  Bleeding from the butt.  Leaking poop.  Swelling in the area.  One or more lumps around the opening of your butt. How is this diagnosed? A doctor can often diagnose this condition by looking at the affected area. The doctor may also:  Do an exam that involves feeling the area with a gloved hand (digital rectal exam).  Examine the area inside your butt using a small tube (anoscope).  Order blood tests. This may be done if you have lost a lot of blood.  Have you get a test that involves looking inside the colon using a flexible tube with a camera on the end (sigmoidoscopy or colonoscopy). How is this treated? This condition can usually be treated at home. Your doctor may tell you to change what you eat, make lifestyle changes, or try home treatments. If these do not help, procedures can be done to remove the hemorrhoids or make them smaller. These may involve:  Placing rubber bands at the base of the hemorrhoids to cut off their blood supply.  Injecting medicine into the hemorrhoids to shrink them.  Shining a type of light energy onto the hemorrhoids to cause them to fall off.  Doing surgery to remove the hemorrhoids or cut off their blood supply. Follow these instructions at home: Eating and drinking   Eat foods that have a lot of fiber in them. These include whole grains, beans, nuts, fruits, and vegetables.  Ask your doctor about taking products that have added fiber (fibersupplements).  Reduce the amount of fat in your diet. You can do this by: ? Eating low-fat dairy products. ? Eating less red meat. ? Avoiding processed foods.  Drink enough fluid to keep your pee (urine) pale  yellow. Managing pain and swelling   Take a warm-water bath (sitz bath) for 20 minutes to ease pain. Do this 3-4 times a day. You may do this in a bathtub or using a portable  sitz bath that fits over the toilet.  If told, put ice on the painful area. It may be helpful to use ice between your warm baths. ? Put ice in a plastic bag. ? Place a towel between your skin and the bag. ? Leave the ice on for 20 minutes, 2-3 times a day. General instructions  Take over-the-counter and prescription medicines only as told by your doctor. ? Medicated creams and medicines may be used as told.  Exercise often. Ask your doctor how much and what kind of exercise is best for you.  Go to the bathroom when you have the urge to poop. Do not wait.  Avoid pushing too hard when you poop.  Keep your butt dry and clean. Use wet toilet paper or moist towelettes after pooping.  Do not sit on the toilet for a long time.  Keep all follow-up visits as told by your doctor. This is important. Contact a doctor if you:  Have pain and swelling that do not get better with treatment or medicine.  Have trouble pooping.  Cannot poop.  Have pain or swelling outside the area of the hemorrhoids. Get help right away if you have:  Bleeding that will not stop. Summary  Hemorrhoids are swollen veins in the butt or around the opening of the butt.  They can cause pain, itching, or bleeding.  Eat foods that have a lot of fiber in them. These include whole grains, beans, nuts, fruits, and vegetables.  Take a warm-water bath (sitz bath) for 20 minutes to ease pain. Do this 3-4 times a day. This information is not intended to replace advice given to you by your health care provider. Make sure you discuss any questions you have with your health care provider. Document Revised: 05/28/2018 Document Reviewed: 10/09/2017 Elsevier Patient Education  [place].   High-Fiber Diet Fiber, also called dietary fiber,  is a type of carbohydrate that is found in fruits, vegetables, whole grains, and beans. A high-fiber diet can have many health benefits. Your health care provider may recommend a high-fiber diet to help:  Prevent constipation. Fiber can make your bowel movements more regular.  Lower your cholesterol.  Relieve the following conditions: ? Swelling of veins in the anus (hemorrhoids). ? Swelling and irritation (inflammation) of specific areas of the digestive tract (uncomplicated diverticulosis). ? A problem of the large intestine (colon) that sometimes causes pain and diarrhea (irritable bowel syndrome, IBS).  Prevent overeating as part of a weight-loss plan.  Prevent heart disease, type 2 diabetes, and certain cancers. What is my plan? The recommended daily fiber intake in grams (g) includes:  38 g for men age 7 or younger.  30 g for men over age 80.  69 g for women age 76 or younger.  21 g for women over age 47. You can get the recommended daily intake of dietary fiber by:  Eating a variety of fruits, vegetables, grains, and beans.  Taking a fiber supplement, if it is not possible to get enough fiber through your diet. What do I need to know about a high-fiber diet?  It is better to get fiber through food sources rather than from fiber supplements. There is not a lot of research about how effective supplements are.  Always check the fiber content on the nutrition facts label of any prepackaged food. Look for foods that contain 5 g of fiber or more per serving.  Talk with a diet and nutrition specialist (dietitian) if you have  questions about specific foods that are recommended or not recommended for your medical condition, especially if those foods are not listed below.  Gradually increase how much fiber you consume. If you increase your intake of dietary fiber too quickly, you may have bloating, cramping, or gas.  Drink plenty of water. Water helps you to digest fiber. What  are tips for following this plan?  Eat a wide variety of high-fiber foods.  Make sure that half of the grains that you eat each day are whole grains.  Eat breads and cereals that are made with whole-grain flour instead of refined flour or white flour.  Eat brown rice, bulgur wheat, or millet instead of white rice.  Start the day with a breakfast that is high in fiber, such as a cereal that contains 5 g of fiber or more per serving.  Use beans in place of meat in soups, salads, and pasta dishes.  Eat high-fiber snacks, such as berries, raw vegetables, nuts, and popcorn.  Choose whole fruits and vegetables instead of processed forms like juice or sauce. What foods can I eat?  Fruits Berries. Pears. Apples. Oranges. Avocado. Prunes and raisins. Dried figs. Vegetables Sweet potatoes. Spinach. Kale. Artichokes. Cabbage. Broccoli. Cauliflower. Green peas. Carrots. Squash. Grains Whole-grain breads. Multigrain cereal. Oats and oatmeal. Brown rice. Barley. Bulgur wheat. [place]. Quinoa. Bran muffins. Popcorn. Rye wafer crackers. Meats and other proteins [place], kidney, and pinto beans. Soybeans. Split peas. Lentils. Nuts and seeds. Dairy Fiber-fortified yogurt. Beverages Fiber-fortified soy milk. Fiber-fortified orange juice. Other foods Fiber bars. The items listed above may not be a complete list of recommended foods and beverages. Contact a dietitian for more options. What foods are not recommended? Fruits Fruit juice. Cooked, strained fruit. Vegetables Fried potatoes. Canned vegetables. Well-cooked vegetables. Grains White bread. Pasta made with refined flour. White rice. Meats and other proteins Fatty cuts of meat. Fried chicken or fried fish. Dairy Milk. Yogurt. Cream cheese. Sour cream. Fats and oils Butters. Beverages Soft drinks. Other foods Cakes and pastries. The items listed above may not be a complete list of foods and beverages to avoid. Contact a dietitian for  more information. Summary  Fiber is a type of carbohydrate. It is found in fruits, vegetables, whole grains, and beans.  There are many health benefits of eating a high-fiber diet, such as preventing constipation, lowering blood cholesterol, helping with weight loss, and reducing your risk of heart disease, diabetes, and certain cancers.  Gradually increase your intake of fiber. Increasing too fast can result in cramping, bloating, and gas. Drink plenty of water while you increase your fiber.  The best sources of fiber include whole fruits and vegetables, whole grains, nuts, seeds, and beans. This information is not intended to replace advice given to you by your health care provider. Make sure you discuss any questions you have with your health care provider. Document Revised: 03/24/2017 Document Reviewed: 03/24/2017 Elsevier Patient Education  2020 [employer].

## 2020-05-02 NOTE — Anesthesia Postprocedure Evaluation (Signed)
Anesthesia Post Note  Patient: Annette Gibson. Annette Gibson  Procedure(s) Performed: COLONOSCOPY WITH PROPOFOL (N/A ) POLYPECTOMY  Patient location during evaluation: PACU Anesthesia Type: General Level of consciousness: awake Pain management: pain level controlled Vital Signs Assessment: post-procedure vital signs reviewed and stable Respiratory status: spontaneous breathing Cardiovascular status: blood pressure returned to baseline Anesthetic complications: no   No complications documented.   Last Vitals:  Vitals:   05/02/20 1345 05/02/20 1348  BP: 128/63 110/65  Pulse: 74 75  Resp: (!) 26 (!) 22  Temp:  36.7 C  SpO2: 100% 96%    Last Pain:  Vitals:   05/02/20 1348  TempSrc: Oral  PainSc: 0-No pain                 Louann Sjogren

## 2020-05-02 NOTE — Op Note (Signed)
[facility] Patient Name: Annette Gibson Procedure Date: 05/02/2020 12:57 PM MRN: [id number] Date of Birth: Apr 04, 1956 Attending MD: Elon Alas. Abbey Chatters DO CSN: [id number] Age: 64 Admit Type: Outpatient Procedure:                Colonoscopy Indications:              Positive Cologuard test Providers:                Elon Alas. Abbey Chatters, DO, Caprice Kluver, Lambert Mody, Casimer Bilis, Technician Referring MD:              Medicines:                See the Anesthesia note for documentation of the                            administered medications Complications:            No immediate complications. Estimated Blood Loss:     Estimated blood loss was minimal. Procedure:                Pre-Anesthesia Assessment:                           - The anesthesia plan was to use monitored                            anesthesia care (MAC).                           After obtaining informed consent, the colonoscope                            was passed under direct vision. Throughout the                            procedure, the patient's blood pressure, pulse, and                            oxygen saturations were monitored continuously. The                            [id number]-HQ190L ([id number]) scope was introduced through                            the anus and advanced to the the cecum, identified                            by appendiceal orifice and ileocecal valve. The                            colonoscopy was performed without difficulty. The                            patient tolerated the procedure well. The  quality                            of the bowel preparation was evaluated using the                            BBPS [facility] Bowel Preparation Scale) with scores                            of: Right Colon = 3, Transverse Colon = 3 and Left                            Colon = 3 (entire mucosa seen well with no residual                            staining,  small fragments of stool or opaque                            liquid). The total BBPS score equals 9. Scope In: 1:09:19 PM Scope Out: 1:24:37 PM Scope Withdrawal Time: 0 hours 12 minutes 27 seconds  Total Procedure Duration: 0 hours 15 minutes 18 seconds  Findings:      The perianal and digital rectal examinations were normal.      Non-bleeding internal hemorrhoids were found during endoscopy.      Multiple small-mouthed diverticula were found in the sigmoid colon and       descending colon.      A 2 mm polyp was found in the ascending colon. The polyp was sessile.       The polyp was removed with a jumbo cold forceps. Resection and retrieval       were complete.      A 5 mm polyp was found in the ascending colon. The polyp was sessile.       The polyp was removed with a cold snare. Resection and retrieval were       complete.      A 4 mm polyp was found in the descending colon. The polyp was sessile.       The polyp was removed with a cold snare. Resection and retrieval were       complete.      One medium-sized angiodysplastic lesion without bleeding was found in       the ascending colon. Impression:               - Non-bleeding internal hemorrhoids.                           - Diverticulosis in the sigmoid colon and in the                            descending colon.                           - One 2 mm polyp in the ascending colon, removed                            with a jumbo cold forceps. Resected and  retrieved.                           - One 5 mm polyp in the ascending colon, removed                            with a cold snare. Resected and retrieved.                           - One 4 mm polyp in the descending colon, removed                            with a cold snare. Resected and retrieved.                           - One non-bleeding colonic angiodysplastic lesion. Moderate Sedation:      Per Anesthesia Care Recommendation:           - Patient has a contact number  available for                            emergencies. The signs and symptoms of potential                            delayed complications were discussed with the                            patient. Return to normal activities tomorrow.                            Written discharge instructions were provided to the                            patient.                           - Resume previous diet.                           - Continue present medications.                           - Await pathology results.                           - Repeat colonoscopy in 5 years for surveillance.                           - Return to GI clinic PRN. Procedure Code(s):        --- Professional ---                           [phone], Colonoscopy, flexible; with removal of                            tumor(s), polyp(s), or other lesion(s) by snare  technique                           [id number], 59, Colonoscopy, flexible; with biopsy,                            single or multiple Diagnosis Code(s):        --- Professional ---                           K64.8, Other hemorrhoids                           K63.5, Polyp of colon                           K55.20, Angiodysplasia of colon without hemorrhage                           R19.5, Other fecal abnormalities                           K57.30, Diverticulosis of large intestine without                            perforation or abscess without bleeding CPT copyright 2019 American Medical Association. All rights reserved. The codes documented in this report are preliminary and upon coder review may  be revised to meet current compliance requirements. Elon Alas. Abbey Chatters, DO [place] Abbey Chatters, DO 05/02/2020 1:29:43 PM This report has been signed electronically. Number of Addenda: 0

## 2020-05-02 NOTE — Anesthesia Preprocedure Evaluation (Signed)
Anesthesia Evaluation  Patient identified by MRN, date of birth, ID band Patient awake    Reviewed: Allergy & Precautions, H&P , NPO status , Patient's Chart, lab work & pertinent test results, reviewed documented beta blocker date and time   Airway Mallampati: III  TM Distance: >3 FB Neck ROM: full    Dental no notable dental hx.    Pulmonary neg pulmonary ROS, former smoker,    Pulmonary exam normal breath sounds clear to auscultation       Cardiovascular Exercise Tolerance: Good hypertension, +CHF   Rhythm:regular Rate:Normal     Neuro/Psych negative neurological ROS  negative psych ROS   GI/Hepatic negative GI ROS, Neg liver ROS,   Endo/Other  negative endocrine ROSdiabetes  Renal/GU negative Renal ROS  negative genitourinary   Musculoskeletal negative musculoskeletal ROS (+)   Abdominal   Peds negative pediatric ROS (+)  Hematology negative hematology ROS (+)   Anesthesia Other Findings   Reproductive/Obstetrics negative OB ROS                             Anesthesia Physical Anesthesia Plan  ASA: III  Anesthesia Plan: General   Post-op Pain Management:    Induction:   PONV Risk Score and Plan: Propofol infusion  Airway Management Planned:   Additional Equipment:   Intra-op Plan:   Post-operative Plan:   Informed Consent: I have reviewed the patients History and Physical, chart, labs and discussed the procedure including the risks, benefits and alternatives for the proposed anesthesia with the patient or authorized representative who has indicated his/her understanding and acceptance.     Dental Advisory Given  Plan Discussed with: CRNA  Anesthesia Plan Comments:         Anesthesia Quick Evaluation

## 2020-05-02 NOTE — Transfer of Care (Signed)
Immediate Anesthesia Transfer of Care Note  Patient: Annette Gibson. [name]  Procedure(s) Performed: COLONOSCOPY WITH PROPOFOL (N/A ) POLYPECTOMY  Patient Location: PACU  Anesthesia Type:General  Level of Consciousness: awake  Airway & Oxygen Therapy: Patient Spontanous Breathing  Post-op Assessment: Report given to RN  Post vital signs: Reviewed  Last Vitals:  Vitals Value Taken Time  BP 120/105 05/02/20 1331  Temp    Pulse    Resp 22 05/02/20 1332  SpO2    Vitals shown include unvalidated device data.  Last Pain:  Vitals:   05/02/20 1306  TempSrc:   PainSc: 0-No pain      Patients Stated Pain Goal: 8 (67/12/45 8099)  Complications: No complications documented.

## 2020-05-02 NOTE — Telephone Encounter (Signed)
Reviewed labs completed 05/01/20.   LFTs are elevated with AST 47, ALT 55. This is fairly stable. She has history of intermittently elevated LFTs. LFTs had briefly normalized 03/10/20 but increased to AST 44 and ALT 53 on 10/27.   INR wnl. No immunity to Hep A. Hep B negative. Prior Hep B evaluation without immunity. Hep C negative. [facility], these labs had also been drawn with PCP and had been reviewed in care everywhere).  MELD (possible cirrhosis noted on [place]): 15 which is driven by kidney disease.   Hemoglobin improved to 12.1 from 11.2 on 10/8.   Ferritin, iron, andiron saturation wnl.     Annette Gibson: Please let patient know I have reviewed labs she had completed 11/29. LFTs continue to be mildly elevated. With the question of cirrhosis on her [place], I recommend we go ahead and complete additional blood work to evaluate this further. Please let me know if patient is agreeable to having labs completed and I will provide additional recommendations. She is also going to need vaccination to Hep A/B. Please provide Rx for this.   Julie/Annette Gibson: Please let me know if fibrosure is able to be added on to most recent blood work. If not, we can add it on to the additional labs I am needing to evaluate elevated LFTs.

## 2020-05-02 NOTE — Telephone Encounter (Signed)
NOTED

## 2020-05-03 ENCOUNTER — Other Ambulatory Visit: Payer: Self-pay

## 2020-05-03 DIAGNOSIS — K7460 Unspecified cirrhosis of liver: Secondary | ICD-10-CM

## 2020-05-03 DIAGNOSIS — R7989 Other specified abnormal findings of blood chemistry: Secondary | ICD-10-CM

## 2020-05-03 LAB — GLUCOSE, CAPILLARY: Glucose-Capillary: 174 mg/dL — ABNORMAL HIGH (ref 70–99)

## 2020-05-03 NOTE — Telephone Encounter (Signed)
noted

## 2020-05-03 NOTE — Telephone Encounter (Signed)
Yes, fibrosure can be added on to new lab orders.  Dena: Please see prior note with results and recommendations and let patient know. If she is agreeable to having labs completed, please arrange, ANA, AMA, ASMA, immunoglobulins (IgG, IgA, IgM), ceruloplasmin, alpha-1 antitrypsin phenotype, and fibrosure. Dx: Elevated LFTs  *With lab orders, please include the following information on the lab slip so fibrosis score can be calculated:   Patient information: Height: 5'4" Weight: 205 lbs Age: 64 y.o. Gender: Female

## 2020-05-03 NOTE — Telephone Encounter (Signed)
Dena, I have put in the orders for the blood work and I included the patients height, weight, age and gender on the order. Please let her know.

## 2020-05-03 NOTE — Telephone Encounter (Signed)
I spoke with Annette Gibson at [facility], he said they still do not have the blood and he is not sure what the hospital did with it and they will not be able to add it on after today. [name], can we order it with her next labs?

## 2020-05-03 NOTE — Telephone Encounter (Signed)
noted 

## 2020-05-03 NOTE — Telephone Encounter (Signed)
She should be able to have labs drawn at [facility] if needed.

## 2020-05-03 NOTE — Telephone Encounter (Signed)
FYI Phoned and advised pt of labs that needed to be drawn at [place]. Pt instructed me that she just had labs drawn Monday for surgery she had today. The pt states we could use that. I advised her the lab at [facility] had disposed of it already. Pt advised me that she cannot go to [place] because she owes them money.

## 2020-05-03 NOTE — Addendum Note (Signed)
Addended by: Claudina Lick on: 05/03/2020 02:20 PM   Modules accepted: Orders

## 2020-05-04 ENCOUNTER — Encounter (HOSPITAL_COMMUNITY): Payer: Self-pay | Admitting: Internal Medicine

## 2020-05-04 LAB — SURGICAL PATHOLOGY

## 2020-05-04 NOTE — Telephone Encounter (Signed)
Phoned and spoke with the pt regarding new labs being drawn and that she can go to [facility] to have it done. The pt advised me she is going to her kidney doctor tomorrow where they will be drawing blood and she will wait on our labs to give herself time to build back up(pt's words). I advised her I was putting them in the mail today along with her Rx for Hep A and B vaccines.

## 2020-06-28 ENCOUNTER — Ambulatory Visit: Payer: Medicare Other | Admitting: Gastroenterology

## 2020-07-11 ENCOUNTER — Inpatient Hospital Stay (HOSPITAL_COMMUNITY)
Admission: EM | Admit: 2020-07-11 | Discharge: 2020-07-31 | DRG: 871 | Disposition: A | Discharge status: Home Health | Payer: Medicare HMO | Attending: Internal Medicine | Admitting: Internal Medicine

## 2020-07-11 ENCOUNTER — Other Ambulatory Visit: Payer: Self-pay

## 2020-07-11 ENCOUNTER — Encounter (HOSPITAL_COMMUNITY): Payer: Self-pay

## 2020-07-11 ENCOUNTER — Emergency Department (HOSPITAL_COMMUNITY): Payer: Medicare HMO

## 2020-07-11 DIAGNOSIS — B953 Streptococcus pneumoniae as the cause of diseases classified elsewhere: Secondary | ICD-10-CM | POA: Diagnosis not present

## 2020-07-11 DIAGNOSIS — R0600 Dyspnea, unspecified: Secondary | ICD-10-CM

## 2020-07-11 DIAGNOSIS — E1122 Type 2 diabetes mellitus with diabetic chronic kidney disease: Secondary | ICD-10-CM | POA: Diagnosis present

## 2020-07-11 DIAGNOSIS — A403 Sepsis due to Streptococcus pneumoniae: Principal | ICD-10-CM | POA: Diagnosis present

## 2020-07-11 DIAGNOSIS — E781 Pure hyperglyceridemia: Secondary | ICD-10-CM | POA: Diagnosis present

## 2020-07-11 DIAGNOSIS — Z833 Family history of diabetes mellitus: Secondary | ICD-10-CM

## 2020-07-11 DIAGNOSIS — Z6841 Body Mass Index (BMI) 40.0 and over, adult: Secondary | ICD-10-CM | POA: Diagnosis not present

## 2020-07-11 DIAGNOSIS — I5032 Chronic diastolic (congestive) heart failure: Secondary | ICD-10-CM | POA: Diagnosis present

## 2020-07-11 DIAGNOSIS — D631 Anemia in chronic kidney disease: Secondary | ICD-10-CM | POA: Diagnosis present

## 2020-07-11 DIAGNOSIS — J9621 Acute and chronic respiratory failure with hypoxia: Secondary | ICD-10-CM | POA: Diagnosis present

## 2020-07-11 DIAGNOSIS — D62 Acute posthemorrhagic anemia: Secondary | ICD-10-CM | POA: Diagnosis present

## 2020-07-11 DIAGNOSIS — R6520 Severe sepsis without septic shock: Secondary | ICD-10-CM | POA: Diagnosis present

## 2020-07-11 DIAGNOSIS — A419 Sepsis, unspecified organism: Secondary | ICD-10-CM

## 2020-07-11 DIAGNOSIS — N179 Acute kidney failure, unspecified: Secondary | ICD-10-CM

## 2020-07-11 DIAGNOSIS — B370 Candidal stomatitis: Secondary | ICD-10-CM | POA: Diagnosis present

## 2020-07-11 DIAGNOSIS — Z7984 Long term (current) use of oral hypoglycemic drugs: Secondary | ICD-10-CM

## 2020-07-11 DIAGNOSIS — J9600 Acute respiratory failure, unspecified whether with hypoxia or hypercapnia: Secondary | ICD-10-CM

## 2020-07-11 DIAGNOSIS — I130 Hypertensive heart and chronic kidney disease with heart failure and stage 1 through stage 4 chronic kidney disease, or unspecified chronic kidney disease: Secondary | ICD-10-CM | POA: Diagnosis present

## 2020-07-11 DIAGNOSIS — E041 Nontoxic single thyroid nodule: Secondary | ICD-10-CM | POA: Diagnosis present

## 2020-07-11 DIAGNOSIS — I214 Non-ST elevation (NSTEMI) myocardial infarction: Secondary | ICD-10-CM | POA: Diagnosis present

## 2020-07-11 DIAGNOSIS — R571 Hypovolemic shock: Secondary | ICD-10-CM | POA: Diagnosis present

## 2020-07-11 DIAGNOSIS — J440 Chronic obstructive pulmonary disease with acute lower respiratory infection: Secondary | ICD-10-CM | POA: Diagnosis present

## 2020-07-11 DIAGNOSIS — J13 Pneumonia due to Streptococcus pneumoniae: Secondary | ICD-10-CM | POA: Diagnosis present

## 2020-07-11 DIAGNOSIS — R778 Other specified abnormalities of plasma proteins: Secondary | ICD-10-CM

## 2020-07-11 DIAGNOSIS — N189 Chronic kidney disease, unspecified: Secondary | ICD-10-CM

## 2020-07-11 DIAGNOSIS — Z888 Allergy status to other drugs, medicaments and biological substances status: Secondary | ICD-10-CM

## 2020-07-11 DIAGNOSIS — Z87891 Personal history of nicotine dependence: Secondary | ICD-10-CM

## 2020-07-11 DIAGNOSIS — R911 Solitary pulmonary nodule: Secondary | ICD-10-CM | POA: Diagnosis present

## 2020-07-11 DIAGNOSIS — R739 Hyperglycemia, unspecified: Secondary | ICD-10-CM

## 2020-07-11 DIAGNOSIS — F419 Anxiety disorder, unspecified: Secondary | ICD-10-CM | POA: Diagnosis present

## 2020-07-11 DIAGNOSIS — E1110 Type 2 diabetes mellitus with ketoacidosis without coma: Secondary | ICD-10-CM

## 2020-07-11 DIAGNOSIS — I2723 Pulmonary hypertension due to lung diseases and hypoxia: Secondary | ICD-10-CM | POA: Diagnosis present

## 2020-07-11 DIAGNOSIS — G4733 Obstructive sleep apnea (adult) (pediatric): Secondary | ICD-10-CM | POA: Diagnosis present

## 2020-07-11 DIAGNOSIS — Z886 Allergy status to analgesic agent status: Secondary | ICD-10-CM

## 2020-07-11 DIAGNOSIS — R7989 Other specified abnormal findings of blood chemistry: Secondary | ICD-10-CM | POA: Diagnosis present

## 2020-07-11 DIAGNOSIS — J189 Pneumonia, unspecified organism: Secondary | ICD-10-CM | POA: Diagnosis not present

## 2020-07-11 DIAGNOSIS — R0902 Hypoxemia: Secondary | ICD-10-CM

## 2020-07-11 DIAGNOSIS — J9622 Acute and chronic respiratory failure with hypercapnia: Secondary | ICD-10-CM | POA: Diagnosis present

## 2020-07-11 DIAGNOSIS — J9602 Acute respiratory failure with hypercapnia: Secondary | ICD-10-CM

## 2020-07-11 DIAGNOSIS — Z20822 Contact with and (suspected) exposure to covid-19: Secondary | ICD-10-CM | POA: Diagnosis present

## 2020-07-11 DIAGNOSIS — E876 Hypokalemia: Secondary | ICD-10-CM | POA: Diagnosis not present

## 2020-07-11 DIAGNOSIS — J9601 Acute respiratory failure with hypoxia: Secondary | ICD-10-CM

## 2020-07-11 DIAGNOSIS — R195 Other fecal abnormalities: Secondary | ICD-10-CM | POA: Diagnosis not present

## 2020-07-11 DIAGNOSIS — N184 Chronic kidney disease, stage 4 (severe): Secondary | ICD-10-CM | POA: Diagnosis present

## 2020-07-11 DIAGNOSIS — R609 Edema, unspecified: Secondary | ICD-10-CM

## 2020-07-11 DIAGNOSIS — R0603 Acute respiratory distress: Secondary | ICD-10-CM | POA: Diagnosis not present

## 2020-07-11 DIAGNOSIS — Z79899 Other long term (current) drug therapy: Secondary | ICD-10-CM

## 2020-07-11 DIAGNOSIS — Z882 Allergy status to sulfonamides status: Secondary | ICD-10-CM

## 2020-07-11 DIAGNOSIS — R7881 Bacteremia: Secondary | ICD-10-CM | POA: Diagnosis not present

## 2020-07-11 DIAGNOSIS — D509 Iron deficiency anemia, unspecified: Secondary | ICD-10-CM | POA: Diagnosis present

## 2020-07-11 DIAGNOSIS — D696 Thrombocytopenia, unspecified: Secondary | ICD-10-CM | POA: Diagnosis present

## 2020-07-11 LAB — RESP PANEL BY RT-PCR (FLU A&B, COVID) ARPGX2
Influenza A by PCR: NEGATIVE
Influenza B by PCR: NEGATIVE
SARS Coronavirus 2 by RT PCR: NEGATIVE

## 2020-07-11 LAB — CBC WITH DIFFERENTIAL/PLATELET
Abs Immature Granulocytes: 0.18 10*3/uL — ABNORMAL HIGH (ref 0.00–0.07)
Basophils Absolute: 0.1 10*3/uL (ref 0.0–0.1)
Basophils Relative: 0 %
Eosinophils Absolute: 0.3 10*3/uL (ref 0.0–0.5)
Eosinophils Relative: 3 %
HCT: 35.4 % — ABNORMAL LOW (ref 36.0–46.0)
Hemoglobin: 10.8 g/dL — ABNORMAL LOW (ref 12.0–15.0)
Immature Granulocytes: 2 %
Lymphocytes Relative: 6 %
Lymphs Abs: 0.8 10*3/uL (ref 0.7–4.0)
MCH: 30.5 pg (ref 26.0–34.0)
MCHC: 30.5 g/dL (ref 30.0–36.0)
MCV: 100 fL (ref 80.0–100.0)
Monocytes Absolute: 0.4 10*3/uL (ref 0.1–1.0)
Monocytes Relative: 4 %
Neutro Abs: 10.2 10*3/uL — ABNORMAL HIGH (ref 1.7–7.7)
Neutrophils Relative %: 85 %
Platelets: UNDETERMINED 10*3/uL (ref 150–400)
RBC: 3.54 MIL/uL — ABNORMAL LOW (ref 3.87–5.11)
RDW: 12.2 % (ref 11.5–15.5)
WBC: 11.9 10*3/uL — ABNORMAL HIGH (ref 4.0–10.5)
nRBC: 0.3 % — ABNORMAL HIGH (ref 0.0–0.2)

## 2020-07-11 LAB — PROTIME-INR
INR: 1.3 — ABNORMAL HIGH (ref 0.8–1.2)
Prothrombin Time: 15.4 seconds — ABNORMAL HIGH (ref 11.4–15.2)

## 2020-07-11 LAB — CORTISOL: Cortisol, Plasma: >100 ug/dL

## 2020-07-11 LAB — I-STAT VENOUS BLOOD GAS, ED
Acid-base deficit: 1 mmol/L (ref 0.0–2.0)
Bicarbonate: 27 mmol/L (ref 20.0–28.0)
Calcium, Ion: 1.17 mmol/L (ref 1.15–1.40)
HCT: 34 % — ABNORMAL LOW (ref 36.0–46.0)
Hemoglobin: 11.6 g/dL — ABNORMAL LOW (ref 12.0–15.0)
O2 Saturation: 85 %
Potassium: 4.8 mmol/L (ref 3.5–5.1)
Sodium: 134 mmol/L — ABNORMAL LOW (ref 135–145)
TCO2: 29 mmol/L (ref 22–32)
pCO2, Ven: 62.5 mmHg — ABNORMAL HIGH (ref 44.0–60.0)
pH, Ven: 7.243 — ABNORMAL LOW (ref 7.250–7.430)
pO2, Ven: 61 mmHg — ABNORMAL HIGH (ref 32.0–45.0)

## 2020-07-11 LAB — CBG MONITORING, ED
Glucose-Capillary: 469 mg/dL — ABNORMAL HIGH (ref 70–99)
Glucose-Capillary: 498 mg/dL — ABNORMAL HIGH (ref 70–99)
Glucose-Capillary: 454 mg/dL — ABNORMAL HIGH (ref 70–99)

## 2020-07-11 LAB — COMPREHENSIVE METABOLIC PANEL
ALT: 23 U/L (ref 0–44)
AST: 36 U/L (ref 15–41)
Albumin: 2.1 g/dL — ABNORMAL LOW (ref 3.5–5.0)
Alkaline Phosphatase: 61 U/L (ref 38–126)
Anion gap: 18 — ABNORMAL HIGH (ref 5–15)
BUN: 79 mg/dL — ABNORMAL HIGH (ref 8–23)
CO2: 23 mmol/L (ref 22–32)
Calcium: 9.1 mg/dL (ref 8.9–10.3)
Chloride: 93 mmol/L — ABNORMAL LOW (ref 98–111)
Creatinine, Ser: 4.76 mg/dL — ABNORMAL HIGH (ref 0.44–1.00)
GFR, Estimated: 10 mL/min — ABNORMAL LOW (ref >60)
Glucose, Bld: 534 mg/dL (ref 70–99)
Potassium: 4.8 mmol/L (ref 3.5–5.1)
Sodium: 134 mmol/L — ABNORMAL LOW (ref 135–145)
Total Bilirubin: 1.6 mg/dL — ABNORMAL HIGH (ref 0.3–1.2)
Total Protein: 6.1 g/dL — ABNORMAL LOW (ref 6.5–8.1)

## 2020-07-11 LAB — LACTIC ACID, PLASMA
Lactic Acid, Venous: 2.7 mmol/L (ref 0.5–1.9)
Lactic Acid, Venous: 3.1 mmol/L (ref 0.5–1.9)

## 2020-07-11 LAB — TROPONIN I (HIGH SENSITIVITY)
Troponin I (High Sensitivity): 256 ng/L (ref <18)
Troponin I (High Sensitivity): 284 ng/L (ref <18)

## 2020-07-11 LAB — HEMOGLOBIN A1C
Hgb A1c MFr Bld: 11.8 % — ABNORMAL HIGH (ref 4.8–5.6)
Mean Plasma Glucose: 291.96 mg/dL

## 2020-07-11 LAB — BETA-HYDROXYBUTYRIC ACID: Beta-Hydroxybutyric Acid: 1.84 mmol/L — ABNORMAL HIGH (ref 0.05–0.27)

## 2020-07-11 LAB — APTT: aPTT: 32 seconds (ref 24–36)

## 2020-07-11 LAB — PROCALCITONIN: Procalcitonin: 37.25 ng/mL

## 2020-07-11 LAB — BRAIN NATRIURETIC PEPTIDE: B Natriuretic Peptide: 680.1 pg/mL — ABNORMAL HIGH (ref 0.0–100.0)

## 2020-07-11 LAB — HIV ANTIBODY (ROUTINE TESTING W REFLEX): HIV Screen 4th Generation wRfx: NONREACTIVE

## 2020-07-11 MED ORDER — POLYETHYLENE GLYCOL 3350 17 G PO PACK: 17.0000 g | PACK | Freq: Every day | ORAL | Status: DC | PRN

## 2020-07-11 MED ORDER — LACTATED RINGERS IV BOLUS (SEPSIS)
250.0000 mL | Freq: Once | INTRAVENOUS | Status: AC
  Administered 2020-07-11: 250 mL via INTRAVENOUS

## 2020-07-11 MED ORDER — HEPARIN SODIUM (PORCINE) 5000 UNIT/ML IJ SOLN
5000.0000 [IU] | Freq: Three times a day (TID) | INTRAMUSCULAR | Status: DC
  Administered 2020-07-11: 5000 [IU] via SUBCUTANEOUS
  Filled 2020-07-11: qty 1

## 2020-07-11 MED ORDER — METHYLPREDNISOLONE SODIUM SUCC 125 MG IJ SOLR
125.0000 mg | Freq: Once | INTRAMUSCULAR | Status: AC
  Administered 2020-07-11: 125 mg via INTRAVENOUS
  Filled 2020-07-11: qty 2

## 2020-07-11 MED ORDER — LACTATED RINGERS IV BOLUS
500.0000 mL | Freq: Once | INTRAVENOUS | Status: AC
  Administered 2020-07-11: 500 mL via INTRAVENOUS

## 2020-07-11 MED ORDER — DOCUSATE SODIUM 100 MG PO CAPS: 100.0000 mg | ORAL_CAPSULE | Freq: Two times a day (BID) | ORAL | Status: DC | PRN

## 2020-07-11 MED ORDER — LACTATED RINGERS IV SOLN: INTRAVENOUS | Status: DC

## 2020-07-11 MED ORDER — SODIUM CHLORIDE 0.9 % IV SOLN: 1.0000 g | Freq: Two times a day (BID) | INTRAVENOUS | Status: DC

## 2020-07-11 MED ORDER — IPRATROPIUM-ALBUTEROL 0.5-2.5 (3) MG/3ML IN SOLN
RESPIRATORY_TRACT | Status: AC
  Filled 2020-07-11: qty 3

## 2020-07-11 MED ORDER — ACETAMINOPHEN 325 MG PO TABS
650.0000 mg | ORAL_TABLET | Freq: Once | ORAL | Status: AC
  Administered 2020-07-11: 650 mg via ORAL
  Filled 2020-07-11: qty 2

## 2020-07-11 MED ORDER — SODIUM CHLORIDE 0.9 % IV SOLN
2.0000 g | INTRAVENOUS | Status: AC
  Administered 2020-07-11 – 2020-07-24 (×14): 2 g via INTRAVENOUS
  Filled 2020-07-11: qty 20
  Filled 2020-07-11 (×2): qty 2
  Filled 2020-07-11 (×9): qty 20
  Filled 2020-07-11 (×2): qty 2
  Filled 2020-07-11: qty 20

## 2020-07-11 MED ORDER — IPRATROPIUM-ALBUTEROL 0.5-2.5 (3) MG/3ML IN SOLN
3.0000 mL | Freq: Once | RESPIRATORY_TRACT | Status: AC
  Administered 2020-07-11: 3 mL via RESPIRATORY_TRACT
  Filled 2020-07-11: qty 3

## 2020-07-11 MED ORDER — IPRATROPIUM-ALBUTEROL 0.5-2.5 (3) MG/3ML IN SOLN: 3.0000 mL | Freq: Four times a day (QID) | RESPIRATORY_TRACT | Status: DC | PRN

## 2020-07-11 MED ORDER — INSULIN ASPART 100 UNIT/ML ~~LOC~~ SOLN
8.0000 [IU] | Freq: Once | SUBCUTANEOUS | Status: AC
  Administered 2020-07-11: 8 [IU] via SUBCUTANEOUS

## 2020-07-11 MED ORDER — LACTATED RINGERS IV BOLUS (SEPSIS)
1000.0000 mL | Freq: Once | INTRAVENOUS | Status: AC
  Administered 2020-07-11: 1000 mL via INTRAVENOUS

## 2020-07-11 MED ORDER — INSULIN ASPART 100 UNIT/ML ~~LOC~~ SOLN: 0.0000 [IU] | SUBCUTANEOUS | Status: DC

## 2020-07-11 MED ORDER — SODIUM CHLORIDE 0.9 % IV SOLN
500.0000 mg | INTRAVENOUS | Status: DC
  Administered 2020-07-11: 500 mg via INTRAVENOUS
  Filled 2020-07-11 (×3): qty 500

## 2020-07-11 MED ORDER — LACTATED RINGERS IV BOLUS (SEPSIS)
500.0000 mL | Freq: Once | INTRAVENOUS | Status: AC
  Administered 2020-07-11: 500 mL via INTRAVENOUS

## 2020-07-11 MED ORDER — ACETAMINOPHEN 325 MG PO TABS
650.0000 mg | ORAL_TABLET | ORAL | Status: DC | PRN
  Administered 2020-07-14 – 2020-07-23 (×5): 650 mg via ORAL
  Filled 2020-07-11 (×5): qty 2

## 2020-07-11 NOTE — ED Provider Notes (Signed)
I provided a substantive portion of the care of this patient.  I personally performed the entirety of the history for this encounter.  EKG Interpretation  Date/Time:  Tuesday July 11 2020 17:07:36 EST Ventricular Rate:  109 PR Interval:    QRS Duration: 98 QT Interval:  334 QTC Calculation: 450 R Axis:   78 Text Interpretation: Sinus tachycardia Low voltage, precordial leads no sig change from previous Confirmed by Charlesetta Shanks [phone]) on 07/11/2020 5:27:37 PM  Patient presents with increasing shortness of breath. She is also found to be febrile. Patient reports at home she typically uses oxygen but has not had any available for the past couple months. She does have known history of COPD. Patient was seen in the emergency department yesterday for elevated blood sugars. Note also indicates she had confusion. Patient was seen at her PCP office today and referred to the emergency department with hypoxia, fever and tachypnea. Reportedly patient had negative testing yesterday for Covid and influenza.  Patient is alert. She has moderate increased work of breathing. She has audible crackles. She is speaking in full short sentences. She is situationally oriented. Heart is regular, tachycardic. Gross crackles throughout the lung fields. Abdomen soft and nontender. Trace peripheral edema. At this time, patient is following commands. She is not obtunded but perhaps mildly confused. No focal neuro deficits.  Patient does present with critical illness. She has significant hypoxia which has improved to 90% on nonrebreather mask. She is tachypneic and has crackles throughout the lung fields. Patient is febrile to 102 and tachycardic. At this time, differential diagnosis includes sepsis with possible pneumonia, COPD exacerbation, congestive heart failure, viral illness such as Covid although, recent testing negative. We will proceed with BiPAP for hypoxia and tachypnea. Will initiate DuoNeb therapy. Patient's  chest x-ray shows significant infiltrate on the right, diffusely through much of the lung field. Will start sepsis protocol. Patient will need admission. At this time, will need careful monitoring for possible declining respiratory status with hypoxia, significant unilateral pneumonia and comorbid COPD and CHF. At this time, patient is stable for BiPAP with a clear mental status and awake without apparent risk of aspiration.  CRITICAL CARE Performed by: Charlesetta Shanks   Total critical care time: 30 minutes  Critical care time was exclusive of separately billable procedures and treating other patients.  Critical care was necessary to treat or prevent imminent or life-threatening deterioration.  Critical care was time spent personally by me on the following activities: development of treatment plan with patient and/or surrogate as well as nursing, discussions with consultants, evaluation of patient's response to treatment, examination of patient, obtaining history from patient or surrogate, ordering and performing treatments and interventions, ordering and review of laboratory studies, ordering and review of radiographic studies, pulse oximetry and re-evaluation of patient's condition.   Charlesetta Shanks, MD 07/11/20 [phone]

## 2020-07-11 NOTE — ED Triage Notes (Signed)
Pt BIB [facility] from PCP d/t SOB & DKA. Her CBG at PCP was 550. She has a Hx of CHF, and was recently tested negative for Flu & Covid. EMS reports her O2 was 70% on RA & 90 on NRB. Pt was given 1 Nitro while in route to ED & her BP went to 88/50 & then bounced back up to 140/80 All other VS: 110 bpm, 90% NRB continues, cap is 35 & GCS 15. Upon arrival to ED her aux temp is 102 & CBG was retested at 469.

## 2020-07-11 NOTE — Sepsis Progress Note (Signed)
Notified bedside nurse of need to administer antibiotics.  

## 2020-07-11 NOTE — Sepsis Progress Note (Signed)
Sepsis monitoring complete. Awaiting arrival to ICU.

## 2020-07-11 NOTE — ED Notes (Signed)
Lactic acid 2.7, notified primary RN and MD

## 2020-07-11 NOTE — ED Provider Notes (Signed)
[facility] EMERGENCY DEPARTMENT Provider Note   CSN: [id number] Arrival date & time: 07/11/20  1703     History Chief Complaint  Patient presents with  . Congestive Heart Failure  . DKA  . Hypoxic    Annette Gibson is a 65 y.o. female.  HPI    65 year old female with a history of anxiety, CKD, COPD, CHF, diabetes, hypertriglyceridemia, obesity, who presents the emergency department today for evaluation of respiratory failure.  Patient states she is been short of breath for the last week.  States she is normally on 3 L O2 at home but ran out.  She also states she has not taken any of her COPD or other medications for a week because she has not felt well.  She has had a cough and shortness of breath for about a week.  She denies any chest pain.  She also reports diarrhea.  Denies any vomiting or abdominal pain.  Denies urinary symptoms.  Review of records.  Patient was seen at [facility] ED yesterday with elevated blood sugars.  She was found not have any evidence of DKA.  She was given IV fluids and blood sugars improved overnight.  She was discharged this morning to follow-up with PCP. She had a neg covid/flu test yesterday.  Per EMS on arrival patient sats were in the 70s.  They are able to improve sats to 90% on a nonrebreather.  They gave a dose of nitroglycerin in route and patient had an episode of hypotension which was short-lived.  Past Medical History:  Diagnosis Date  . Anal warts    anal warts  . Anxiety   . CKD (chronic kidney disease)   . Congestive heart failure (CHF) ([place])   . COPD (chronic obstructive pulmonary disease) ([place])   . Diabetes mellitus without complication ([place])   . Hypertriglyceridemia   . Obesity    metabolic syndrome   . Osteopenia   . Vitamin D deficiency     Patient Active Problem List   Diagnosis Date Noted  . Acute hypoxemic respiratory failure ([place]) 07/11/2020  . Elevated ALT measurement 02/13/2020  . Positive  colorectal cancer screening using Cologuard test 02/11/2020  . Elevated LFTs 02/11/2020  . CHF, acute on chronic ([place]) 05/23/2017  . Hypokalemia 05/23/2017  . BMI 38.0-38.9,adult 12/08/2015  . Hypertension 10/09/2012  . Hyperlipidemia 10/09/2012  . Peripheral edema 10/09/2012  . COPD (chronic obstructive pulmonary disease) ([place]) 10/09/2012  . GAD (generalized anxiety disorder) 10/09/2012  . Vitamin D deficiency 10/09/2012  . Diabetes ([place]) 10/09/2012  . Osteopenia 10/09/2012    Past Surgical History:  Procedure Laterality Date  . ABDOMINAL HYSTERECTOMY    . CATARACT EXTRACTION Bilateral   . COLONOSCOPY WITH PROPOFOL N/A 05/02/2020   Procedure: COLONOSCOPY WITH PROPOFOL;  Surgeon: Eloise Harman, DO;  Location: [facility];  Service: Endoscopy;  Laterality: N/A;  8:30am  . Excision of perineal condyloma acuminata  01/12/2020  . FRACTURE SURGERY     Right leg and ankle  . lt ganglion cyst removal Bilateral 09/2007  . POLYPECTOMY  05/02/2020   Procedure: POLYPECTOMY;  Surgeon: Eloise Harman, DO;  Location: [facility];  Service: Endoscopy;;     OB History   No obstetric history on file.     Family History  Problem Relation Age of Onset  . Pneumonia Mother   . Diabetes Mother   . Colon cancer Neg Hx     Social History   Tobacco Use  .  Smoking status: Former Smoker    Packs/day: 0.50    Years: 50.00    Pack years: 25.00    Types: Cigarettes    Quit date: 06/04/2019    Years since quitting: 1.1  . Smokeless tobacco: Never Used  Vaping Use  . Vaping Use: Never used  Substance Use Topics  . Alcohol use: No  . Drug use: No    Home Medications Prior to Admission medications   Medication Sig Start Date End Date Taking? Authorizing Provider  albuterol (PROVENTIL HFA;VENTOLIN HFA) 108 (90 Base) MCG/ACT inhaler Inhale 1 puff into the lungs every 6 (six) hours as needed for wheezing or shortness of breath. Patient taking differently: Inhale 2 puffs into the  lungs every 6 (six) hours as needed for wheezing or shortness of breath.  05/28/17   Roxan Hockey, MD  albuterol (PROVENTIL) (2.5 MG/3ML) 0.083% nebulizer solution Take 3 mLs (2.5 mg total) by nebulization every 6 (six) hours as needed for wheezing or shortness of breath. 05/28/17   Roxan Hockey, MD  atorvastatin (LIPITOR) 40 MG tablet Take 1 tablet (40 mg total) by mouth daily at 6 PM. Patient taking differently: Take 40 mg by mouth at bedtime. Take 1 tablet (40 mg total) by mouth daily at 6 PM. 01/20/18   Hassell Done, Mary-Margaret, FNP  buPROPion [facility] SR) 150 MG 12 hr tablet Take 150 mg by mouth daily.    [provider]  carvedilol (COREG) 3.125 MG tablet Take 1 tablet (3.125 mg total) by mouth 2 (two) times daily with a meal. 01/20/18   Hassell Done, Mary-Margaret, FNP  fenofibrate (TRICOR) 145 MG tablet Take 1 tablet (145 mg total) by mouth daily. Patient taking differently: Take 145 mg by mouth at bedtime.  01/20/18   Hassell Done, Mary-Margaret, FNP  furosemide (LASIX) 40 MG tablet 1 tablet 40 mg every morning and 1/2 tablet (20 mg) every evening Patient taking differently: Take 20-40 mg by mouth See admin instructions. Take  40 mg every morning and 20 mg every evening 04/06/18   Hassell Done, Mary-Margaret, FNP  glipiZIDE (GLUCOTROL) 5 MG tablet Take 5 mg by mouth daily. 04/12/20   [provider]  metFORMIN (GLUCOPHAGE) 1000 MG tablet TAKE 1 TABLET BY MOUTH TWICE DAILY WITH A MEAL Patient taking differently: Take 500 mg by mouth 2 (two) times daily with a meal.  01/20/18   Hassell Done, Mary-Margaret, FNP  Multiple Vitamins-Minerals (HAIR SKIN AND NAILS FORMULA) TABS Take 1 tablet by mouth daily.    [provider]    Allergies    Celebrex [celecoxib], Sulfa antibiotics, and Voltaren [diclofenac sodium]  Review of Systems   Review of Systems  Constitutional: Positive for fever.  HENT: Negative for ear pain and sore throat.   Eyes: Negative for visual disturbance.   Respiratory: Positive for cough and shortness of breath.   Cardiovascular: Negative for chest pain.  Gastrointestinal: Positive for diarrhea. Negative for abdominal pain, constipation, nausea and vomiting.  Genitourinary: Negative for dysuria and hematuria.  Musculoskeletal: Negative for back pain.  Skin: Negative for rash.  Neurological: Negative for headaches.  All other systems reviewed and are negative.   Physical Exam Updated Vital Signs BP (!) 115/97   Pulse 86   Temp 98.4 F (36.9 C) (Oral)   Resp (!) 32   SpO2 93%   Physical Exam Vitals and nursing note reviewed.  Constitutional:      General: She is not in acute distress.    Appearance: She is well-developed and well-nourished.  HENT:  Head: Normocephalic and atraumatic.  Eyes:     Conjunctiva/sclera: Conjunctivae normal.  Cardiovascular:     Rate and Rhythm: Normal rate and regular rhythm.     Heart sounds: No murmur heard.   Pulmonary:     Effort: Respiratory distress present.     Breath sounds: Rales present.     Comments: Tachypneic, speaking in 1-2 word sentences Abdominal:     General: Bowel sounds are normal.     Palpations: Abdomen is soft.     Tenderness: There is no abdominal tenderness. There is no guarding or rebound.  Musculoskeletal:        General: No edema.     Cervical back: Neck supple.  Skin:    General: Skin is warm and dry.  Neurological:     Mental Status: She is alert.  Psychiatric:        Mood and Affect: Mood and affect normal.     ED Results / Procedures / Treatments   Labs (all labs ordered are listed, but only abnormal results are displayed) Labs Reviewed  CBC WITH DIFFERENTIAL/PLATELET - Abnormal; Notable for the following components:      Result Value   WBC 11.9 (*)    RBC 3.54 (*)    Hemoglobin 10.8 (*)    HCT 35.4 (*)    nRBC 0.3 (*)    Neutro Abs 10.2 (*)    Abs Immature Granulocytes 0.18 (*)    All other components within normal limits  COMPREHENSIVE  METABOLIC PANEL - Abnormal; Notable for the following components:   Sodium 134 (*)    Chloride 93 (*)    Glucose, Bld 534 (*)    BUN 79 (*)    Creatinine, Ser 4.76 (*)    Total Protein 6.1 (*)    Albumin 2.1 (*)    Total Bilirubin 1.6 (*)    GFR, Estimated 10 (*)    Anion gap 18 (*)    All other components within normal limits  LACTIC ACID, PLASMA - Abnormal; Notable for the following components:   Lactic Acid, Venous 2.7 (*)    All other components within normal limits  LACTIC ACID, PLASMA - Abnormal; Notable for the following components:   Lactic Acid, Venous 3.1 (*)    All other components within normal limits  BRAIN NATRIURETIC PEPTIDE - Abnormal; Notable for the following components:   B Natriuretic Peptide 680.1 (*)    All other components within normal limits  PROTIME-INR - Abnormal; Notable for the following components:   Prothrombin Time 15.4 (*)    INR 1.3 (*)    All other components within normal limits  CBG MONITORING, ED - Abnormal; Notable for the following components:   Glucose-Capillary 469 (*)    All other components within normal limits  I-STAT VENOUS BLOOD GAS, ED - Abnormal; Notable for the following components:   pH, Ven 7.243 (*)    pCO2, Ven 62.5 (*)    pO2, Ven 61.0 (*)    Sodium 134 (*)    HCT 34.0 (*)    Hemoglobin 11.6 (*)    All other components within normal limits  TROPONIN I (HIGH SENSITIVITY) - Abnormal; Notable for the following components:   Troponin I (High Sensitivity) 256 (*)    All other components within normal limits  TROPONIN I (HIGH SENSITIVITY) - Abnormal; Notable for the following components:   Troponin I (High Sensitivity) 284 (*)    All other components within normal limits  RESP PANEL BY RT-PCR (  FLU A&B, COVID) ARPGX2  CULTURE, BLOOD (ROUTINE X 2)  CULTURE, BLOOD (ROUTINE X 2)  URINE CULTURE  GASTROINTESTINAL PANEL BY PCR, STOOL (REPLACES STOOL CULTURE)  C DIFFICILE QUICK SCREEN W PCR REFLEX  APTT  URINALYSIS, ROUTINE W  REFLEX MICROSCOPIC  HIV ANTIBODY (ROUTINE TESTING W REFLEX)  PROCALCITONIN  CORTISOL  STREP PNEUMONIAE URINARY ANTIGEN  LEGIONELLA PNEUMOPHILA SEROGP 1 UR AG  CBC  BASIC METABOLIC PANEL  MAGNESIUM  PHOSPHORUS  BETA-HYDROXYBUTYRIC ACID  HEMOGLOBIN A1C    EKG EKG Interpretation  Date/Time:  Tuesday July 11 2020 17:07:36 EST Ventricular Rate:  109 PR Interval:    QRS Duration: 98 QT Interval:  334 QTC Calculation: 450 R Axis:   78 Text Interpretation: Sinus tachycardia Low voltage, precordial leads no sig change from previous Confirmed by Charlesetta Shanks [phone]) on 07/11/2020 5:27:37 PM   Radiology DG Chest Portable 1 View  Result Date: 07/11/2020 CLINICAL DATA:  Initial evaluation for acute cough. History of COPD, CHF. EXAM: PORTABLE CHEST 1 VIEW COMPARISON:  Prior radiograph from 05/23/2017. FINDINGS: Cardiac and mediastinal silhouettes are stable in size and contour, and remain within normal limits. Lungs are hypoinflated. Hazy and patchy opacity seen throughout the right mid and lower lung, concerning for multifocal pneumonia. Left lung is largely clear. No pulmonary edema or visible pleural effusion. No pneumothorax. No acute osseous finding. IMPRESSION: Hazy and patchy opacity throughout the right mid and lower lung, concerning for multifocal pneumonia. Electronically Signed   By: Jeannine Boga M.D.   On: 07/11/2020 18:05    Procedures Procedures   CRITICAL CARE Performed by: Rodney Booze   Total critical care time: 47 minutes  Critical care time was exclusive of separately billable procedures and treating other patients.  Critical care was necessary to treat or prevent imminent or life-threatening deterioration.  Critical care was time spent personally by me on the following activities: development of treatment plan with patient and/or surrogate as well as nursing, discussions with consultants, evaluation of patient's response to treatment, examination of  patient, obtaining history from patient or surrogate, ordering and performing treatments and interventions, ordering and review of laboratory studies, ordering and review of radiographic studies, pulse oximetry and re-evaluation of patient's condition.   Medications Ordered in ED Medications  ipratropium-albuterol (DUONEB) 0.5-2.5 (3) MG/3ML nebulizer solution (  Canceled Entry 07/11/20 1731)  cefTRIAXone (ROCEPHIN) 2 g in sodium chloride 0.9 % 100 mL IVPB (0 g Intravenous Stopped 07/11/20 1851)  azithromycin (ZITHROMAX) 500 mg in sodium chloride 0.9 % 250 mL IVPB (0 mg Intravenous Stopped 07/11/20 1929)  docusate sodium (COLACE) capsule 100 mg (has no administration in time range)  polyethylene glycol (MIRALAX / GLYCOLAX) packet 17 g (has no administration in time range)  heparin injection 5,000 Units (has no administration in time range)  lactated ringers infusion (has no administration in time range)  acetaminophen (TYLENOL) tablet 650 mg (has no administration in time range)  ipratropium-albuterol (DUONEB) 0.5-2.5 (3) MG/3ML nebulizer solution 3 mL (has no administration in time range)  insulin aspart (novoLOG) injection 0-15 Units (has no administration in time range)  acetaminophen (TYLENOL) tablet 650 mg (650 mg Oral Given 07/11/20 1812)  ipratropium-albuterol (DUONEB) 0.5-2.5 (3) MG/3ML nebulizer solution 3 mL (3 mLs Nebulization Given 07/11/20 1729)  methylPREDNISolone sodium succinate (SOLU-MEDROL) 125 mg/2 mL injection 125 mg (125 mg Intravenous Given 07/11/20 1812)  lactated ringers bolus 1,000 mL (0 mLs Intravenous Stopped 07/11/20 2108)    And  lactated ringers bolus 500 mL (0 mLs Intravenous  Stopped 07/11/20 1950)    And  lactated ringers bolus 250 mL (0 mLs Intravenous Stopped 07/11/20 1950)  ipratropium-albuterol (DUONEB) 0.5-2.5 (3) MG/3ML nebulizer solution 3 mL (3 mLs Nebulization Given 07/11/20 1804)  insulin aspart (novoLOG) injection 8 Units (8 Units Subcutaneous Given 07/11/20 2116)     ED Course  I have reviewed the triage vital signs and the nursing notes.  Pertinent labs & imaging results that were available during my care of the patient were reviewed by me and considered in my medical decision making (see chart for details).    MDM Rules/Calculators/A&P                          65 y/o F presenting for eval of respiratory failure  Reviewed/interpreted labs CBC with mild leukocytosis, mild anemia CMP with elevated blood glucose, elevated bun/cr worse from prio, elevated anion gap, bicarb Trop elevated at >200, unclear if this is all due to demand and renal failure, ekg does not show ischemia and she is denying any chest pain BNP elevated 600 Coags with mildly elevated inr Covid neg UA pending VBG with low pH at 7.24, pCO2 is 62  EKG - Sinus tachycardia Low voltage, precordial leads no sig change from previous  Reviewed/interpreted imaging  CXR  Pt with hypotension, fever, tachycardia, tachypnea. Meets sepsis criteria. 30 cc/kg bolus ordered based on ideal body weight. Iv abx to cover CAP were ordered. Pt continues to byhypoxic and tachypneic on 15L NRB. She was placed on bipap and given duo nebs and steroids. She was also given insulin for her elevated BS. Will defer decision to heparinize to critical care team.   8:17 PM CONSULT with Dr. Carson Myrtle with critical care. Crit care will see the patient in the ED.   Critical care with admit the patient   Final Clinical Impression(s) / ED Diagnoses Final diagnoses:  Acute respiratory failure with hypoxia ([place])  Community acquired pneumonia, unspecified laterality  Hyperglycemia  Elevated troponin    Rx / DC Orders ED Discharge Orders    None       Bishop Dublin 07/11/20 2122    Charlesetta Shanks, MD 07/24/20 1746

## 2020-07-11 NOTE — ED Notes (Signed)
RT applying BIPAP at this time.

## 2020-07-11 NOTE — ED Notes (Signed)
Pt states that she feels "woozy".

## 2020-07-11 NOTE — Sepsis Progress Note (Signed)
eLink following Code Sepsis.

## 2020-07-11 NOTE — Sepsis Progress Note (Signed)
Notified provider of need to order repeat lactic acid and fluid bolus.  Orders entered by provider.

## 2020-07-11 NOTE — H&P (Signed)
NAME:  Annette Gibson. Whidden, MRN:  [id number], DOB:  May 31, 1956, LOS: 0 ADMISSION DATE:  07/11/2020, CONSULTATION DATE:  2/8 REFERRING MD:  Dr Colvin Caroli, CHIEF COMPLAINT:  Sob and generally fatigued  Brief History   65 yo female presents with 10 days diarrhea, 3 days of lethargy, decreased po intake and sob.   History of present illness   65 yo femal with pmh of copd, diastolic hf, ckd4, dm2, anxiety presents with sob and lethargy. Pt provides own history without great issue. Pt states that last Monday she went to [facility] and became sick shortly after with diarrhea. Since then she has not had good intake but reports she has been cont to take all her medications. She states that she failed to improve and for the past 3 days she has remained in bed and not felt as though she could get up. She endorses continued diarrhea, no n/v no recent sick contacts. She states she has only been able to take in ginger ale. Today she became worsening sob, she does state that she may have been before today but hadn't really moved. She suffers from copd and chronic cough that is non productive and this has not changed.   She did present to [facility] yesterday with BS in 700's and was discharged from ed but failed to improve.  Past Medical History   Past Medical History:  Diagnosis Date  . Anal warts    anal warts  . Anxiety   . CKD (chronic kidney disease)   . Congestive heart failure (CHF) ([place])   . COPD (chronic obstructive pulmonary disease) ([place])   . Diabetes mellitus without complication ([place])   . Hypertriglyceridemia   . Obesity    metabolic syndrome   . Osteopenia   . Vitamin D deficiency     Significant Hospital Events   2/8 admitted  Consults:    Procedures:    Significant Diagnostic Tests:    Micro Data:  2/8 strep urine ag 2/8 leg urine ag 2/8 blood:  2/8 urine:  2/8 GI panel: 2/8 cdiff:  Antimicrobials:  2/8 ceftriaxone 2/8 azithro  Interim history/subjective:  As above  Objective    Blood pressure (!) 115/97, pulse 86, temperature 98.4 F (36.9 C), temperature source Oral, resp. rate (!) 32, SpO2 93 %.    FiO2 (%):  [100 %] 100 %  No intake or output data in the 24 hours ending 07/11/20 2125 There were no vitals filed for this visit.  Examination: General: no acute distress, resting comfortably with NIV in place.  HEENT: NCAT, EOMI, PERRLA, Mucous membrane dry  Lungs: diminished bilaterally R>>L Cardiovascular: RRR, no m/g/r Abdomen: soft, NT,ND, BS+ Extremities: no c/c/e Skin: no rashes, warm and dry Neuro: moves all 4 extremities GU: deferred  Resolved Hospital Problem list     Assessment & Plan:  Acute on chronic hypoxic resp failure:  -NIV as tolerated -wean to [place] when able -prn nebs -on 3L [place] at home but has not had it recently Sepsis 2/2 Cap, suspect viral vs atypical vs gp:  -non productive -noted on imaging -titrate oxygen as needed -cont abx -check strep/legionella for completeness sake Copd:  -without acute exacerbation -no wheezing noted -no indication for systemic steroids at this time.   ARF on CKD4:  -IVF -suspect 2/2 hyperglycemia and diarrhea of late with poor po intake -cont gentle fluids -monitor indices -avoid nephrotoxic agents -follows with renal as outpt  Lactic acidosis:  -trend Pseudohyponatremia:  -corrects to 141 when accounting  for glucose  Hypotension:  -fluid responsive -echo pending HTN:  -hold home agents Elevated BNP and Trop:  -no ekg changes noted -echo pending -trend trop -. If changes to function or regional wall motion consult cards and consider heparin gtt - at this time no cp and with renal insuff and hypotension certainly could explain trop  dm2 with hyperglycemia:  -check a1c -states meds were changed a few weeks ago 2/2 renal function -she reports elevated bs since -high as 700's yesterday -cont subcut insulin -check beta hydroxybutyric acid level or ketones in urine if able to  produce  Sepsis 2/2 Diarrhea:  -since ate at [facility] last Monday -gi panel sent -volume resuscitate   Normocytic anemia:  -noted, will follow  Best practice:  Diet: npo Pain/Anxiety/Delirium protocol (if indicated): n/a VAP protocol (if indicated): n/a DVT prophylaxis: heparin GI prophylaxis: n/a Glucose control: insulin subcut Mobility: bedrest Code Status: full Family Communication: d/w pt Disposition: ICU  Labs   CBC: Recent Labs  Lab 07/11/20 1738 07/11/20 1751  WBC 11.9*  --   NEUTROABS 10.2*  --   HGB 10.8* 11.6*  HCT 35.4* 34.0*  MCV 100.0  --   PLT PLATELET CLUMPS NOTED ON SMEAR, UNABLE TO ESTIMATE  --     Basic Metabolic Panel: Recent Labs  Lab 07/11/20 1738 07/11/20 1751  NA 134* 134*  K 4.8 4.8  CL 93*  --   CO2 23  --   GLUCOSE 534*  --   BUN 79*  --   CREATININE 4.76*  --   CALCIUM 9.1  --    GFR: CrCl cannot be calculated (Unknown ideal weight.). Recent Labs  Lab 07/11/20 1738 07/11/20 1942  WBC 11.9*  --   LATICACIDVEN 2.7* 3.1*    Liver Function Tests: Recent Labs  Lab 07/11/20 1738  AST 36  ALT 23  ALKPHOS 61  BILITOT 1.6*  PROT 6.1*  ALBUMIN 2.1*   No results for input(s): LIPASE, AMYLASE in the last 168 hours. No results for input(s): AMMONIA in the last 168 hours.  ABG    Component Value Date/Time   HCO3 27.0 07/11/2020 1751   TCO2 29 07/11/2020 1751   ACIDBASEDEF 1.0 07/11/2020 1751   O2SAT 85.0 07/11/2020 1751     Coagulation Profile: Recent Labs  Lab 07/11/20 1738  INR 1.3*    Cardiac Enzymes: No results for input(s): CKTOTAL, CKMB, CKMBINDEX, TROPONINI in the last 168 hours.  HbA1C: HB A1C (BAYER DCA - WAIVED)  Date/Time Value Ref Range Status  01/20/2018 11:21 AM 6.1 <7.0 % Final    Comment:                                          Diabetic Adult            <7.0                                       Healthy Adult        4.3 - 5.7                                                            (  DCCT/NGSP) American Diabetes Association's Summary of Glycemic Recommendations for Adults with Diabetes: Hemoglobin A1c <7.0%. More stringent glycemic goals (A1c <6.0%) may further reduce complications at the cost of increased risk of hypoglycemia.   10/14/2017 10:19 AM 6.8 <7.0 % Final    Comment:                                          Diabetic Adult            <7.0                                       Healthy Adult        4.3 - 5.7                                                           (DCCT/NGSP) American Diabetes Association's Summary of Glycemic Recommendations for Adults with Diabetes: Hemoglobin A1c <7.0%. More stringent glycemic goals (A1c <6.0%) may further reduce complications at the cost of increased risk of hypoglycemia.     CBG: Recent Labs  Lab 07/11/20 1710  GLUCAP 469*    Review of Systems:   Diarrhea x10 days, sob, lethargy, decreased po intake x3 days  Denies fever/chills/n/v/cp/dizziness, no new cough or sputum production (chronic from copd still present)  Hyperglycemia despite decreased po intake to 700's.   Inability to leave bed for 3 days  Past Medical History  She,  has a past medical history of Anal warts, Anxiety, CKD (chronic kidney disease), Congestive heart failure (CHF) ([place]), COPD (chronic obstructive pulmonary disease) ([place]), Diabetes mellitus without complication ([place]), Hypertriglyceridemia, Obesity, Osteopenia, and Vitamin D deficiency.   Surgical History    Past Surgical History:  Procedure Laterality Date  . ABDOMINAL HYSTERECTOMY    . CATARACT EXTRACTION Bilateral   . COLONOSCOPY WITH PROPOFOL N/A 05/02/2020   Procedure: COLONOSCOPY WITH PROPOFOL;  Surgeon: Eloise Harman, DO;  Location: [facility];  Service: Endoscopy;  Laterality: N/A;  8:30am  . Excision of perineal condyloma acuminata  01/12/2020  . FRACTURE SURGERY     Right leg and ankle  . lt ganglion cyst removal Bilateral 09/2007  . POLYPECTOMY  05/02/2020    Procedure: POLYPECTOMY;  Surgeon: Eloise Harman, DO;  Location: [facility];  Service: Endoscopy;;     Social History   reports that she quit smoking about 13 months ago. Her smoking use included cigarettes. She has a 25.00 pack-year smoking history. She has never used smokeless tobacco. She reports that she does not drink alcohol and does not use drugs.   Family History   Her family history includes Diabetes in her mother; Pneumonia in her mother. There is no history of Colon cancer.   Allergies Allergies  Allergen Reactions  . Celebrex [Celecoxib] Other (See Comments)    Caused seizure  . Sulfa Antibiotics Shortness Of Breath  . Voltaren [Diclofenac Sodium] Other (See Comments)    Caused seizure      Home Medications  Prior to Admission medications   Medication Sig Start Date End Date Taking? Authorizing Provider  albuterol (PROVENTIL HFA;VENTOLIN HFA) 108 (  90 Base) MCG/ACT inhaler Inhale 1 puff into the lungs every 6 (six) hours as needed for wheezing or shortness of breath. Patient taking differently: Inhale 2 puffs into the lungs every 6 (six) hours as needed for wheezing or shortness of breath.  05/28/17   Roxan Hockey, MD  albuterol (PROVENTIL) (2.5 MG/3ML) 0.083% nebulizer solution Take 3 mLs (2.5 mg total) by nebulization every 6 (six) hours as needed for wheezing or shortness of breath. 05/28/17   Roxan Hockey, MD  atorvastatin (LIPITOR) 40 MG tablet Take 1 tablet (40 mg total) by mouth daily at 6 PM. Patient taking differently: Take 40 mg by mouth at bedtime. Take 1 tablet (40 mg total) by mouth daily at 6 PM. 01/20/18   Hassell Done, Mary-Margaret, FNP  buPROPion [facility] SR) 150 MG 12 hr tablet Take 150 mg by mouth daily.    [provider]  carvedilol (COREG) 3.125 MG tablet Take 1 tablet (3.125 mg total) by mouth 2 (two) times daily with a meal. 01/20/18   Hassell Done, Mary-Margaret, FNP  fenofibrate (TRICOR) 145 MG tablet Take 1 tablet (145 mg total) by  mouth daily. Patient taking differently: Take 145 mg by mouth at bedtime.  01/20/18   Hassell Done, Mary-Margaret, FNP  furosemide (LASIX) 40 MG tablet 1 tablet 40 mg every morning and 1/2 tablet (20 mg) every evening Patient taking differently: Take 20-40 mg by mouth See admin instructions. Take  40 mg every morning and 20 mg every evening 04/06/18   Hassell Done, Mary-Margaret, FNP  glipiZIDE (GLUCOTROL) 5 MG tablet Take 5 mg by mouth daily. 04/12/20   [provider]  metFORMIN (GLUCOPHAGE) 1000 MG tablet TAKE 1 TABLET BY MOUTH TWICE DAILY WITH A MEAL Patient taking differently: Take 500 mg by mouth 2 (two) times daily with a meal.  01/20/18   Hassell Done, Mary-Margaret, FNP  Multiple Vitamins-Minerals (HAIR SKIN AND NAILS FORMULA) TABS Take 1 tablet by mouth daily.    [provider]     Critical care time: The patient is critically ill with multiple organ systems failure and requires high complexity decision making for assessment and support, frequent evaluation and titration of therapies, application of advanced monitoring technologies and extensive interpretation of multiple databases.  Critical care time 39 mins. This represents my time independent of the NP's/PA's/med students/residents time taking care of the pt. This is excluding procedures.     [name] DO [place] [facility] 07/11/2020, 9:25 PM See Amion for pager If no response to pager, please call [phone] until 1900 After 1900 please call [facility] [phone]

## 2020-07-11 NOTE — ED Notes (Signed)
Pt assisted with bedpan. No stool or urine specimen at this time.

## 2020-07-11 NOTE — ED Notes (Signed)
RT assisting this RN to remove Bipap & pt to take Tylenol PO.

## 2020-07-12 ENCOUNTER — Inpatient Hospital Stay (HOSPITAL_COMMUNITY): Payer: Medicare HMO

## 2020-07-12 DIAGNOSIS — R0603 Acute respiratory distress: Secondary | ICD-10-CM | POA: Diagnosis not present

## 2020-07-12 DIAGNOSIS — J9601 Acute respiratory failure with hypoxia: Secondary | ICD-10-CM | POA: Diagnosis not present

## 2020-07-12 DIAGNOSIS — E1110 Type 2 diabetes mellitus with ketoacidosis without coma: Secondary | ICD-10-CM | POA: Diagnosis not present

## 2020-07-12 LAB — URINALYSIS, ROUTINE W REFLEX MICROSCOPIC
Bilirubin Urine: NEGATIVE
Glucose, UA: >=500 mg/dL — AB
Ketones, ur: NEGATIVE mg/dL
Leukocytes,Ua: NEGATIVE
Nitrite: NEGATIVE
Protein, ur: NEGATIVE mg/dL
Specific Gravity, Urine: 1.011 (ref 1.005–1.030)
pH: 5 (ref 5.0–8.0)

## 2020-07-12 LAB — BLOOD CULTURE ID PANEL (REFLEXED) - BCID2

## 2020-07-12 LAB — GLUCOSE, CAPILLARY
Glucose-Capillary: 446 mg/dL — ABNORMAL HIGH (ref 70–99)
Glucose-Capillary: 467 mg/dL — ABNORMAL HIGH (ref 70–99)
Glucose-Capillary: 496 mg/dL — ABNORMAL HIGH (ref 70–99)
Glucose-Capillary: 455 mg/dL — ABNORMAL HIGH (ref 70–99)
Glucose-Capillary: 462 mg/dL — ABNORMAL HIGH (ref 70–99)
Glucose-Capillary: 407 mg/dL — ABNORMAL HIGH (ref 70–99)
Glucose-Capillary: 408 mg/dL — ABNORMAL HIGH (ref 70–99)
Glucose-Capillary: 417 mg/dL — ABNORMAL HIGH (ref 70–99)
Glucose-Capillary: 443 mg/dL — ABNORMAL HIGH (ref 70–99)
Glucose-Capillary: 401 mg/dL — ABNORMAL HIGH (ref 70–99)
Glucose-Capillary: 400 mg/dL — ABNORMAL HIGH (ref 70–99)
Glucose-Capillary: 341 mg/dL — ABNORMAL HIGH (ref 70–99)
Glucose-Capillary: 285 mg/dL — ABNORMAL HIGH (ref 70–99)
Glucose-Capillary: 258 mg/dL — ABNORMAL HIGH (ref 70–99)
Glucose-Capillary: 170 mg/dL — ABNORMAL HIGH (ref 70–99)
Glucose-Capillary: 135 mg/dL — ABNORMAL HIGH (ref 70–99)
Glucose-Capillary: 75 mg/dL (ref 70–99)
Glucose-Capillary: 80 mg/dL (ref 70–99)
Glucose-Capillary: 73 mg/dL (ref 70–99)
Glucose-Capillary: 70 mg/dL (ref 70–99)
Glucose-Capillary: 59 mg/dL — ABNORMAL LOW (ref 70–99)
Glucose-Capillary: 138 mg/dL — ABNORMAL HIGH (ref 70–99)
Glucose-Capillary: 90 mg/dL (ref 70–99)
Glucose-Capillary: 72 mg/dL (ref 70–99)
Glucose-Capillary: 70 mg/dL (ref 70–99)
Glucose-Capillary: 76 mg/dL (ref 70–99)

## 2020-07-12 LAB — BASIC METABOLIC PANEL
Anion gap: 15 (ref 5–15)
Anion gap: 14 (ref 5–15)
Anion gap: 13 (ref 5–15)
Anion gap: 12 (ref 5–15)
BUN: 80 mg/dL — ABNORMAL HIGH (ref 8–23)
BUN: 78 mg/dL — ABNORMAL HIGH (ref 8–23)
BUN: 80 mg/dL — ABNORMAL HIGH (ref 8–23)
BUN: 75 mg/dL — ABNORMAL HIGH (ref 8–23)
CO2: 23 mmol/L (ref 22–32)
CO2: 24 mmol/L (ref 22–32)
CO2: 22 mmol/L (ref 22–32)
CO2: 24 mmol/L (ref 22–32)
Calcium: 8.6 mg/dL — ABNORMAL LOW (ref 8.9–10.3)
Calcium: 9.2 mg/dL (ref 8.9–10.3)
Calcium: 9.2 mg/dL (ref 8.9–10.3)
Calcium: 9.3 mg/dL (ref 8.9–10.3)
Chloride: 98 mmol/L (ref 98–111)
Chloride: 101 mmol/L (ref 98–111)
Chloride: 105 mmol/L (ref 98–111)
Chloride: 103 mmol/L (ref 98–111)
Creatinine, Ser: 4.19 mg/dL — ABNORMAL HIGH (ref 0.44–1.00)
Creatinine, Ser: 3.98 mg/dL — ABNORMAL HIGH (ref 0.44–1.00)
Creatinine, Ser: 3.77 mg/dL — ABNORMAL HIGH (ref 0.44–1.00)
Creatinine, Ser: 3.71 mg/dL — ABNORMAL HIGH (ref 0.44–1.00)
GFR, Estimated: 11 mL/min — ABNORMAL LOW (ref >60)
GFR, Estimated: 12 mL/min — ABNORMAL LOW (ref >60)
GFR, Estimated: 13 mL/min — ABNORMAL LOW (ref >60)
GFR, Estimated: 13 mL/min — ABNORMAL LOW (ref >60)
Glucose, Bld: 455 mg/dL — ABNORMAL HIGH (ref 70–99)
Glucose, Bld: 236 mg/dL — ABNORMAL HIGH (ref 70–99)
Glucose, Bld: 66 mg/dL — ABNORMAL LOW (ref 70–99)
Glucose, Bld: 111 mg/dL — ABNORMAL HIGH (ref 70–99)
Potassium: 5.5 mmol/L — ABNORMAL HIGH (ref 3.5–5.1)
Potassium: 4.2 mmol/L (ref 3.5–5.1)
Potassium: 4.8 mmol/L (ref 3.5–5.1)
Potassium: 3.7 mmol/L (ref 3.5–5.1)
Sodium: 136 mmol/L (ref 135–145)
Sodium: 139 mmol/L (ref 135–145)
Sodium: 140 mmol/L (ref 135–145)
Sodium: 139 mmol/L (ref 135–145)

## 2020-07-12 LAB — PHOSPHORUS: Phosphorus: 5 mg/dL — ABNORMAL HIGH (ref 2.5–4.6)

## 2020-07-12 LAB — CBC
HCT: 33.8 % — ABNORMAL LOW (ref 36.0–46.0)
Hemoglobin: 10.4 g/dL — ABNORMAL LOW (ref 12.0–15.0)
MCH: 30.8 pg (ref 26.0–34.0)
MCHC: 30.8 g/dL (ref 30.0–36.0)
MCV: 100 fL (ref 80.0–100.0)
Platelets: 173 10*3/uL (ref 150–400)
RBC: 3.38 MIL/uL — ABNORMAL LOW (ref 3.87–5.11)
RDW: 12.4 % (ref 11.5–15.5)
WBC: 9.4 10*3/uL (ref 4.0–10.5)
nRBC: 0.9 % — ABNORMAL HIGH (ref 0.0–0.2)

## 2020-07-12 LAB — ECHOCARDIOGRAM COMPLETE
Area-P 1/2: 4.04 cm2
Height: 58 in
S' Lateral: 3 cm
Weight: 3453.29 oz

## 2020-07-12 LAB — LACTIC ACID, PLASMA
Lactic Acid, Venous: 2.8 mmol/L (ref 0.5–1.9)
Lactic Acid, Venous: 2.3 mmol/L (ref 0.5–1.9)

## 2020-07-12 LAB — STREP PNEUMONIAE URINARY ANTIGEN: Strep Pneumo Urinary Antigen: POSITIVE — AB

## 2020-07-12 LAB — MRSA PCR SCREENING: MRSA by PCR: NEGATIVE

## 2020-07-12 LAB — MAGNESIUM: Magnesium: 2 mg/dL (ref 1.7–2.4)

## 2020-07-12 LAB — BETA-HYDROXYBUTYRIC ACID
Beta-Hydroxybutyric Acid: 0.34 mmol/L — ABNORMAL HIGH (ref 0.05–0.27)
Beta-Hydroxybutyric Acid: 0.06 mmol/L (ref 0.05–0.27)

## 2020-07-12 MED ORDER — DEXTROSE 50 % IV SOLN
INTRAVENOUS | Status: AC
  Administered 2020-07-12: 50 mL
  Filled 2020-07-12: qty 50

## 2020-07-12 MED ORDER — LACTATED RINGERS IV BOLUS
20.0000 mL/kg | Freq: Once | INTRAVENOUS | Status: AC
  Administered 2020-07-12: 2000 mL via INTRAVENOUS

## 2020-07-12 MED ORDER — LACTATED RINGERS IV SOLN: INTRAVENOUS | Status: DC

## 2020-07-12 MED ORDER — INFLUENZA VAC SPLIT QUAD 0.5 ML IM SUSY: 0.5000 mL | PREFILLED_SYRINGE | INTRAMUSCULAR | Status: DC | PRN

## 2020-07-12 MED ORDER — POTASSIUM CHLORIDE 10 MEQ/100ML IV SOLN
10.0000 meq | INTRAVENOUS | Status: AC
  Administered 2020-07-12 (×2): 10 meq via INTRAVENOUS
  Filled 2020-07-12 (×2): qty 100

## 2020-07-12 MED ORDER — DEXTROSE 50 % IV SOLN: 0.0000 mL | INTRAVENOUS | Status: DC | PRN

## 2020-07-12 MED ORDER — INSULIN REGULAR(HUMAN) IN NACL 100-0.9 UT/100ML-% IV SOLN
INTRAVENOUS | Status: DC
  Administered 2020-07-12: 28 [IU]/h via INTRAVENOUS
  Administered 2020-07-12: 14 [IU]/h via INTRAVENOUS
  Administered 2020-07-13: 3 [IU]/h via INTRAVENOUS
  Filled 2020-07-12 (×3): qty 100

## 2020-07-12 MED ORDER — HEPARIN SODIUM (PORCINE) 5000 UNIT/ML IJ SOLN
5000.0000 [IU] | Freq: Three times a day (TID) | INTRAMUSCULAR | Status: DC
  Administered 2020-07-12 – 2020-07-24 (×36): 5000 [IU] via SUBCUTANEOUS
  Filled 2020-07-12 (×37): qty 1

## 2020-07-12 MED ORDER — CHLORHEXIDINE GLUCONATE CLOTH 2 % EX PADS
6.0000 | MEDICATED_PAD | Freq: Every day | CUTANEOUS | Status: DC
  Administered 2020-07-12 – 2020-07-31 (×19): 6 via TOPICAL

## 2020-07-12 MED ORDER — CHLORHEXIDINE GLUCONATE CLOTH 2 % EX PADS: 6.0000 | MEDICATED_PAD | Freq: Every day | CUTANEOUS | Status: DC

## 2020-07-12 MED ORDER — DEXTROSE IN LACTATED RINGERS 5 % IV SOLN: INTRAVENOUS | Status: DC

## 2020-07-12 NOTE — Progress Notes (Signed)
[facility] Progress Note Patient Name: Darianna Amy. Gaultney DOB: [birth year] MRN: [id number]   Date of Service  07/12/2020  HPI/Events of Note  DKA - B-Hydroxybutarate = 1.84 and Blood glucose = 454 --> 446. Lactic Acid = 3.1 --> 2.8.  eICU Interventions  Plan: 1. D/C Novolog SSI. 2. Insulin IV infusion per EndoTool protocol for DKA.      Intervention Category Major Interventions: Hyperglycemia - active titration of insulin therapy  Latiya Navia Cornelia Copa 07/12/2020, 2:39 AM

## 2020-07-12 NOTE — Progress Notes (Signed)
This RN has called E-link for directions and orders regarding the patient's insulin drip. Awaiting orders from [place]. Insulin drip remains on at 3.6 units/hr.

## 2020-07-12 NOTE — Progress Notes (Signed)
Patient arrived via stretcher to 30m4 on bipap. Patient is alert and oriented X4. Two PIV's in B/L AC's patent and C/D/I. No belongings with patient. No skin issues noted. [place] Call bell given to patient and she was instructed on how to call for help when she needs it. Continuing to monitor.

## 2020-07-12 NOTE — Progress Notes (Signed)
  Echocardiogram 2D Echocardiogram has been performed.  Fidel Levy 07/12/2020, 9:50 AM

## 2020-07-12 NOTE — ED Notes (Signed)
Paged Hospitalist to notify of CBG of [phone].

## 2020-07-12 NOTE — Progress Notes (Signed)
Patient transported to 5K58 without complications.

## 2020-07-12 NOTE — ED Notes (Signed)
Paged attending for RN

## 2020-07-12 NOTE — Progress Notes (Signed)
NAME:  Annette Gibson. Shein, MRN:  [id number], DOB:  1956-03-11, LOS: 1 ADMISSION DATE:  07/11/2020, CONSULTATION DATE:  2/8 REFERRING MD:  Dr Colvin Caroli, CHIEF COMPLAINT:  Sob and generally fatigued  Brief History   65 yo female presents with 10 days diarrhea, 3 days of lethargy, decreased po intake and sob.   History of present illness   65 yo femal with pmh of copd, diastolic hf, ckd4, dm2, anxiety presents with sob and lethargy. Pt provides own history without great issue. Pt states that last Monday she went to [facility] and became sick shortly after with diarrhea. Since then she has not had good intake but reports she has been cont to take all her medications. She states that she failed to improve and for the past 3 days she has remained in bed and not felt as though she could get up. She endorses continued diarrhea, no n/v no recent sick contacts. She states she has only been able to take in ginger ale. Today she became worsening sob, she does state that she may have been before today but hadn't really moved. She suffers from copd and chronic cough that is non productive and this has not changed.   She did present to [facility] yesterday with BS in 700's and was discharged from ed but failed to improve.  Past Medical History   Past Medical History:  Diagnosis Date  . Anal warts    anal warts  . Anxiety   . CKD (chronic kidney disease)   . Congestive heart failure (CHF) ([place])   . COPD (chronic obstructive pulmonary disease) ([place])   . Diabetes mellitus without complication ([place])   . Hypertriglyceridemia   . Obesity    metabolic syndrome   . Osteopenia   . Vitamin D deficiency     Significant Hospital Events   2/8 admitted  Consults:    Procedures:    Significant Diagnostic Tests:    Micro Data:  2/8 strep urine ag >>  2/8 leg urine ag 2/8 blood:  2/8 urine:  2/8 GI panel: 2/8 cdiff:  Antimicrobials:  2/8 ceftriaxone >>  2/8 azithro >>   Interim history/subjective:   Patient  wore BiPAP overnight, remains on BiPAP Insulin infusion, LR running Anion gap 15 this morning Lactic acid improved to 2.3 Procalcitonin elevated 37.25  Objective   Blood pressure 125/66, pulse 95, temperature 97.8 F (36.6 C), temperature source Axillary, resp. rate 16, height _0  (1.473 m), weight 97.9 kg, SpO2 99 %.    FiO2 (%):  [60 %-100 %] 100 %   Intake/Output Summary (Last 24 hours) at 07/12/2020 1146 Last data filed at 07/12/2020 1829 Gross per 24 hour  Intake 3403.6 ml  Output 290 ml  Net 3113.6 ml   Filed Weights   07/12/20 0234  Weight: 97.9 kg    Examination: General: No distress, BiPAP in place HEENT: Oropharynx clear, mucous membranes dry Lungs: Coarse breath sounds on the right, clear on the left Cardiovascular: Regular, no murmur Abdomen: Soft, nondistended, positive bowel sounds Extremities: No cyanosis, no edema Skin: No rash Neuro: Awake, alert, interacting appropriately  Resolved Hospital Problem list     Assessment & Plan:  Acute on chronic hypoxic resp failure:  Tolerated BiPAP, plan to try to give her a break today 2/9, wean to her usual 3 L/min Albuterol nebs as needed  Sepsis 2/2 Cap, suspect pneumococcus given positive urinary antigen Respiratory culture pending Continue azithromycin, ceftriaxone Follow chest x-ray  Copd:  -without acute  exacerbation Defer corticosteroids Albuterol as needed Question whether she may benefit from scheduled bronchodilator therapy going forward  dm2 with hyperglycemia and DKA:  Continue aggressive IV fluids, LR. Plan to transition to dextrose fluids until anion gap closes Continue insulin infusion pending improvement in her acidosis  Shock, consistent with hypovolemic shock due to her diarrhea. Consider component septic shock given evidence for pneumonia:  Continue aggressive IV fluid resuscitation. Hypotension improved with volume History of HTN:  Holding home antihypertensive regimen Elevated BNP  and Trop:  No EKG changes, follow Echocardiogram ordered and pending Follow troponin trend  ARF on CKD4:  -Improving some with volume resuscitation 2/9 Plan to continue aggressive IV fluids Restore adequate perfusion, avoid nephrotoxins Follow BMP and urine output closely Lactic acidosis:  Cleared  Diarrhea: Question gastroenteritis due to food, question related to her overall sepsis, pneumonia Volume resuscitation GI panel ordered but still pending, no sample  Normocytic anemia:  Following CBC  Best practice:  Diet: npo Pain/Anxiety/Delirium protocol (if indicated): n/a VAP protocol (if indicated): n/a DVT prophylaxis: heparin GI prophylaxis: n/a Glucose control: insulin subcut Mobility: bedrest Code Status: full Family Communication: d/w pt Disposition: ICU  Labs   CBC: Recent Labs  Lab 07/11/20 1738 07/11/20 1751 07/12/20 0625  WBC 11.9*  --  9.4  NEUTROABS 10.2*  --   --   HGB 10.8* 11.6* 10.4*  HCT 35.4* 34.0* 33.8*  MCV 100.0  --  100.0  PLT PLATELET CLUMPS NOTED ON SMEAR, UNABLE TO ESTIMATE  --  122    Basic Metabolic Panel: Recent Labs  Lab 07/11/20 1738 07/11/20 1751 07/12/20 0625  NA 134* 134* 136  K 4.8 4.8 5.5*  CL 93*  --  98  CO2 23  --  23  GLUCOSE 534*  --  455*  BUN 79*  --  80*  CREATININE 4.76*  --  4.19*  CALCIUM 9.1  --  8.6*  MG  --   --  2.0  PHOS  --   --  5.0*   GFR: Estimated Creatinine Clearance: 13.6 mL/min (A) (by C-G formula based on SCr of 4.19 mg/dL (H)). Recent Labs  Lab 07/11/20 1738 07/11/20 1942 07/11/20 2114 07/12/20 0121 07/12/20 0551 07/12/20 0625  PROCALCITON  --   --  37.25  --   --   --   WBC 11.9*  --   --   --   --  9.4  LATICACIDVEN 2.7* 3.1*  --  2.8* 2.3*  --     Liver Function Tests: Recent Labs  Lab 07/11/20 1738  AST 36  ALT 23  ALKPHOS 61  BILITOT 1.6*  PROT 6.1*  ALBUMIN 2.1*   No results for input(s): LIPASE, AMYLASE in the last 168 hours. No results for input(s): AMMONIA in  the last 168 hours.  ABG    Component Value Date/Time   HCO3 27.0 07/11/2020 1751   TCO2 29 07/11/2020 1751   ACIDBASEDEF 1.0 07/11/2020 1751   O2SAT 85.0 07/11/2020 1751     Coagulation Profile: Recent Labs  Lab 07/11/20 1738  INR 1.3*    Cardiac Enzymes: No results for input(s): CKTOTAL, CKMB, CKMBINDEX, TROPONINI in the last 168 hours.  HbA1C: HB A1C (BAYER DCA - WAIVED)  Date/Time Value Ref Range Status  01/20/2018 11:21 AM 6.1 <7.0 % Final    Comment:  Diabetic Adult            <7.0                                       Healthy Adult        4.3 - 5.7                                                           (DCCT/NGSP) American Diabetes Association's Summary of Glycemic Recommendations for Adults with Diabetes: Hemoglobin A1c <7.0%. More stringent glycemic goals (A1c <6.0%) may further reduce complications at the cost of increased risk of hypoglycemia.   10/14/2017 10:19 AM 6.8 <7.0 % Final    Comment:                                          Diabetic Adult            <7.0                                       Healthy Adult        4.3 - 5.7                                                           (DCCT/NGSP) American Diabetes Association's Summary of Glycemic Recommendations for Adults with Diabetes: Hemoglobin A1c <7.0%. More stringent glycemic goals (A1c <6.0%) may further reduce complications at the cost of increased risk of hypoglycemia.    Hgb A1c MFr Bld  Date/Time Value Ref Range Status  07/11/2020 09:19 PM 11.8 (H) 4.8 - 5.6 % Final    Comment:    (NOTE) Pre diabetes:          5.7%-6.4%  Diabetes:              >6.4%  Glycemic control for   <7.0% adults with diabetes     CBG: Recent Labs  Lab 07/12/20 0651 07/12/20 0735 07/12/20 0846 07/12/20 0950 07/12/20 1051  GLUCAP 401* 400* 341* 285* 258*     Critical care time: The patient is critically ill with multiple organ systems failure and requires  high complexity decision making for assessment and support, frequent evaluation and titration of therapies, application of advanced monitoring technologies and extensive interpretation of multiple databases.  Critical care time 32 mins. This represents my time independent of the NP's/PA's/med students/residents time taking care of the pt. This is excluding procedures.     Baltazar Apo, MD, PhD 07/12/2020, 11:49 AM [facility] [phone] or if no answer before 7:00PM call [phone] For any issues after 7:00PM please call eLink [phone]

## 2020-07-12 NOTE — Progress Notes (Signed)
PHARMACY - PHYSICIAN COMMUNICATION CRITICAL VALUE ALERT - BLOOD CULTURE IDENTIFICATION (BCID)  Annette Gibson is an 65 y.o. female who presented to [facility] on 07/11/2020  Assessment:  Strep PNA in Blood  Name of physician (or Provider) Contacted: Dr Lamonte Sakai  Current antibiotics: Ceftriaxone Azithro  Results for orders placed or performed during the hospital encounter of 07/11/20  Blood Culture ID Panel (Reflexed) (Collected: 07/11/2020  5:38 PM)  Result Value Ref Range   Enterococcus faecalis NOT DETECTED NOT DETECTED   Enterococcus Faecium NOT DETECTED NOT DETECTED   Listeria monocytogenes NOT DETECTED NOT DETECTED   Staphylococcus species NOT DETECTED NOT DETECTED   Staphylococcus aureus (BCID) NOT DETECTED NOT DETECTED   Staphylococcus epidermidis NOT DETECTED NOT DETECTED   Staphylococcus lugdunensis NOT DETECTED NOT DETECTED   Streptococcus species DETECTED (A) NOT DETECTED   Streptococcus agalactiae NOT DETECTED NOT DETECTED   Streptococcus pneumoniae DETECTED (A) NOT DETECTED   Streptococcus pyogenes NOT DETECTED NOT DETECTED   A.calcoaceticus-baumannii NOT DETECTED NOT DETECTED   Bacteroides fragilis NOT DETECTED NOT DETECTED   Enterobacterales NOT DETECTED NOT DETECTED   Enterobacter cloacae complex NOT DETECTED NOT DETECTED   Escherichia coli NOT DETECTED NOT DETECTED   Klebsiella aerogenes NOT DETECTED NOT DETECTED   Klebsiella oxytoca NOT DETECTED NOT DETECTED   Klebsiella pneumoniae NOT DETECTED NOT DETECTED   Proteus species NOT DETECTED NOT DETECTED   Salmonella species NOT DETECTED NOT DETECTED   Serratia marcescens NOT DETECTED NOT DETECTED   Haemophilus influenzae NOT DETECTED NOT DETECTED   Neisseria meningitidis NOT DETECTED NOT DETECTED   Pseudomonas aeruginosa NOT DETECTED NOT DETECTED   Stenotrophomonas maltophilia NOT DETECTED NOT DETECTED   Candida albicans NOT DETECTED NOT DETECTED   Candida auris NOT DETECTED NOT DETECTED   Candida glabrata NOT  DETECTED NOT DETECTED   Candida krusei NOT DETECTED NOT DETECTED   Candida parapsilosis NOT DETECTED NOT DETECTED   Candida tropicalis NOT DETECTED NOT DETECTED   Cryptococcus neoformans/gattii NOT DETECTED NOT DETECTED   Changes to prescribed antibiotics recommended:  DC Raoul Pitch, PharmD, BCPS, BCCCP Clinical Pharmacist [phone]  Please check AMION for all [place] numbers  07/12/2020 1:08 PM

## 2020-07-12 NOTE — Progress Notes (Signed)
Patient bladder scanned and 577 noted in bladder. Patient says she will void while hooked up to [place]. Continuing to monitor.

## 2020-07-13 ENCOUNTER — Inpatient Hospital Stay (HOSPITAL_COMMUNITY): Payer: Medicare HMO

## 2020-07-13 DIAGNOSIS — N184 Chronic kidney disease, stage 4 (severe): Secondary | ICD-10-CM | POA: Diagnosis not present

## 2020-07-13 DIAGNOSIS — E1110 Type 2 diabetes mellitus with ketoacidosis without coma: Secondary | ICD-10-CM

## 2020-07-13 DIAGNOSIS — J9601 Acute respiratory failure with hypoxia: Secondary | ICD-10-CM | POA: Diagnosis not present

## 2020-07-13 DIAGNOSIS — N189 Chronic kidney disease, unspecified: Secondary | ICD-10-CM

## 2020-07-13 DIAGNOSIS — N179 Acute kidney failure, unspecified: Secondary | ICD-10-CM

## 2020-07-13 DIAGNOSIS — J13 Pneumonia due to Streptococcus pneumoniae: Secondary | ICD-10-CM

## 2020-07-13 LAB — BASIC METABOLIC PANEL
Anion gap: 13 (ref 5–15)
Anion gap: 12 (ref 5–15)
Anion gap: 12 (ref 5–15)
BUN: 79 mg/dL — ABNORMAL HIGH (ref 8–23)
BUN: 78 mg/dL — ABNORMAL HIGH (ref 8–23)
BUN: 75 mg/dL — ABNORMAL HIGH (ref 8–23)
CO2: 25 mmol/L (ref 22–32)
CO2: 25 mmol/L (ref 22–32)
CO2: 27 mmol/L (ref 22–32)
Calcium: 9.4 mg/dL (ref 8.9–10.3)
Calcium: 9.5 mg/dL (ref 8.9–10.3)
Calcium: 9.3 mg/dL (ref 8.9–10.3)
Chloride: 102 mmol/L (ref 98–111)
Chloride: 102 mmol/L (ref 98–111)
Chloride: 103 mmol/L (ref 98–111)
Creatinine, Ser: 3.54 mg/dL — ABNORMAL HIGH (ref 0.44–1.00)
Creatinine, Ser: 3.34 mg/dL — ABNORMAL HIGH (ref 0.44–1.00)
Creatinine, Ser: 3.09 mg/dL — ABNORMAL HIGH (ref 0.44–1.00)
GFR, Estimated: 14 mL/min — ABNORMAL LOW (ref >60)
GFR, Estimated: 15 mL/min — ABNORMAL LOW (ref >60)
GFR, Estimated: 16 mL/min — ABNORMAL LOW (ref >60)
Glucose, Bld: 205 mg/dL — ABNORMAL HIGH (ref 70–99)
Glucose, Bld: 172 mg/dL — ABNORMAL HIGH (ref 70–99)
Glucose, Bld: 150 mg/dL — ABNORMAL HIGH (ref 70–99)
Potassium: 3.9 mmol/L (ref 3.5–5.1)
Potassium: 3.8 mmol/L (ref 3.5–5.1)
Potassium: 3.8 mmol/L (ref 3.5–5.1)
Sodium: 140 mmol/L (ref 135–145)
Sodium: 139 mmol/L (ref 135–145)
Sodium: 142 mmol/L (ref 135–145)

## 2020-07-13 LAB — URINE CULTURE

## 2020-07-13 LAB — GLUCOSE, CAPILLARY
Glucose-Capillary: 113 mg/dL — ABNORMAL HIGH (ref 70–99)
Glucose-Capillary: 126 mg/dL — ABNORMAL HIGH (ref 70–99)
Glucose-Capillary: 133 mg/dL — ABNORMAL HIGH (ref 70–99)
Glucose-Capillary: 136 mg/dL — ABNORMAL HIGH (ref 70–99)
Glucose-Capillary: 142 mg/dL — ABNORMAL HIGH (ref 70–99)
Glucose-Capillary: 148 mg/dL — ABNORMAL HIGH (ref 70–99)
Glucose-Capillary: 151 mg/dL — ABNORMAL HIGH (ref 70–99)
Glucose-Capillary: 152 mg/dL — ABNORMAL HIGH (ref 70–99)
Glucose-Capillary: 158 mg/dL — ABNORMAL HIGH (ref 70–99)
Glucose-Capillary: 166 mg/dL — ABNORMAL HIGH (ref 70–99)
Glucose-Capillary: 171 mg/dL — ABNORMAL HIGH (ref 70–99)
Glucose-Capillary: 182 mg/dL — ABNORMAL HIGH (ref 70–99)
Glucose-Capillary: 201 mg/dL — ABNORMAL HIGH (ref 70–99)
Glucose-Capillary: 71 mg/dL (ref 70–99)
Glucose-Capillary: 79 mg/dL (ref 70–99)
Glucose-Capillary: 83 mg/dL (ref 70–99)
Glucose-Capillary: 95 mg/dL (ref 70–99)

## 2020-07-13 LAB — CBC
HCT: 34.1 % — ABNORMAL LOW (ref 36.0–46.0)
Hemoglobin: 10.4 g/dL — ABNORMAL LOW (ref 12.0–15.0)
MCH: 30.3 pg (ref 26.0–34.0)
MCHC: 30.5 g/dL (ref 30.0–36.0)
MCV: 99.4 fL (ref 80.0–100.0)
Platelets: 208 10*3/uL (ref 150–400)
RBC: 3.43 MIL/uL — ABNORMAL LOW (ref 3.87–5.11)
RDW: 12.5 % (ref 11.5–15.5)
WBC: 13.9 10*3/uL — ABNORMAL HIGH (ref 4.0–10.5)
nRBC: 1 % — ABNORMAL HIGH (ref 0.0–0.2)

## 2020-07-13 LAB — ALBUMIN: Albumin: 1.5 g/dL — ABNORMAL LOW (ref 3.5–5.0)

## 2020-07-13 LAB — LEGIONELLA PNEUMOPHILA SEROGP 1 UR AG: L. pneumophila Serogp 1 Ur Ag: NEGATIVE

## 2020-07-13 MED ORDER — IPRATROPIUM-ALBUTEROL 0.5-2.5 (3) MG/3ML IN SOLN
3.0000 mL | Freq: Four times a day (QID) | RESPIRATORY_TRACT | Status: DC
  Administered 2020-07-13 – 2020-07-14 (×5): 3 mL via RESPIRATORY_TRACT
  Filled 2020-07-13 (×4): qty 3

## 2020-07-13 MED ORDER — ALBUTEROL SULFATE (2.5 MG/3ML) 0.083% IN NEBU: 2.5000 mg | INHALATION_SOLUTION | RESPIRATORY_TRACT | Status: DC | PRN

## 2020-07-13 MED ORDER — INSULIN ASPART 100 UNIT/ML ~~LOC~~ SOLN
1.0000 [IU] | SUBCUTANEOUS | Status: DC
  Administered 2020-07-14 (×2): 2 [IU] via SUBCUTANEOUS
  Administered 2020-07-14 – 2020-07-15 (×4): 1 [IU] via SUBCUTANEOUS
  Administered 2020-07-15: 3 [IU] via SUBCUTANEOUS
  Administered 2020-07-15 – 2020-07-16 (×4): 2 [IU] via SUBCUTANEOUS
  Administered 2020-07-16: 3 [IU] via SUBCUTANEOUS
  Administered 2020-07-16 – 2020-07-17 (×3): 2 [IU] via SUBCUTANEOUS

## 2020-07-13 MED ORDER — DEXTROSE-NACL 5-0.45 % IV SOLN: INTRAVENOUS | Status: DC

## 2020-07-13 MED ORDER — MORPHINE SULFATE (PF) 2 MG/ML IV SOLN: 0.5000 mg | INTRAVENOUS | Status: DC | PRN

## 2020-07-13 MED ORDER — INSULIN DETEMIR 100 UNIT/ML ~~LOC~~ SOLN
8.0000 [IU] | Freq: Two times a day (BID) | SUBCUTANEOUS | Status: DC
  Administered 2020-07-14 – 2020-07-16 (×7): 8 [IU] via SUBCUTANEOUS
  Filled 2020-07-13 (×10): qty 0.08

## 2020-07-13 NOTE — Progress Notes (Signed)
[facility] Progress Note Patient Name: Annette Gibson. Emmerich DOB: [birth year] MRN: [id number]   Date of Service  07/13/2020  HPI/Events of Note  DKA management. Stable POC glucose within target range while patient receiving D5 LR @ 125cc/hr and insulin drip rate 1.3-1.6 u/hr.  Of note, patient has poor appetite so has needed to remain on D5 fluids despite being ordered for a diet.  Was not transitioned off insulin drip earlier in day due to adjusted AG (accounting for albumin of 1.5) remaining high. However, this is very likely due to the elevated AG from her current significant renal dysfunction (BUN 78/Cr 3.34)  rather than her presumably resolved DKA.   eICU Interventions  Transition from insulin drip to [place] insulin: - Lantus 8u [place] BID ordered. - Sensitive sliding scale insulin ordered (Q4H at this time due to dextrose infusion)  Also, will change D5 LR to D5 1/2 NS given normal Na level.     Intervention Category Major Interventions: Hyperglycemia - active titration of insulin therapy  [name] 07/13/2020, 11:29 PM

## 2020-07-13 NOTE — Progress Notes (Signed)
Inpatient Diabetes Program Recommendations  AACE/ADA: New Consensus Statement on Inpatient Glycemic Control (2015)  Target Ranges:  Prepandial:   less than 140 mg/dL      Peak postprandial:   less than 180 mg/dL (1-2 hours)      Critically ill patients:  140 - 180 mg/dL   Lab Results  Component Value Date   GLUCAP 158 (H) 07/13/2020   HGBA1C 11.8 (H) 07/11/2020    Review of Glycemic Control  Diabetes history: DM2 Outpatient Diabetes medications: Glucotrol 5 mg bid + Metformin 1 gm bid Current orders for Inpatient glycemic control: IV insulin  Inpatient Diabetes Program Recommendations:   When ready to transition to subcutaneous insulin, Consider: -Levemir 8 units bid (0.15 units/kg x 102.3 kg = 15 units) -Novolog 4 units tid meal coverage if eats 50% -Novolog 0-9 units tid + hs 0-5 units  Thank you, Annette Roys E. Venora Kautzman, RN, MSN, CDE  Diabetes Coordinator Inpatient Glycemic Control Team Team Pager [phone] (8am-5pm) 07/13/2020 12:51 PM

## 2020-07-13 NOTE — Progress Notes (Signed)
NAME:  Annette Gibson. Tapia, MRN:  [id number], DOB:  [birth year], LOS: 2 ADMISSION DATE:  07/11/2020, CONSULTATION DATE:  2/8 REFERRING MD:  Dr Colvin Caroli, CHIEF COMPLAINT:  Sob and generally fatigued  Brief History   65 yo female presents with 10 days diarrhea, 3 days of lethargy, decreased po intake and sob.   History of present illness   65 yo femal with pmh of copd, diastolic hf, ckd4, dm2, anxiety presents with sob and lethargy. Pt provides own history without great issue. Pt states that last Monday she went to [facility] and became sick shortly after with diarrhea. Since then she has not had good intake but reports she has been cont to take all her medications. She states that she failed to improve and for the past 3 days she has remained in bed and not felt as though she could get up. She endorses continued diarrhea, no n/v no recent sick contacts. She states she has only been able to take in ginger ale. Today she became worsening sob, she does state that she may have been before today but hadn't really moved. She suffers from copd and chronic cough that is non productive and this has not changed.   She did present to [facility] yesterday with BS in 700's and was discharged from ed but failed to improve.  Past Medical History   Past Medical History:  Diagnosis Date  . Anal warts    anal warts  . Anxiety   . CKD (chronic kidney disease)   . Congestive heart failure (CHF) ([place])   . COPD (chronic obstructive pulmonary disease) ([place])   . Diabetes mellitus without complication ([place])   . Hypertriglyceridemia   . Obesity    metabolic syndrome   . Osteopenia   . Vitamin D deficiency     Significant Hospital Events   2/8 admitted  Consults:    Procedures:    Significant Diagnostic Tests:  Echocardiogram 2/9 >> LVEF 02-40%, normal diastolic function, moderately reduced RV function with moderate RV enlargement, mildly elevated PASP  Micro Data:  2/8 strep urine ag >> positive 2/8 leg urine  ag 2/8 blood: >> Pneumococcus >>  2/8 urine:  2/8 GI panel: 2/8 cdiff:  Antimicrobials:  2/8 ceftriaxone >>  2/8 azithro >> 2/9  Interim history/subjective:   Off BiPAP, did wear overnight GI panel has been sent, no more diarrhea  Objective   Blood pressure 124/77, pulse 83, temperature 97.8 F (36.6 C), temperature source Axillary, resp. rate (!) 21, height _0  (1.473 m), weight 102.3 kg, SpO2 98 %.    FiO2 (%):  [100 %] 100 %   Intake/Output Summary (Last 24 hours) at 07/13/2020 9735 Last data filed at 07/13/2020 3299 Gross per 24 hour  Intake 3624.22 ml  Output 640 ml  Net 2984.22 ml   Filed Weights   07/12/20 0234 07/13/20 0500  Weight: 97.9 kg 102.3 kg    Examination:  General: Overweight middle aged female in NAD HEENT: Oropharynx clear, mucous membranes dry Lungs: Rhoncherous on the R, scant wheeze throughout.  Cardiovascular: RRR, no MRG Abdomen: soft, NT, ND Extremities: No acute deformity. Trace peripheral edema.  Skin: No rash Neuro: Awake, alert, oriented, non-focal.  Resolved Hospital Problem list     Assessment & Plan:   Acute on chronic hypoxic resp failure:  Sepsis due to Pneumococcal CAP with bacteremia. - Continue heated high flow [place] (40LPM 100% with sats in low 90s) - Albuterol as needed, consider adding scheduled BD 2/10 -  Continue nocturnal BiPAP as tolerated - Ceftriaxone continue - intermittently follow chest CXR - Incentive spirometry, flutter valve.  - OOB to chair today - Pain control to limit hypoventilation. (moprhine low dose considering renal failure, cannot use NSAID)  COPD: without acute exacerbation - Deferring corticosteroids - Add scheduled duoneb and PRN albuterol  DM2 with hyperglycemia and DKA:  - When corrected for albumin gap remains - Continue insulin infusion and D5 LR - Will repeat labs this morning and will likely be able to transition to SQ.  - Transition back to home glipizide and Metformin once  stabilized from acute events  Shock, hypovolemic and septic in the setting of diarrhea, poor p.o. intake, pneumococcal pneumonia and sepsis.   - Responded to IV fluid resuscitation, has not required pressors  History of HTN:  - Home antihypertensive regimen on hold.   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   ARF on CKD4: slowly improving. Baseline creatinine from November roughly 2.5.  - Follow BMP, urine output - Ensure adequate perfusion, renal dose medications  Diarrhea: Question gastroenteritis due to food, question related to her overall sepsis, pneumonia No longer having diarrhea, no GI panel or C. difficile has been sent.  Discontinue enteric precautions - Supportive care  Normocytic anemia:  - Follow CBC  Best practice:  Diet: diet Pain/Anxiety/Delirium protocol (if indicated): n/a VAP protocol (if indicated): n/a DVT prophylaxis: heparin GI prophylaxis: n/a Glucose control: Drip ongoing Mobility: bedrest Code Status: full Family Communication: Patient updated bedside.  Disposition: ICU  Labs   CBC: Recent Labs  Lab 07/11/20 1738 07/11/20 1751 07/12/20 0625 07/13/20 0408  WBC 11.9*  --  9.4 13.9*  NEUTROABS 10.2*  --   --   --   HGB 10.8* 11.6* 10.4* 10.4*  HCT 35.4* 34.0* 33.8* 34.1*  MCV 100.0  --  100.0 99.4  PLT PLATELET CLUMPS NOTED ON SMEAR, UNABLE TO ESTIMATE  --  173 594    Basic Metabolic Panel: Recent Labs  Lab 07/11/20 1738 07/11/20 1751 07/12/20 0625 07/12/20 1105 07/12/20 1552 07/12/20 1927  NA 134* 134* 136 139 140 139  K 4.8 4.8 5.5* 4.2 4.8 3.7  CL 93*  --  98 101 105 103  CO2 23  --  _0 GLUCOSE 534*  --  455* 236* 66* 111*  BUN 79*  --  80* 78* 80* 75*  CREATININE 4.76*  --  4.19* 3.98* 3.77* 3.71*  CALCIUM 9.1  --  8.6* 9.2 9.2 9.3  MG  --   --  2.0  --   --   --   PHOS   --   --  5.0*  --   --   --    GFR: Estimated Creatinine Clearance: 15.8 mL/min (A) (by C-G formula based on SCr of 3.71 mg/dL (H)). Recent Labs  Lab 07/11/20 1738 07/11/20 1942 07/11/20 2114 07/12/20 0121 07/12/20 0551 07/12/20 0625 07/13/20 0408  PROCALCITON  --   --  37.25  --   --   --   --   WBC 11.9*  --   --   --   --  9.4 13.9*  LATICACIDVEN 2.7* 3.1*  --  2.8* 2.3*  --   --     Liver Function Tests: Recent Labs  Lab 07/11/20 1738  AST 36  ALT 23  ALKPHOS  61  BILITOT 1.6*  PROT 6.1*  ALBUMIN 2.1*   No results for input(s): LIPASE, AMYLASE in the last 168 hours. No results for input(s): AMMONIA in the last 168 hours.  ABG    Component Value Date/Time   HCO3 27.0 07/11/2020 1751   TCO2 29 07/11/2020 1751   ACIDBASEDEF 1.0 07/11/2020 1751   O2SAT 85.0 07/11/2020 1751     Coagulation Profile: Recent Labs  Lab 07/11/20 1738  INR 1.3*    Cardiac Enzymes: No results for input(s): CKTOTAL, CKMB, CKMBINDEX, TROPONINI in the last 168 hours.  HbA1C: HB A1C (BAYER DCA - WAIVED)  Date/Time Value Ref Range Status  01/20/2018 11:21 AM 6.1 <7.0 % Final    Comment:                                          Diabetic Adult            <7.0                                       Healthy Adult        4.3 - 5.7                                                           (DCCT/NGSP) American Diabetes Association's Summary of Glycemic Recommendations for Adults with Diabetes: Hemoglobin A1c <7.0%. More stringent glycemic goals (A1c <6.0%) may further reduce complications at the cost of increased risk of hypoglycemia.   10/14/2017 10:19 AM 6.8 <7.0 % Final    Comment:                                          Diabetic Adult            <7.0                                       Healthy Adult        4.3 - 5.7                                                           (DCCT/NGSP) American Diabetes Association's Summary of Glycemic Recommendations for Adults with Diabetes:  Hemoglobin A1c <7.0%. More stringent glycemic goals (A1c <6.0%) may further reduce complications at the cost of increased risk of hypoglycemia.    Hgb A1c MFr Bld  Date/Time Value Ref Range Status  07/11/2020 09:19 PM 11.8 (H) 4.8 - 5.6 % Final    Comment:    (NOTE) Pre diabetes:          5.7%-6.4%  Diabetes:              >6.4%  Glycemic control for   <7.0% adults with diabetes  CBG: Recent Labs  Lab 07/13/20 0058 07/13/20 0305 07/13/20 0419 07/13/20 0602 07/13/20 0716  GLUCAP 79 83 95 113* 126*     Critical care time: 46 minutes      Georgann Housekeeper, AGACNP-BC [facility]  See Amion for personal pager PCCM on call pager [phone] until 7pm. Please call Elink 7p-7a. [phone]  07/13/2020 9:27 AM

## 2020-07-14 DIAGNOSIS — N179 Acute kidney failure, unspecified: Secondary | ICD-10-CM | POA: Diagnosis not present

## 2020-07-14 DIAGNOSIS — N184 Chronic kidney disease, stage 4 (severe): Secondary | ICD-10-CM | POA: Diagnosis not present

## 2020-07-14 DIAGNOSIS — J9601 Acute respiratory failure with hypoxia: Secondary | ICD-10-CM | POA: Diagnosis not present

## 2020-07-14 LAB — BASIC METABOLIC PANEL
Anion gap: 8 (ref 5–15)
Anion gap: 11 (ref 5–15)
BUN: 72 mg/dL — ABNORMAL HIGH (ref 8–23)
BUN: 70 mg/dL — ABNORMAL HIGH (ref 8–23)
CO2: 30 mmol/L (ref 22–32)
CO2: 25 mmol/L (ref 22–32)
Calcium: 9.4 mg/dL (ref 8.9–10.3)
Calcium: 9.5 mg/dL (ref 8.9–10.3)
Chloride: 103 mmol/L (ref 98–111)
Chloride: 106 mmol/L (ref 98–111)
Creatinine, Ser: 3 mg/dL — ABNORMAL HIGH (ref 0.44–1.00)
Creatinine, Ser: 2.73 mg/dL — ABNORMAL HIGH (ref 0.44–1.00)
GFR, Estimated: 17 mL/min — ABNORMAL LOW (ref >60)
GFR, Estimated: 19 mL/min — ABNORMAL LOW (ref >60)
Glucose, Bld: 163 mg/dL — ABNORMAL HIGH (ref 70–99)
Glucose, Bld: 124 mg/dL — ABNORMAL HIGH (ref 70–99)
Potassium: 3.7 mmol/L (ref 3.5–5.1)
Potassium: 4.1 mmol/L (ref 3.5–5.1)
Sodium: 141 mmol/L (ref 135–145)
Sodium: 142 mmol/L (ref 135–145)

## 2020-07-14 LAB — CBC
HCT: 32.7 % — ABNORMAL LOW (ref 36.0–46.0)
Hemoglobin: 10.2 g/dL — ABNORMAL LOW (ref 12.0–15.0)
MCH: 31.3 pg (ref 26.0–34.0)
MCHC: 31.2 g/dL (ref 30.0–36.0)
MCV: 100.3 fL — ABNORMAL HIGH (ref 80.0–100.0)
Platelets: 239 10*3/uL (ref 150–400)
RBC: 3.26 MIL/uL — ABNORMAL LOW (ref 3.87–5.11)
RDW: 12.8 % (ref 11.5–15.5)
WBC: 16.9 10*3/uL — ABNORMAL HIGH (ref 4.0–10.5)
nRBC: 0.5 % — ABNORMAL HIGH (ref 0.0–0.2)

## 2020-07-14 LAB — CULTURE, BLOOD (ROUTINE X 2): Special Requests: ADEQUATE

## 2020-07-14 LAB — GLUCOSE, CAPILLARY
Glucose-Capillary: 107 mg/dL — ABNORMAL HIGH (ref 70–99)
Glucose-Capillary: 129 mg/dL — ABNORMAL HIGH (ref 70–99)
Glucose-Capillary: 133 mg/dL — ABNORMAL HIGH (ref 70–99)
Glucose-Capillary: 134 mg/dL — ABNORMAL HIGH (ref 70–99)
Glucose-Capillary: 138 mg/dL — ABNORMAL HIGH (ref 70–99)
Glucose-Capillary: 138 mg/dL — ABNORMAL HIGH (ref 70–99)
Glucose-Capillary: 152 mg/dL — ABNORMAL HIGH (ref 70–99)
Glucose-Capillary: 159 mg/dL — ABNORMAL HIGH (ref 70–99)
Glucose-Capillary: 182 mg/dL — ABNORMAL HIGH (ref 70–99)

## 2020-07-14 LAB — PHOSPHORUS: Phosphorus: 3.5 mg/dL (ref 2.5–4.6)

## 2020-07-14 LAB — MAGNESIUM: Magnesium: 1.7 mg/dL (ref 1.7–2.4)

## 2020-07-14 MED ORDER — IPRATROPIUM-ALBUTEROL 0.5-2.5 (3) MG/3ML IN SOLN
3.0000 mL | Freq: Four times a day (QID) | RESPIRATORY_TRACT | Status: DC
  Administered 2020-07-14 – 2020-07-16 (×8): 3 mL via RESPIRATORY_TRACT
  Filled 2020-07-14 (×7): qty 3

## 2020-07-14 MED ORDER — IPRATROPIUM-ALBUTEROL 0.5-2.5 (3) MG/3ML IN SOLN
RESPIRATORY_TRACT | Status: AC
  Administered 2020-07-14: 3 mL
  Filled 2020-07-14: qty 3

## 2020-07-14 NOTE — Progress Notes (Signed)
Spoke with patient on her cell phone. Patient was diagnosed with diabetes "many years ago".Has been on Glipizide at home, but was taken off Metformin by her PCP. She has never taken insulin and would rather not. Encouraged her to try giving herself an injection with the nurse while in the hospital, but she seemed a bit afraid at the thought. Discussed her HgbA1C of 11.8% being close to 275 mg/dl on an average over the last 2-3 months. States that she is getting a new home blood glucose meter, strips, and lancets mailed to her soon. She has been instructed by her PCP to check CBGs twice per day. States that she lives with her daughter and they share the cooking chores.   Will continue to monitor blood sugars while in the hospital.  Harvel Ricks RN BSN CDE Diabetes Coordinator Pager: [phone]  8am-5pm

## 2020-07-14 NOTE — Progress Notes (Signed)
NAME:  Annette Gibson. Lienhard, MRN:  [id number], DOB:  1956/03/15, LOS: 3 ADMISSION DATE:  07/11/2020, CONSULTATION DATE:  2/8 REFERRING MD:  Dr Colvin Caroli, CHIEF COMPLAINT:  Sob and generally fatigued  Brief History   65 yo female presents with 10 days diarrhea, 3 days of lethargy, decreased po intake and sob.   History of present illness   65 yo femal with pmh of copd, diastolic hf, ckd4, dm2, anxiety presents with sob and lethargy. Pt provides own history without great issue. Pt states that last Monday she went to [facility] and became sick shortly after with diarrhea. Since then she has not had good intake but reports she has been cont to take all her medications. She states that she failed to improve and for the past 3 days she has remained in bed and not felt as though she could get up. She endorses continued diarrhea, no n/v no recent sick contacts. She states she has only been able to take in ginger ale. Today she became worsening sob, she does state that she may have been before today but hadn't really moved. She suffers from copd and chronic cough that is non productive and this has not changed.   She did present to [facility] yesterday with BS in 700's and was discharged from ed but failed to improve.  Past Medical History   Past Medical History:  Diagnosis Date  . Anal warts    anal warts  . Anxiety   . CKD (chronic kidney disease)   . Congestive heart failure (CHF) ([place])   . COPD (chronic obstructive pulmonary disease) ([place])   . Diabetes mellitus without complication ([place])   . Hypertriglyceridemia   . Obesity    metabolic syndrome   . Osteopenia   . Vitamin D deficiency     Significant Hospital Events   2/8 admitted  Consults:    Procedures:    Significant Diagnostic Tests:  Echocardiogram 2/9 >> LVEF 46-65%, normal diastolic function, moderately reduced RV function with moderate RV enlargement, mildly elevated PASP  Micro Data:  2/8 strep urine ag >> positive 2/8 leg urine  ag 2/8 blood: >> Pneumococcus >>  2/8 urine:  2/8 GI panel: 2/8 cdiff:  Antimicrobials:  2/8 ceftriaxone >>  2/8 azithro >> 2/9  Interim history/subjective:   Wore BiPAP overnight Transitioned to sliding scale overnight, anion gap closed I/O +7.4 L total, urine output 2300 cc last 24 hours (improved)  Objective   Blood pressure 128/63, pulse 82, temperature 98.8 F (37.1 C), temperature source Oral, resp. rate (!) 23, height _0  (1.473 m), weight 104.8 kg, SpO2 97 %.    FiO2 (%):  [100 %] 100 %   Intake/Output Summary (Last 24 hours) at 07/14/2020 1108 Last data filed at 07/14/2020 1100 Gross per 24 hour  Intake 2710.29 ml  Output 2100 ml  Net 610.29 ml   Filed Weights   07/12/20 0234 07/13/20 0500 07/14/20 0500  Weight: 97.9 kg 102.3 kg 104.8 kg    Examination:  General: Overweight woman, no distress on nasal cannula, occasionally coughing HEENT: Oropharynx clear, pressure injury on bridge of nose with a bandage in place Lungs: Rhonchorous on the right, scant wheezing throughout Cardiovascular: Regular, distant, no murmur Abdomen: Soft, nondistended, positive bowel sounds Extremities: No deformities, trace edema Skin: No rash Neuro: Awake, alert and oriented, moves all extremities with good strength  Resolved Hospital Problem list     Assessment & Plan:   Acute on chronic hypoxic resp failure:  Sepsis due to Pneumococcal CAP with bacteremia. Continue heated high flow by nasal cannula and titrate Continue nocturnal BiPAP Continue ceftriaxone Push pulmonary hygiene, out of bed Pain control for right pleuritic pain, avoiding NSAIDs for now given her renal dysfunction  COPD: without acute exacerbation Deferred corticosteroids Scheduled DuoNeb, albuterol as needed   DM2 with hyperglycemia and DKA:  Transitioned off insulin drip overnight Continue sliding scale, Levemir as ordered KVO IV fluids Transition back to glipizide to Metformin once stabilized  from acute events  Shock, hypovolemic and septic in the setting of diarrhea, poor p.o. intake, pneumococcal pneumonia and sepsis.   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 Baseline creatinine from November roughly 2.5.  Slight improvement 2/11 Following BMP, urine output Ensure adequate renal perfusion, renal dose medications, avoid nephrotoxins  Diarrhea: Question gastroenteritis due to food, question related to her overall sepsis, pneumonia No longer having diarrhea, no C. difficile has been sent.   Discontinued enteric precautions Supportive care Tolerating p.o. diet  Normocytic anemia:  Follow CBC  Best practice:  Diet: diet Pain/Anxiety/Delirium protocol (if indicated): n/a VAP protocol (if indicated): n/a DVT prophylaxis: heparin GI prophylaxis: n/a Glucose control: SSI Mobility: bedrest Code Status: full Family Communication: Patient updated bedside Disposition: ICU  Labs   CBC: Recent Labs  Lab 07/11/20 1738 07/11/20 1751 07/12/20 0625 07/13/20 0408 07/14/20 0402  WBC 11.9*  --  9.4 13.9* 16.9*  NEUTROABS 10.2*  --   --   --   --   HGB 10.8* 11.6* 10.4* 10.4* 10.2*  HCT 35.4* 34.0* 33.8* 34.1* 32.7*  MCV 100.0  --  100.0 99.4 100.3*  PLT PLATELET CLUMPS NOTED ON SMEAR, UNABLE TO ESTIMATE  --  173 208 637    Basic Metabolic Panel: Recent Labs  Lab 07/12/20 0625 07/12/20 1105 07/12/20 1927 07/13/20 0920 07/13/20 1539 07/13/20 2253 07/14/20 0402  NA 136   < > 139 140 139 142 141  K 5.5*   < > 3.7 3.9 3.8 3.8 3.7  CL 98   < > 103 102 102 103 103  CO2 23   < > _0 GLUCOSE 455*   < > 111* 205* 172* 150* 163*   BUN 80*   < > 75* 79* 78* 75* 72*  CREATININE 4.19*   < > 3.71* 3.54* 3.34* 3.09* 3.00*  CALCIUM 8.6*   < > 9.3 9.4 9.5 9.3 9.4  MG 2.0  --   --   --   --   --  1.7  PHOS 5.0*  --   --   --   --   --  3.5   < > = values in this interval not displayed.   GFR: Estimated Creatinine Clearance: 19.9 mL/min (A) (by C-G formula based on SCr of 3 mg/dL (H)). Recent Labs  Lab 07/11/20 1738 07/11/20 1942 07/11/20 2114 07/12/20 0121 07/12/20 0551 07/12/20 0625 07/13/20 0408 07/14/20 0402  PROCALCITON  --   --  37.25  --   --   --   --   --   WBC 11.9*  --   --   --   --  9.4 13.9* 16.9*  LATICACIDVEN  2.7* 3.1*  --  2.8* 2.3*  --   --   --     Liver Function Tests: Recent Labs  Lab 07/11/20 1738 07/13/20 0920  AST 36  --   ALT 23  --   ALKPHOS 61  --   BILITOT 1.6*  --   PROT 6.1*  --   ALBUMIN 2.1* 1.5*   No results for input(s): LIPASE, AMYLASE in the last 168 hours. No results for input(s): AMMONIA in the last 168 hours.  ABG    Component Value Date/Time   HCO3 27.0 07/11/2020 1751   TCO2 29 07/11/2020 1751   ACIDBASEDEF 1.0 07/11/2020 1751   O2SAT 85.0 07/11/2020 1751     Coagulation Profile: Recent Labs  Lab 07/11/20 1738  INR 1.3*    Cardiac Enzymes: No results for input(s): CKTOTAL, CKMB, CKMBINDEX, TROPONINI in the last 168 hours.  HbA1C: HB A1C (BAYER DCA - WAIVED)  Date/Time Value Ref Range Status  01/20/2018 11:21 AM 6.1 <7.0 % Final    Comment:                                          Diabetic Adult            <7.0                                       Healthy Adult        4.3 - 5.7                                                           (DCCT/NGSP) American Diabetes Association's Summary of Glycemic Recommendations for Adults with Diabetes: Hemoglobin A1c <7.0%. More stringent glycemic goals (A1c <6.0%) may further reduce complications at the cost of increased risk of hypoglycemia.   10/14/2017 10:19 AM 6.8 <7.0 % Final    Comment:                                           Diabetic Adult            <7.0                                       Healthy Adult        4.3 - 5.7                                                           (DCCT/NGSP) American Diabetes Association's Summary of Glycemic Recommendations for Adults with Diabetes: Hemoglobin A1c <7.0%. More stringent glycemic goals (A1c <6.0%) may further reduce complications at the cost of increased risk of hypoglycemia.    Hgb A1c MFr Bld  Date/Time Value Ref Range Status  07/11/2020 09:19  PM 11.8 (H) 4.8 - 5.6 % Final    Comment:    (NOTE) Pre diabetes:          5.7%-6.4%  Diabetes:              >6.4%  Glycemic control for   <7.0% adults with diabetes     CBG: Recent Labs  Lab 07/14/20 0002 07/14/20 0104 07/14/20 0202 07/14/20 0330 07/14/20 0745  GLUCAP 138* 134* 138* 129* 159*     Critical care time: 31 minutes     Baltazar Apo, MD, PhD 07/14/2020, 11:15 AM  Pulmonary and Critical Care [phone] or if no answer before 7:00PM call [phone] For any issues after 7:00PM please call eLink [phone]

## 2020-07-14 NOTE — TOC Initial Note (Signed)
Transition of Care (TOC) - Initial/Assessment Note    Patient Details  Name: Annette Gibson MRN: [id number] Date of Birth: 04-24-1956  Transition of Care [facility]) CM/SW Contact:    Bartholomew Crews, RN Phone Number: [phone] 07/14/2020, 11:48 AM  Clinical Narrative:                  Acknowledging TOC consult for home oxygen. Spoke with patient on her mobile phone briefly. Patient stated that she has oxygen at home but equipment is broken. Patient could not recall the name of the oxygen provider. Patient provided verbal consent to speak with her daughter, Annette Gibson. Call went straight to voicemail - contact information left for call back.   Spoke with Thedore Mins at [place]. He found charity history of concentrator provided to patient several years ago. Patient now has insurance. [employer] to set up patient as a new start at time of discharge. Will need DME order and ambulatory sat note when patient nears readiness to transition home.   TOC following for transition needs.    Barriers to Discharge: Continued Medical Work up   Patient Goals and CMS Choice        Expected Discharge Plan and Services     Discharge Planning Services: CM Consult                     DME Arranged: Oxygen                    Prior Living Arrangements/Services     Patient language and need for interpreter reviewed:: Yes            Current home services: DME (oxygen) Criminal Activity/Legal Involvement Pertinent to Current Situation/Hospitalization: No - Comment as needed  Activities of Daily Living Home Assistive Devices/Equipment: Oxygen ADL Screening (condition at time of admission) Patient's cognitive ability adequate to safely complete daily activities?: Yes Is the patient deaf or have difficulty hearing?: No Does the patient have difficulty seeing, even when wearing glasses/contacts?: No Does the patient have difficulty concentrating, remembering, or making decisions?: No Patient able to  express need for assistance with ADLs?: Yes Does the patient have difficulty dressing or bathing?: No Independently performs ADLs?: Yes (appropriate for developmental age) Does the patient have difficulty walking or climbing stairs?: Yes Weakness of Legs: Both Weakness of Arms/Hands: Both  Permission Sought/Granted Permission sought to share information with : Family Supports    Share Information with NAME: Kashmere Daywalt     Permission granted to share info w Relationship: daughter  Permission granted to share info w Contact Information: [phone]  Emotional Assessment       Orientation: : Oriented to Self,Oriented to  Time,Oriented to Place,Oriented to Situation Alcohol / Substance Use: Not Applicable Psych Involvement: No (comment)  Admission diagnosis:  Hyperglycemia [R73.9] Elevated troponin [R77.8] Acute respiratory failure with hypoxia ([place]) [J96.01] Acute hypoxemic respiratory failure ([place]) [J96.01] Community acquired pneumonia, unspecified laterality [J18.9] Patient Active Problem List   Diagnosis Date Noted  . Pneumonia of right lung due to Streptococcus pneumoniae ([place])   . Acute renal failure superimposed on chronic kidney disease ([place])   . Diabetic ketoacidosis without coma associated with type 2 diabetes mellitus ([place])   . Acute respiratory failure with hypoxia ([place]) 07/11/2020  . Elevated ALT measurement 02/13/2020  . Positive colorectal cancer screening using Cologuard test 02/11/2020  . Elevated LFTs 02/11/2020  . CHF, acute on chronic ([place]) 05/23/2017  . Hypokalemia 05/23/2017  . BMI 38.0-38.9,adult  12/08/2015  . Hypertension 10/09/2012  . Hyperlipidemia 10/09/2012  . Peripheral edema 10/09/2012  . COPD (chronic obstructive pulmonary disease) ([place]) 10/09/2012  . GAD (generalized anxiety disorder) 10/09/2012  . Vitamin D deficiency 10/09/2012  . Diabetes ([place]) 10/09/2012  . Osteopenia 10/09/2012   PCP:  Bridget Hartshorn, NP Pharmacy:   [facility] [address] [place] [address] [place] [address] Phone: [phone] Fax: [phone]     Social Determinants of Health (SDOH) Interventions    Readmission Risk Interventions No flowsheet data found.

## 2020-07-14 NOTE — TOC Progression Note (Signed)
Transition of Care (TOC) - Progression Note    Patient Details  Name: Annette Gibson MRN: [id number] Date of Birth: [birth year]  Transition of Care [facility]) CM/SW Contact  Bartholomew Crews, RN Phone Number: [phone] 07/14/2020, 3:24 PM  Clinical Narrative:     Received call from patient's daughter, Roselyn Reef [phone], who was responding to message left for her sister, Coralyn Helling. Roselyn Reef stated that D[place]on lives with patient, however, she is the Therapist, nutritional of the details. Verified demographics and PCP. Roselyn Reef will provide transportation home at time of discharge.  Roselyn Reef confirmed that previous oxygen had been provided by [facility] several years ago. Advised that [employer] would provide home oxygen. Will need DME oxygen order and ambulatory saturation note when ready to transition home.   Discussed need for [facility] RN, PT - reviewed agency choice, referral accepted by [facility]. Will need HH RN/PT orders with Face to Face when ready for discharge.   PTA patient was independent with basic ADLs/IADLs.   TOC following for transition needs.    Barriers to Discharge: Continued Medical Work up  Expected Discharge Plan and Services     Discharge Planning Services: CM Consult                     DME Arranged: Oxygen                     Social Determinants of Health (SDOH) Interventions    Readmission Risk Interventions No flowsheet data found.

## 2020-07-15 ENCOUNTER — Inpatient Hospital Stay (HOSPITAL_COMMUNITY): Payer: Medicare HMO

## 2020-07-15 DIAGNOSIS — J9601 Acute respiratory failure with hypoxia: Secondary | ICD-10-CM | POA: Diagnosis not present

## 2020-07-15 LAB — BASIC METABOLIC PANEL
Anion gap: 12 (ref 5–15)
BUN: 68 mg/dL — ABNORMAL HIGH (ref 8–23)
CO2: 25 mmol/L (ref 22–32)
Calcium: 9.6 mg/dL (ref 8.9–10.3)
Chloride: 104 mmol/L (ref 98–111)
Creatinine, Ser: 2.65 mg/dL — ABNORMAL HIGH (ref 0.44–1.00)
GFR, Estimated: 20 mL/min — ABNORMAL LOW (ref >60)
Glucose, Bld: 102 mg/dL — ABNORMAL HIGH (ref 70–99)
Potassium: 3.8 mmol/L (ref 3.5–5.1)
Sodium: 141 mmol/L (ref 135–145)

## 2020-07-15 LAB — GLUCOSE, CAPILLARY
Glucose-Capillary: 109 mg/dL — ABNORMAL HIGH (ref 70–99)
Glucose-Capillary: 149 mg/dL — ABNORMAL HIGH (ref 70–99)
Glucose-Capillary: 184 mg/dL — ABNORMAL HIGH (ref 70–99)
Glucose-Capillary: 194 mg/dL — ABNORMAL HIGH (ref 70–99)
Glucose-Capillary: 206 mg/dL — ABNORMAL HIGH (ref 70–99)
Glucose-Capillary: 87 mg/dL (ref 70–99)

## 2020-07-15 LAB — CBC
HCT: 33 % — ABNORMAL LOW (ref 36.0–46.0)
Hemoglobin: 10.6 g/dL — ABNORMAL LOW (ref 12.0–15.0)
MCH: 31.5 pg (ref 26.0–34.0)
MCHC: 32.1 g/dL (ref 30.0–36.0)
MCV: 98.2 fL (ref 80.0–100.0)
Platelets: 200 10*3/uL (ref 150–400)
RBC: 3.36 MIL/uL — ABNORMAL LOW (ref 3.87–5.11)
RDW: 13 % (ref 11.5–15.5)
WBC: 19.4 10*3/uL — ABNORMAL HIGH (ref 4.0–10.5)
nRBC: 0.1 % (ref 0.0–0.2)

## 2020-07-15 LAB — PHOSPHORUS: Phosphorus: 4.2 mg/dL (ref 2.5–4.6)

## 2020-07-15 LAB — MAGNESIUM: Magnesium: 1.6 mg/dL — ABNORMAL LOW (ref 1.7–2.4)

## 2020-07-15 MED ORDER — FUROSEMIDE 10 MG/ML IJ SOLN
40.0000 mg | Freq: Once | INTRAMUSCULAR | Status: AC
  Administered 2020-07-15: 40 mg via INTRAVENOUS
  Filled 2020-07-15: qty 4

## 2020-07-15 MED ORDER — MAGNESIUM SULFATE 4 GM/100ML IV SOLN
4.0000 g | Freq: Once | INTRAVENOUS | Status: AC
  Administered 2020-07-15: 4 g via INTRAVENOUS
  Filled 2020-07-15: qty 100

## 2020-07-15 MED ORDER — MAGNESIUM SULFATE 4 GM/100ML IV SOLN: 4.0000 g | Freq: Once | INTRAVENOUS | Status: DC

## 2020-07-15 NOTE — Progress Notes (Signed)
PT Cancellation Note  Patient Details Name: Annette Gibson. [place] MRN: [id number] DOB: Mar 10, 1956   Cancelled Treatment:    Reason Eval/Treat Not Completed: Fatigue/lethargy limiting ability to participate. Pt reporting complete exhaustion after recently returning to bed from the chair. Pt declining PT evaluation at this time. PT will follow up as time allows.   Zenaida Niece 07/15/2020, 3:53 PM

## 2020-07-15 NOTE — Progress Notes (Signed)
NAME:  Annette Gibson. Conkel, MRN:  [id number], DOB:  11-29-1955, LOS: 4 ADMISSION DATE:  07/11/2020, CONSULTATION DATE:  2/8 REFERRING MD:  Dr Colvin Caroli, CHIEF COMPLAINT:  Sob and generally fatigued  Brief History    65 yo femal with pmh of copd, diastolic hf, ckd4, dm2, anxiety presents with sob and lethargy. Pt provides own history without great issue. Pt states that last Monday she went to [facility] and became sick shortly after with diarrhea. Since then she has not had good intake but reports she has been cont to take all her medications. She states that she failed to improve and for the past 3 days she has remained in bed and not felt as though she could get up. She endorses continued diarrhea, no n/v no recent sick contacts. She states she has only been able to take in ginger ale. Today she became worsening sob, she does state that she may have been before today but hadn't really moved. She suffers from copd and chronic cough that is non productive and this has not changed.   She did present to [facility] yesterday with BS in 700's and was discharged from ed but failed to improve.  Past Medical History   Past Medical History:  Diagnosis Date  . Anal warts    anal warts  . Anxiety   . CKD (chronic kidney disease)   . Congestive heart failure (CHF) ([place])   . COPD (chronic obstructive pulmonary disease) ([place])   . Diabetes mellitus without complication ([place])   . Hypertriglyceridemia   . Obesity    metabolic syndrome   . Osteopenia   . Vitamin D deficiency     Significant Hospital Events   2/8 admitted  2/11 - Wore BiPAP overnight Transitioned to sliding scale overnight, anion gap closed I/O +7.4 L total, urine output 2300 cc last 24 hours (improved)  Consults:    Procedures:    Significant Diagnostic Tests:  Echocardiogram 2/9 >> LVEF 73-71%, normal diastolic function, moderately reduced RV function with moderate RV enlargement, mildly elevated PASP  Micro Data:  2/8 strep urine ag >>  positive 2/8 leg urine ag 2/8 blood: >> Pneumococcus >>  2/8 urine:  2/8 GI panel: 2/8 cdiff:  Antimicrobials:  2/8 ceftriaxone >>  2/8 azithro >> 2/9  Interim history/subjective:   07/15/2020: Afebrile/low-grade fever.  White count rising to 19.4 on FiO2 90% heated high flow nasal cannula 40 L/min.  Pulse ox 95%.  She does not feel any better since admission.  She does not like the BiPAP because it is hurting her nose and face.  She has reluctantly used it overnight. Objective   Blood pressure (!) 143/74, pulse 95, temperature 98.6 F (37 C), temperature source Axillary, resp. rate 16, height _0  (1.473 m), weight 105.3 kg, SpO2 95 %.    FiO2 (%):  [90 %-100 %] 90 %   Intake/Output Summary (Last 24 hours) at 07/15/2020 1030 Last data filed at 07/15/2020 0800 Gross per 24 hour  Intake 800 ml  Output 1300 ml  Net -500 ml   Filed Weights   07/13/20 0500 07/14/20 0500 07/15/20 0500  Weight: 102.3 kg 104.8 kg 105.3 kg    Examination:  Morbidly obese female on heated high flow nasal oxygen alert and oriented x3.  Clear to auscultation but distant breath sounds abdomen soft but obese.  Normal heart sounds.  Extremities with some edema but mostly obesity.  They tried to sit her up into the chair.  Moves all  4 extremities.  Resolved Hospital Problem list    Shock, hypovolemic and septic in the setting of diarrhea, poor p.o. intake, pneumococcal pneumonia and sepsis.   Responded to IV fluid resuscitation  Diarrhea from pneumococcal pneumonia: Resolved 07/14/2020  Assessment & Plan:   Acute on chronic hypoxic resp failure due to pneumococcal pneumonia with bacteremia   07/15/2020: Still on 40 L heated high flow nasal cannula 90% FiO2.  Morbid obesity barrier for rapid improvement.  Reluctantly only wearing BiPAP at night  Plan -BiPAP nightly (thought she is reluctant) -Daytime heated high flow nasal cannula -Goal pulse ox greater than 92%. -Incentive spirometry -Out of bed  to the chair  Sepsis due to Pneumococcal CAP with bacteremia.   -07/15/2020: Afebrile but white count rising.  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 Baseline creatinine from November roughly 2.5.    07/15/2020: Creatinine improved further 2.6 mg percent  Plan Following BMP, urine output Ensure adequate renal perfusion, renal dose medications, avoid nephrotoxins   Volume overload  07/15/2020 - +7L   Plan  - lasix x 1  Electrolyte imbalance  07/15/2020 - Mag 1.6. Low mag and repleted  Plan  - monitor   History of HTN:  Home antihypertensive regimen on hold  Elevated BNP and Trop, stress non-STEMI:   Plan Telemetry  DM2 with hyperglycemia and DKA:  Transitioned off insulin drip overnight 2/10-2/11  plan Continue sliding scale, Levemir as ordered KVO IV fluids Transition back to glipizide to Metformin once stabilized from acute events   Normocytic anemia and anemia of critical illness/chronic disease  07/15/2020: No bleeding  Plan  - - PRBC for hgb </= 6.9gm%    - exceptions are   -  if ACS susepcted/confirmed then transfuse for hgb </= 8.0gm%,  or    -  active bleeding with hemodynamic instability, then transfuse regardless of hemoglobin value   At at all times try to transfuse 1 unit prbc as possible with exception of active hemorrhage    Best practice:  Diet: diet Pain/Anxiety/Delirium protocol (if indicated): n/a VAP protocol (if indicated): n/a DVT prophylaxis: heparin GI prophylaxis: n/a Glucose control: SSI Mobility: bedrest Code Status: full Family Communication: Patient updated bedside -> patient 2/122/22 and then called  daughte Dena [phone] -> went to VM/LMTCB  Disposition: ICU      ATTESTATION & SIGNATURE   The patient [name] is critically ill with multiple organ systems failure and requires high complexity decision making for assessment and support, frequent evaluation and titration of therapies, application of advanced monitoring technologies and extensive interpretation of multiple databases.   Critical Care Time devoted to patient care services described in this note is  31  Minutes. This time reflects time of care of this signee Dr Brand Males. This critical care time does not reflect procedure time, or teaching time or supervisory time of PA/NP/Med student/Med Resident etc but could involve care discussion time     Dr. Brand Males, M.D., [facility].C.P Pulmonary and Critical Care Medicine Staff Physician [facility] Pager: [phone], If no answer or between  15:00h - 7:00h: call [phone]  07/15/2020 10:30 AM  LABS    PULMONARY Recent Labs  Lab 07/11/20 1751  HCO3 27.0  TCO2 29  O2SAT 85.0    CBC Recent Labs  Lab 07/13/20 0408 07/14/20 0402 07/15/20 0237  HGB 10.4* 10.2* 10.6*  HCT 34.1* 32.7* 33.0*  WBC 13.9* 16.9* 19.4*  PLT 208 239 200    COAGULATION Recent Labs  Lab 07/11/20 1738  INR 1.3*    CARDIAC  No results for input(s): TROPONINI in the last 168 hours. No results for input(s): PROBNP in the last 168 hours.   CHEMISTRY Recent Labs  Lab 07/12/20 0625 07/12/20 1105 07/13/20 1539 07/13/20 2253 07/14/20 0402 07/14/20 2206 07/15/20 0237  NA 136   < > 139 142 141 142 141  K 5.5*   < > 3.8 3.8 3.7 4.1 3.8  CL 98   < > 102 103 103 106 104  CO2 23   < > _0 GLUCOSE 455*   < > 172* 150* 163* 124* 102*  BUN 80*   < > 78* 75* 72* 70* 68*  CREATININE 4.19*   < > 3.34* 3.09* 3.00* 2.73* 2.65*  CALCIUM 8.6*   < > 9.5 9.3 9.4 9.5 9.6  MG 2.0  --   --   --  1.7  --  1.6*   PHOS 5.0*  --   --   --  3.5  --  4.2   < > = values in this interval not displayed.   Estimated Creatinine Clearance: 22.6 mL/min (A) (by C-G formula based on SCr of 2.65 mg/dL (H)).   LIVER Recent Labs  Lab 07/11/20 1738 07/13/20 0920  AST 36  --   ALT 23  --   ALKPHOS 61  --   BILITOT 1.6*  --   PROT 6.1*  --   ALBUMIN 2.1* 1.5*  INR 1.3*  --      INFECTIOUS Recent Labs  Lab 07/11/20 1942 07/11/20 2114 07/12/20 0121 07/12/20 0551  LATICACIDVEN 3.1*  --  2.8* 2.3*  PROCALCITON  --  37.25  --   --      ENDOCRINE CBG (last 3)  Recent Labs    07/14/20 2323 07/15/20 0315 07/15/20 0743  GLUCAP 107* 109* 87         IMAGING x48h  - image(s) personally visualized  -   highlighted in bold DG Chest Port 1 View  Result Date: 07/15/2020 CLINICAL DATA:  Pneumonia. EXAM: PORTABLE CHEST 1 VIEW COMPARISON:  Chest x-ray dated July 13, 2020. FINDINGS: The heart size and mediastinal contours are within normal limits. Normal pulmonary vascularity. Diffuse opacity throughout the right lung, worsened in the interval and now involving the right apex. Minimal left basilar atelectasis. The left lung is otherwise clear. Unchanged small right pleural effusion. No pneumothorax. No acute osseous abnormality. IMPRESSION: 1. Worsening multifocal pneumonia in the right lung. 2. Unchanged small right pleural effusion. Electronically Signed   By: Titus Dubin M.D.   On: 07/15/2020 07:27

## 2020-07-15 NOTE — Progress Notes (Signed)
[facility] Progress Note Patient Name: Annette Gibson DOB: [birth year] MRN: [id number]   Date of Service  07/15/2020  HPI/Events of Note  Mg++ 1.6.  eICU Interventions  4 grams Mg++ ordered per protocol.        Kerry Kass Ogan 07/15/2020, 6:09 AM

## 2020-07-16 DIAGNOSIS — R7881 Bacteremia: Secondary | ICD-10-CM

## 2020-07-16 DIAGNOSIS — A419 Sepsis, unspecified organism: Secondary | ICD-10-CM | POA: Diagnosis not present

## 2020-07-16 DIAGNOSIS — J9601 Acute respiratory failure with hypoxia: Secondary | ICD-10-CM | POA: Diagnosis not present

## 2020-07-16 DIAGNOSIS — B953 Streptococcus pneumoniae as the cause of diseases classified elsewhere: Secondary | ICD-10-CM

## 2020-07-16 DIAGNOSIS — R6520 Severe sepsis without septic shock: Secondary | ICD-10-CM

## 2020-07-16 LAB — CULTURE, BLOOD (ROUTINE X 2): Culture: NO GROWTH

## 2020-07-16 LAB — CBC
HCT: 31.3 % — ABNORMAL LOW (ref 36.0–46.0)
Hemoglobin: 9.5 g/dL — ABNORMAL LOW (ref 12.0–15.0)
MCH: 30.3 pg (ref 26.0–34.0)
MCHC: 30.4 g/dL (ref 30.0–36.0)
MCV: 99.7 fL (ref 80.0–100.0)
Platelets: 202 10*3/uL (ref 150–400)
RBC: 3.14 MIL/uL — ABNORMAL LOW (ref 3.87–5.11)
RDW: 13.1 % (ref 11.5–15.5)
WBC: 18.4 10*3/uL — ABNORMAL HIGH (ref 4.0–10.5)
nRBC: 0.1 % (ref 0.0–0.2)

## 2020-07-16 LAB — LACTIC ACID, PLASMA: Lactic Acid, Venous: 1 mmol/L (ref 0.5–1.9)

## 2020-07-16 LAB — COMPREHENSIVE METABOLIC PANEL
ALT: 16 U/L (ref 0–44)
AST: 24 U/L (ref 15–41)
Albumin: 1.5 g/dL — ABNORMAL LOW (ref 3.5–5.0)
Alkaline Phosphatase: 117 U/L (ref 38–126)
Anion gap: 11 (ref 5–15)
BUN: 66 mg/dL — ABNORMAL HIGH (ref 8–23)
CO2: 28 mmol/L (ref 22–32)
Calcium: 9.8 mg/dL (ref 8.9–10.3)
Chloride: 101 mmol/L (ref 98–111)
Creatinine, Ser: 2.32 mg/dL — ABNORMAL HIGH (ref 0.44–1.00)
GFR, Estimated: 23 mL/min — ABNORMAL LOW (ref >60)
Glucose, Bld: 173 mg/dL — ABNORMAL HIGH (ref 70–99)
Potassium: 3.9 mmol/L (ref 3.5–5.1)
Sodium: 140 mmol/L (ref 135–145)
Total Bilirubin: 0.3 mg/dL (ref 0.3–1.2)
Total Protein: 4.9 g/dL — ABNORMAL LOW (ref 6.5–8.1)

## 2020-07-16 LAB — GLUCOSE, CAPILLARY
Glucose-Capillary: 152 mg/dL — ABNORMAL HIGH (ref 70–99)
Glucose-Capillary: 120 mg/dL — ABNORMAL HIGH (ref 70–99)
Glucose-Capillary: 167 mg/dL — ABNORMAL HIGH (ref 70–99)
Glucose-Capillary: 194 mg/dL — ABNORMAL HIGH (ref 70–99)
Glucose-Capillary: 224 mg/dL — ABNORMAL HIGH (ref 70–99)

## 2020-07-16 MED ORDER — FUROSEMIDE 10 MG/ML IJ SOLN
40.0000 mg | Freq: Once | INTRAMUSCULAR | Status: AC
  Administered 2020-07-16: 40 mg via INTRAVENOUS
  Filled 2020-07-16: qty 4

## 2020-07-16 MED ORDER — IPRATROPIUM-ALBUTEROL 0.5-2.5 (3) MG/3ML IN SOLN
3.0000 mL | Freq: Three times a day (TID) | RESPIRATORY_TRACT | Status: DC
  Administered 2020-07-16 – 2020-07-18 (×7): 3 mL via RESPIRATORY_TRACT
  Filled 2020-07-16 (×6): qty 3

## 2020-07-16 NOTE — Progress Notes (Addendum)
NAME:  Annette Gibson. Volkert, MRN:  [id number], DOB:  1955/08/22, LOS: 5 ADMISSION DATE:  07/11/2020, CONSULTATION DATE:  2/8 REFERRING MD:  Dr Colvin Caroli, CHIEF COMPLAINT:  Sob and generally fatigued  Brief History    65 yo femal with pmh of copd, diastolic hf, ckd4, dm2, anxiety presents with sob and lethargy. Pt provides own history without great issue. Pt states that last Monday she went to [facility] and became sick shortly after with diarrhea. Since then she has not had good intake but reports she has been cont to take all her medications. She states that she failed to improve and for the past 3 days she has remained in bed and not felt as though she could get up. She endorses continued diarrhea, no n/v no recent sick contacts. She states she has only been able to take in ginger ale. Today she became worsening sob, she does state that she may have been before today but hadn't really moved. She suffers from copd and chronic cough that is non productive and this has not changed.   She did present to [facility] yesterday with BS in 700's and was discharged from ed but failed to improve.  Past Medical History   Past Medical History:  Diagnosis Date  . Anal warts    anal warts  . Anxiety   . CKD (chronic kidney disease)   . Congestive heart failure (CHF) ([place])   . COPD (chronic obstructive pulmonary disease) ([place])   . Diabetes mellitus without complication ([place])   . Hypertriglyceridemia   . Obesity    metabolic syndrome   . Osteopenia   . Vitamin D deficiency     Significant Hospital Events   2/8 admitted  2/11 - Wore BiPAP overnight Transitioned to sliding scale overnight, anion gap closed I/O +7.4 L total, urine output 2300 cc last 24 hours (improved)  2/12 -  07/15/2020: Afebrile/low-grade fever.  White count rising to 19.4 on FiO2 90% heated high flow nasal cannula 40 L/min.  Pulse ox 95%.  She does not feel any better since admission.  She does not like the BiPAP because it is hurting her nose  and face.  She has reluctantly used it overnight.  Consults:    Procedures:    Significant Diagnostic Tests:  Echocardiogram 2/9 >> LVEF 01-60%, normal diastolic function, moderately reduced RV function with moderate RV enlargement, mildly elevated PASP  Micro Data:  2/8 strep urine ag >> positive 2/8 leg urine ag 2/8 blood: >> Pneumococcus >>  2/8 urine:  2/8 GI panel: 2/8 cdiff:  Antimicrobials:  2/8 ceftriaxone >>  2/8 azithro >> 2/9  Interim history/subjective:    2/13 -80% FiO2 on 30 L heated high flow nasal cannula.  Pulse ox 95%.  Bedside nurse feels she is improved significantly.  White count is improved to 18.4.  Afebrile.  She is able to sit on the chair.  She has been cooperative with BiPAP at night but not using it in the daytime. Not on pressors. AKI better.    Objective   Blood pressure (!) 154/69, pulse 90, temperature 99 F (37.2 C), temperature source Oral, resp. rate (!) 32, height _0  (1.473 m), weight 102.1 kg, SpO2 96 %.    FiO2 (%):  [80 %] 80 %   Intake/Output Summary (Last 24 hours) at 07/16/2020 1414 Last data filed at 07/16/2020 1156 Gross per 24 hour  Intake 440.07 ml  Output 3500 ml  Net -3059.93 ml   [employer]  07/14/20 0500 07/15/20 0500 07/16/20 0336  Weight: 104.8 kg 105.3 kg 102.1 kg    Examination:  Morbidly obese female on heated high flow nasal oxygen alert and oriented x3.  Clear to auscultation but distant breath sounds abdomen soft but obese.  Normal heart sounds.  Extremities with some edema but mostly obesity.  .  Moves all 4 extremities. Sitting in chair. Looking beter.   Resolved Hospital Problem list    Shock, hypovolemic and septic in the setting of diarrhea, poor p.o. intake, pneumococcal pneumonia and sepsis.   Responded to IV fluid resuscitation  Diarrhea from pneumococcal pneumonia: Resolved 07/14/2020  Assessment & Plan:   Acute on chronic hypoxic resp failure due to pneumococcal pneumonia with  bacteremia   07/16/2020:  Improved to 30 L heated high flow nasal cannula 80% FiO2.  Morbid obesity barrier for rapid improvement.  Reluctantly only wearing BiPAP at night but she does it  Plan -BiPAP nightly (thought she is reluctant) -Daytime heated high flow nasal cannula -Goal pulse ox greater than 92%. -Incentive spirometry -Out of bed to the chair  sevee Sepsis due to Pneumococcal CAP with bacteremia.   -07/16/2020: Afebrile and white count going down.  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 Baseline creatinine from November roughly 2.5.    07/16/2020: Creatinine improved further   Plan Following BMP, urine output Ensure adequate renal perfusion, renal dose medications, avoid nephrotoxins   Volume overload  07/16/2020 - improved after lasix from 6.1L to 3.7L   Plan  - lasix x 1 repeat 07/16/20  Electrolyte imbalance  07/16/2020 - Mag 1.6. Low mag and repleted  Plan  - monitor   History of HTN:  Home antihypertensive regimen on hold  Elevated BNP and Trop, stress non-STEMI:   Plan Telemetry  DM2 with hyperglycemia and DKA:  Transitioned off insulin drip overnight 2/10-2/11  plan Continue sliding scale, Levemir as ordered KVO IV fluids Transition back to glipizide to Metformin once stabilized from acute events   Normocytic anemia and anemia of critical illness/chronic disease  2/32/2022: No bleeding  Plan  - - PRBC for hgb </= 6.9gm%    - exceptions are   -  if ACS susepcted/confirmed then transfuse for hgb </= 8.0gm%,  or    -  active bleeding with hemodynamic instability, then transfuse regardless of hemoglobin value   At at  all times try to transfuse 1 unit prbc as possible with exception of active hemorrhage    Best practice:  Diet: diet Pain/Anxiety/Delirium protocol (if indicated): n/a VAP protocol (if indicated): n/a DVT prophylaxis: heparin GI prophylaxis: n/a Glucose control: SSI Mobility: bedrest Code Status: full Family Communication: Patient updated bedside   -  patient 2/122/22 and then called daughte Dena [phone] -> went to VM/LMTCB - 2/13 - patient  Disposition: ICU ((progressive today/tomorrow once some more better)      ATTESTATION & SIGNATURE   The patient Mary-Ann L. Beal is critically ill with multiple organ systems failure and requires high complexity decision making for assessment and support, frequent evaluation and titration of therapies, application of advanced monitoring technologies and extensive interpretation of multiple databases.   Critical Care Time devoted to patient care services described in  this note is  31  Minutes. This time reflects time of care of this signee Dr Brand Males. This critical care time does not reflect procedure time, or teaching time or supervisory time of PA/NP/Med student/Med Resident etc but could involve care discussion time     Dr. Brand Males, M.D., [facility].C.P Pulmonary and Critical Care Medicine Staff Physician [place] [facility] Pager: [phone], If no answer or between  15:00h - 7:00h: call [phone]  07/16/2020 2:14 PM     LABS    PULMONARY Recent Labs  Lab 07/11/20 1751  HCO3 27.0  TCO2 29  O2SAT 85.0    CBC Recent Labs  Lab 07/14/20 0402 07/15/20 0237 07/16/20 0340  HGB 10.2* 10.6* 9.5*  HCT 32.7* 33.0* 31.3*  WBC 16.9* 19.4* 18.4*  PLT 239 200 202    COAGULATION Recent Labs  Lab 07/11/20 1738  INR 1.3*    CARDIAC  No results for input(s): TROPONINI in the last 168 hours. No results for input(s): PROBNP in the last 168 hours.   CHEMISTRY Recent  Labs  Lab 07/12/20 0625 07/12/20 1105 07/13/20 2253 07/14/20 0402 07/14/20 2206 07/15/20 0237 07/16/20 0340  NA 136   < > 142 141 142 141 140  K 5.5*   < > 3.8 3.7 4.1 3.8 3.9  CL 98   < > 103 103 106 104 101  CO2 23   < > _0 GLUCOSE 455*   < > 150* 163* 124* 102* 173*  BUN 80*   < > 75* 72* 70* 68* 66*  CREATININE 4.19*   < > 3.09* 3.00* 2.73* 2.65* 2.32*  CALCIUM 8.6*   < > 9.3 9.4 9.5 9.6 9.8  MG 2.0  --   --  1.7  --  1.6*  --   PHOS 5.0*  --   --  3.5  --  4.2  --    < > = values in this interval not displayed.   Estimated Creatinine Clearance: 25.3 mL/min (A) (by C-G formula based on SCr of 2.32 mg/dL (H)).   LIVER Recent Labs  Lab 07/11/20 1738 07/13/20 0920 07/16/20 0340  AST 36  --  24  ALT 23  --  16  ALKPHOS 61  --  117  BILITOT 1.6*  --  0.3  PROT 6.1*  --  4.9*  ALBUMIN 2.1* 1.5* 1.5*  INR 1.3*  --   --      INFECTIOUS Recent Labs  Lab 07/11/20 2114 07/12/20 0121 07/12/20 0551 07/16/20 0340  LATICACIDVEN  --  2.8* 2.3* 1.0  PROCALCITON 37.25  --   --   --      ENDOCRINE CBG (last 3)  Recent Labs    07/16/20 0332 07/16/20 0744 07/16/20 1107  GLUCAP 152* 120* 167*         IMAGING x48h  - image(s) personally visualized  -   highlighted in bold DG Chest Port 1 View  Result Date: 07/15/2020 CLINICAL DATA:  Pneumonia. EXAM: PORTABLE CHEST 1 VIEW COMPARISON:  Chest x-ray dated July 13, 2020. FINDINGS: The heart size and mediastinal contours are within normal limits. Normal pulmonary vascularity. Diffuse opacity throughout the right lung, worsened in the interval and now involving the right apex. Minimal left basilar atelectasis. The left lung is otherwise clear. Unchanged small right pleural effusion. No pneumothorax. No acute osseous abnormality. IMPRESSION: 1. Worsening multifocal pneumonia in the right  lung. 2. Unchanged small right pleural effusion. Electronically Signed   By: Titus Dubin M.D.   On: 07/15/2020 07:27

## 2020-07-16 NOTE — Evaluation (Signed)
Physical Therapy Evaluation Patient Details Name: Annette Gibson. Neace MRN: [id number] DOB: 1955-11-26 Today's Date: 07/16/2020   History of Present Illness  65 yo admitted 2/8 with SOB and lethargy reporting poor PO intake after getting sick from [facility] Pt with CAP requiring Bipap. PMhx: COPD, CHF, CKD, DM, anxiety  Clinical Impression  Pt in chair on arrival with [place] at 80% and 30L. At rest pt 95% with drop to 85% with limited gait and 82% with standing pericare and pivot to chair. Pt requires 2 min seated rest and cues for posture, chest expansion and breathing technique to recover between transfers. Pt with decreased transfers, functional mobility and cardiopulmonary function who will benefit from acute therapy to maximize mobility and function to decrease burden of care. Pt educated for IS use and performed x 5 only able to achieve 500cc.      Follow Up Recommendations Home health PT    Equipment Recommendations  3in1 (PT)    Recommendations for Other Services OT consult     Precautions / Restrictions Precautions Precautions: Fall Precaution Comments: watch sats, HHFNC Restrictions Weight Bearing Restrictions: No      Mobility  Bed Mobility               General bed mobility comments: in chair on arrival    Transfers Overall transfer level: Needs assistance   Transfers: Sit to/from Stand;Stand Pivot Transfers Sit to Stand: Min guard Stand pivot transfers: Min guard       General transfer comment: cues for hand placement, safety and sequence with assist to manage lines. pt able to stand from recliner and BSC pivot BSC<>recliner with RW for UE support. Desaturation to 82% on return to recliner with 2 min seated recovery to 90%  Ambulation/Gait Ambulation/Gait assistance: Min guard Gait Distance (Feet): 6 Feet Assistive device: Rolling walker (2 wheeled) Gait Pattern/deviations: Step-to pattern;Decreased stride length;Trunk flexed   Gait velocity  interpretation: <1.8 ft/sec, indicate of risk for recurrent falls General Gait Details: pt able to walk 3' forward and back at chair with RW limited by HHfNC tubing and desaturation to 85% with stepping  Stairs            Wheelchair Mobility    Modified Rankin (Stroke Patients Only)       Balance Overall balance assessment: Needs assistance   Sitting balance-Leahy Scale: Good     Standing balance support: Bilateral upper extremity supported Standing balance-Leahy Scale: Poor Standing balance comment: reliant on single and bil UE support in standing                             Pertinent Vitals/Pain Pain Assessment: No/denies pain    Home Living Family/patient expects to be discharged to:: Private residence Living Arrangements: Children Available Help at Discharge: Family;Available 24 hours/day Type of Home: Mobile home Home Access: Stairs to enter Entrance Stairs-Rails: Right;Left;Can reach both Entrance Stairs-Number of Steps: 4 Home Layout: One level Home Equipment: Toilet riser;Walker - 2 wheels;Cane - single point;Shower seat - built in      Prior Function Level of Independence: Independent         Comments: normally independent, doesn't work has been using RW a few days PTA due to feeling weak, mostly sedentary     Hand Dominance        Extremity/Trunk Assessment   Upper Extremity Assessment Upper Extremity Assessment: Overall WFL for tasks assessed    Lower Extremity Assessment Lower Extremity  Assessment: Generalized weakness    Cervical / Trunk Assessment Cervical / Trunk Assessment: Other exceptions Cervical / Trunk Exceptions: rounded shoulders  Communication   Communication: No difficulties  Cognition Arousal/Alertness: Awake/alert Behavior During Therapy: WFL for tasks assessed/performed Overall Cognitive Status: Within Functional Limits for tasks assessed                                        General  Comments      Exercises     Assessment/Plan    PT Assessment Patient needs continued PT services  PT Problem List Decreased mobility;Decreased activity tolerance;Cardiopulmonary status limiting activity;Decreased balance;Decreased knowledge of use of DME       PT Treatment Interventions Gait training;Balance training;Stair training;Functional mobility training;Therapeutic activities;Patient/family education;DME instruction;Therapeutic exercise    PT Goals (Current goals can be found in the Care Plan section)  Acute Rehab PT Goals Patient Stated Goal: return home PT Goal Formulation: With patient Time For Goal Achievement: 07/30/20 Potential to Achieve Goals: Fair    Frequency Min 3X/week   Barriers to discharge        Co-evaluation               AM-PAC PT "6 Clicks" Mobility  Outcome Measure Help needed turning from your back to your side while in a flat bed without using bedrails?: A Little Help needed moving from lying on your back to sitting on the side of a flat bed without using bedrails?: A Little Help needed moving to and from a bed to a chair (including a wheelchair)?: A Little Help needed standing up from a chair using your arms (e.g., wheelchair or bedside chair)?: A Little Help needed to walk in hospital room?: A Lot Help needed climbing 3-5 steps with a railing? : Total 6 Click Score: 15    End of Session Equipment Utilized During Treatment: Oxygen Activity Tolerance: Patient tolerated treatment well Patient left: in chair;with call bell/phone within reach Nurse Communication: Mobility status PT Visit Diagnosis: Other abnormalities of gait and mobility (R26.89);Difficulty in walking, not elsewhere classified (R26.2)    Time: 4650-3546 PT Time Calculation (min) (ACUTE ONLY): 27 min   Charges:   PT Evaluation $PT Eval Moderate Complexity: 1 Mod PT Treatments $Therapeutic Activity: 8-22 mins        Ajane Novella P, PT Acute Rehabilitation  Services Pager: [phone] Office: [phone]   Dequavion Follette B Aliene Tamura 07/16/2020, 11:49 AM

## 2020-07-17 ENCOUNTER — Inpatient Hospital Stay (HOSPITAL_COMMUNITY): Payer: Medicare HMO

## 2020-07-17 DIAGNOSIS — J9601 Acute respiratory failure with hypoxia: Secondary | ICD-10-CM | POA: Diagnosis not present

## 2020-07-17 DIAGNOSIS — N179 Acute kidney failure, unspecified: Secondary | ICD-10-CM | POA: Diagnosis not present

## 2020-07-17 DIAGNOSIS — E1110 Type 2 diabetes mellitus with ketoacidosis without coma: Secondary | ICD-10-CM | POA: Diagnosis not present

## 2020-07-17 DIAGNOSIS — J13 Pneumonia due to Streptococcus pneumoniae: Secondary | ICD-10-CM | POA: Diagnosis not present

## 2020-07-17 LAB — COMPREHENSIVE METABOLIC PANEL
ALT: 15 U/L (ref 0–44)
AST: 21 U/L (ref 15–41)
Albumin: 1.5 g/dL — ABNORMAL LOW (ref 3.5–5.0)
Alkaline Phosphatase: 121 U/L (ref 38–126)
Anion gap: 15 (ref 5–15)
BUN: 58 mg/dL — ABNORMAL HIGH (ref 8–23)
CO2: 25 mmol/L (ref 22–32)
Calcium: 10 mg/dL (ref 8.9–10.3)
Chloride: 99 mmol/L (ref 98–111)
Creatinine, Ser: 2.01 mg/dL — ABNORMAL HIGH (ref 0.44–1.00)
GFR, Estimated: 27 mL/min — ABNORMAL LOW (ref >60)
Glucose, Bld: 183 mg/dL — ABNORMAL HIGH (ref 70–99)
Potassium: 3.1 mmol/L — ABNORMAL LOW (ref 3.5–5.1)
Sodium: 139 mmol/L (ref 135–145)
Total Bilirubin: 0.3 mg/dL (ref 0.3–1.2)
Total Protein: 5.6 g/dL — ABNORMAL LOW (ref 6.5–8.1)

## 2020-07-17 LAB — CBC
HCT: 30.4 % — ABNORMAL LOW (ref 36.0–46.0)
Hemoglobin: 9.6 g/dL — ABNORMAL LOW (ref 12.0–15.0)
MCH: 30.8 pg (ref 26.0–34.0)
MCHC: 31.6 g/dL (ref 30.0–36.0)
MCV: 97.4 fL (ref 80.0–100.0)
Platelets: 228 10*3/uL (ref 150–400)
RBC: 3.12 MIL/uL — ABNORMAL LOW (ref 3.87–5.11)
RDW: 13.1 % (ref 11.5–15.5)
WBC: 15.5 10*3/uL — ABNORMAL HIGH (ref 4.0–10.5)
nRBC: 0 % (ref 0.0–0.2)

## 2020-07-17 LAB — MAGNESIUM: Magnesium: 1.7 mg/dL (ref 1.7–2.4)

## 2020-07-17 LAB — PHOSPHORUS: Phosphorus: 4.7 mg/dL — ABNORMAL HIGH (ref 2.5–4.6)

## 2020-07-17 LAB — GLUCOSE, CAPILLARY
Glucose-Capillary: 191 mg/dL — ABNORMAL HIGH (ref 70–99)
Glucose-Capillary: 192 mg/dL — ABNORMAL HIGH (ref 70–99)
Glucose-Capillary: 226 mg/dL — ABNORMAL HIGH (ref 70–99)
Glucose-Capillary: 239 mg/dL — ABNORMAL HIGH (ref 70–99)
Glucose-Capillary: 99 mg/dL (ref 70–99)

## 2020-07-17 MED ORDER — INSULIN ASPART 100 UNIT/ML ~~LOC~~ SOLN
0.0000 [IU] | Freq: Every day | SUBCUTANEOUS | Status: DC
  Administered 2020-07-17 – 2020-07-29 (×3): 2 [IU] via SUBCUTANEOUS

## 2020-07-17 MED ORDER — POTASSIUM CHLORIDE 20 MEQ PO PACK: 20.0000 meq | PACK | ORAL | Status: DC

## 2020-07-17 MED ORDER — INSULIN ASPART 100 UNIT/ML ~~LOC~~ SOLN
0.0000 [IU] | Freq: Three times a day (TID) | SUBCUTANEOUS | Status: DC
  Administered 2020-07-17 (×2): 3 [IU] via SUBCUTANEOUS
  Administered 2020-07-18 (×2): 2 [IU] via SUBCUTANEOUS
  Administered 2020-07-18: 1 [IU] via SUBCUTANEOUS
  Administered 2020-07-19 – 2020-07-20 (×3): 3 [IU] via SUBCUTANEOUS
  Administered 2020-07-20: 2 [IU] via SUBCUTANEOUS
  Administered 2020-07-21: 1 [IU] via SUBCUTANEOUS
  Administered 2020-07-21 – 2020-07-23 (×3): 2 [IU] via SUBCUTANEOUS
  Administered 2020-07-23 (×2): 1 [IU] via SUBCUTANEOUS
  Administered 2020-07-24: 3 [IU] via SUBCUTANEOUS
  Administered 2020-07-24: 2 [IU] via SUBCUTANEOUS
  Administered 2020-07-25: 3 [IU] via SUBCUTANEOUS
  Administered 2020-07-25: 1 [IU] via SUBCUTANEOUS
  Administered 2020-07-25 – 2020-07-26 (×3): 2 [IU] via SUBCUTANEOUS
  Administered 2020-07-27: 7 [IU] via SUBCUTANEOUS
  Administered 2020-07-27: 1 [IU] via SUBCUTANEOUS
  Administered 2020-07-28: 2 [IU] via SUBCUTANEOUS
  Administered 2020-07-28: 1 [IU] via SUBCUTANEOUS
  Administered 2020-07-28 – 2020-07-29 (×2): 3 [IU] via SUBCUTANEOUS
  Administered 2020-07-29: 1 [IU] via SUBCUTANEOUS
  Administered 2020-07-29 – 2020-07-30 (×2): 2 [IU] via SUBCUTANEOUS
  Administered 2020-07-30: 3 [IU] via SUBCUTANEOUS
  Administered 2020-07-31: 2 [IU] via SUBCUTANEOUS

## 2020-07-17 MED ORDER — INSULIN DETEMIR 100 UNIT/ML ~~LOC~~ SOLN
10.0000 [IU] | Freq: Two times a day (BID) | SUBCUTANEOUS | Status: DC
  Administered 2020-07-17 (×2): 10 [IU] via SUBCUTANEOUS
  Filled 2020-07-17 (×4): qty 0.1

## 2020-07-17 MED ORDER — FUROSEMIDE 10 MG/ML IJ SOLN
40.0000 mg | Freq: Once | INTRAMUSCULAR | Status: AC
  Administered 2020-07-17: 40 mg via INTRAVENOUS
  Filled 2020-07-17: qty 4

## 2020-07-17 MED ORDER — MAGNESIUM SULFATE 2 GM/50ML IV SOLN
2.0000 g | Freq: Once | INTRAVENOUS | Status: AC
  Administered 2020-07-17: 2 g via INTRAVENOUS
  Filled 2020-07-17: qty 50

## 2020-07-17 MED ORDER — POTASSIUM CHLORIDE 20 MEQ PO PACK
20.0000 meq | PACK | ORAL | Status: AC
  Administered 2020-07-17 (×3): 20 meq via ORAL
  Filled 2020-07-17 (×3): qty 1

## 2020-07-17 NOTE — Progress Notes (Signed)
NAME:  Annette Gibson. Koehn, MRN:  [id number], DOB:  [birth year], LOS: 6 ADMISSION DATE:  07/11/2020, CONSULTATION DATE:  2/8 REFERRING MD:  Dr Colvin Caroli, CHIEF COMPLAINT:  Sob and generally fatigued  Brief History    65 yo femal with pmh of copd, diastolic hf, ckd4, dm2, anxiety presents with sob and lethargy. Pt provides own history without great issue. Pt states that last Monday she went to [facility] and became sick shortly after with diarrhea. Since then she has not had good intake but reports she has been cont to take all her medications. She states that she failed to improve and for the past 3 days she has remained in bed and not felt as though she could get up. She endorses continued diarrhea, no n/v no recent sick contacts. She states she has only been able to take in ginger ale. Today she became worsening sob, she does state that she may have been before today but hadn't really moved. She suffers from copd and chronic cough that is non productive and this has not changed.   She did present to [facility] yesterday with BS in 700's and was discharged from ed but failed to improve.  Past Medical History   Past Medical History:  Diagnosis Date  . Anal warts    anal warts  . Anxiety   . CKD (chronic kidney disease)   . Congestive heart failure (CHF) ([place])   . COPD (chronic obstructive pulmonary disease) ([place])   . Diabetes mellitus without complication ([place])   . Hypertriglyceridemia   . Obesity    metabolic syndrome   . Osteopenia   . Vitamin D deficiency     Significant Hospital Events   2/8 admitted  2/11 - Wore BiPAP overnight Transitioned to sliding scale overnight, anion gap closed I/O +7.4 L total, urine output 2300 cc last 24 hours (improved)  2/12 -  07/15/2020: Afebrile/low-grade fever.  White count rising to 19.4 on FiO2 90% heated high flow nasal cannula 40 L/min.  Pulse ox 95%.  She does not feel any better since admission.  She does not like the BiPAP because it is hurting her nose  and face.  She has reluctantly used it overnight.  Consults:    Procedures:    Significant Diagnostic Tests:  Echocardiogram 2/9 >> LVEF 76-54%, normal diastolic function, moderately reduced RV function with moderate RV enlargement, mildly elevated PASP  Micro Data:  2/8 strep urine ag >> positive 2/8 blood: >> Pneumococcus >>   Antimicrobials:  2/8 ceftriaxone >>  2/8 azithro >> 2/9  Interim history/subjective:  Patient continued to require heated flow nasal cannula oxygen, O2 sat remain above 90%.  Refused using BiPAP overnight.  White count is trending down, remained afebrile AKI is improving  Objective   Blood pressure 121/60, pulse 78, temperature 98.1 F (36.7 C), temperature source Oral, resp. rate (!) 22, height _0  (1.473 m), weight 102.7 kg, SpO2 90 %.    FiO2 (%):  [70 %-80 %] 80 %   Intake/Output Summary (Last 24 hours) at 07/17/2020 1119 Last data filed at 07/16/2020 2200 Gross per 24 hour  Intake 440 ml  Output 1626 ml  Net -1186 ml   [employer]   07/15/20 0500 07/16/20 0336 07/17/20 0446  Weight: 105.3 kg 102.1 kg 102.7 kg    Examination:    Physical exam: General: Chronically ill-appearing morbidly obese female on heated high flow HEENT: [place]/AT, eyes anicteric.    Dry mucous membranes Neuro: Awake, alert, following  commands, moving all 4 extremities antigravity Chest: Coarse breath sounds, no wheezes or rhonchi Heart: Regular rate and rhythm, no murmurs or gallops Abdomen: Soft, nontender, nondistended, bowel sounds present Skin: No rash  Resolved Hospital Problem list    Shock, hypovolemic and septic in the setting of diarrhea, poor p.o. intake, pneumococcal pneumonia and sepsis.   Responded to IV fluid resuscitation  Diarrhea from pneumococcal pneumonia: Resolved 07/14/2020  Assessment & Plan:   Acute on chronic hypoxic resp failure due to pneumococcal pneumonia with bacteremia Sepsis due to pneumococcal pneumonia and  bacteremia Patient continued require heated high flow oxygen, titrate FiO2 to maintain O2 sat 88-92% Currently on 25 L and 80% Patient has been refusing BiPAP at night, she will need sleep studies as an outpatient to rule out obstructive sleep apnea She should use BiPAP to help open up lung spaces Encourage incentive spirometry Continue IV ceftriaxone to complete 2 weeks antibiotic therapy  COPD: without acute exacerbation Continue nebs  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  Serum creatinine is improving Closely monitor BMP, urine output and electrolytes  Electrolyte imbalance Hypokalemia, hypomagnesemia Electrolytes were supplemented, continue to monitor  DM2 with hyperglycemia and DKA, improved Continue sliding scale, Levemir as ordered Transition back to glipizide to Metformin once stabilized from acute events   Normocytic anemia and anemia of critical illness/chronic disease No bleeding Monitor H&H with a goal hemoglobin 7-8   Best practice:  Diet: Regular Pain/Anxiety/Delirium protocol (if indicated): n/a VAP protocol (if indicated): n/a DVT prophylaxis: heparin GI prophylaxis: n/a Glucose control: SSI Mobility: bedrest Code Status: full Disposition: PCU  LABS    PULMONARY Recent Labs  Lab 07/11/20 1751  HCO3 27.0  TCO2 29  O2SAT 85.0    CBC Recent Labs  Lab 07/15/20 0237 07/16/20 0340 07/16/20 2347  HGB 10.6* 9.5* 9.6*  HCT 33.0* 31.3* 30.4*  WBC 19.4* 18.4* 15.5*  PLT 200 202 228    COAGULATION Recent Labs  Lab 07/11/20 1738  INR 1.3*    CARDIAC  No results for input(s): TROPONINI in the last 168 hours. No results for input(s): PROBNP in the last 168 hours.   CHEMISTRY Recent Labs  Lab 07/12/20 0625  07/12/20 1105 07/14/20 0402 07/14/20 2206 07/15/20 0237 07/16/20 0340 07/17/20 0201  NA 136   < > 141 142 141 140 139  K 5.5*   < > 3.7 4.1 3.8 3.9 3.1*  CL 98   < > 103 106 104 101 99  CO2 23   < > _0 GLUCOSE 455*   < > 163* 124* 102* 173* 183*  BUN 80*   < > 72* 70* 68* 66* 58*  CREATININE 4.19*   < > 3.00* 2.73* 2.65* 2.32* 2.01*  CALCIUM 8.6*   < > 9.4 9.5 9.6 9.8 10.0  MG 2.0  --  1.7  --  1.6*  --  1.7  PHOS 5.0*  --  3.5  --  4.2  --  4.7*   < > = values in this interval not displayed.   Estimated Creatinine Clearance: 29.3 mL/min (A) (by C-G formula based on SCr of 2.01 mg/dL (H)).   LIVER Recent Labs  Lab 07/11/20 1738 07/13/20 0920 07/16/20 0340 07/17/20 0201  AST 36  --  24  21  ALT 23  --  16 15  ALKPHOS 61  --  117 121  BILITOT 1.6*  --  0.3 0.3  PROT 6.1*  --  4.9* 5.6*  ALBUMIN 2.1* 1.5* 1.5* 1.5*  INR 1.3*  --   --   --      INFECTIOUS Recent Labs  Lab 07/11/20 2114 07/12/20 0121 07/12/20 0551 07/16/20 0340  LATICACIDVEN  --  2.8* 2.3* 1.0  PROCALCITON 37.25  --   --   --      ENDOCRINE CBG (last 3)  Recent Labs    07/16/20 2006 07/17/20 0008 07/17/20 0717  GLUCAP 14* 33* 47     Jacky Kindle MD [place] [facility] See Amion for pager If no response to pager, please call [phone] until 7pm After 7pm, Please call E-link [phone]

## 2020-07-17 NOTE — Progress Notes (Signed)
Physical Therapy Treatment Patient Details Name: Annette Gibson MRN: [id number] DOB: [birth year] Today's Date: 07/17/2020    History of Present Illness 65 yo admitted 2/8 with SOB and lethargy reporting poor PO intake after getting sick from [facility]. Pt with CAP requiring Bipap. PMhx: COPD, CHF, CKD, DM, anxiety    PT Comments    Pt alert and very pleasant. Pt able to transfers to [facility], chair and walk limited distance due to desaturation. Pt with desaturation to 80% with gait and 3 min recovery to 89% and 90% at rest end of session. Pt educated for mobility, safety , RW use, breathing technique and chest expansion to improve lung function. Will continue to follow.     Follow Up Recommendations  Home health PT     Equipment Recommendations  3in1 (PT)    Recommendations for Other Services       Precautions / Restrictions Precautions Precautions: Fall Precaution Comments: watch sats, HHFNC    Mobility  Bed Mobility Overal bed mobility: Needs Assistance Bed Mobility: Supine to Sit     Supine to sit: Min guard     General bed mobility comments: guarding with assist of rail and HOB 25 degrees    Transfers Overall transfer level: Needs assistance     Sit to Stand: Min guard Stand pivot transfers: Min guard       General transfer comment: guarding for lines with pt able to stand and pivot from bed>BSC>recliner. pt then able to stand again from recliner, cues for hand placement  Ambulation/Gait Ambulation/Gait assistance: Min guard Gait Distance (Feet): 12 Feet Assistive device: Rolling walker (2 wheeled) Gait Pattern/deviations: Step-to pattern;Decreased stride length;Trunk flexed   Gait velocity interpretation: <1.8 ft/sec, indicate of risk for recurrent falls General Gait Details: pt able to walk 3' forward and back at chair with RW limited by HHfNC tubing. Pt with sats 86% for 9' then desat to 80% with last 3' total of 12' and seated rest 3 min to recover to  89%   Stairs             Wheelchair Mobility    Modified Rankin (Stroke Patients Only)       Balance Overall balance assessment: Needs assistance   Sitting balance-Leahy Scale: Good     Standing balance support: Bilateral upper extremity supported Standing balance-Leahy Scale: Poor Standing balance comment: reliant on single and bil UE support in standing                            Cognition Arousal/Alertness: Awake/alert Behavior During Therapy: WFL for tasks assessed/performed Overall Cognitive Status: Within Functional Limits for tasks assessed                                        Exercises General Exercises - Lower Extremity Long Arc Quad: AROM;Both;Seated;15 reps Hip Flexion/Marching: AROM;Both;10 reps;Seated    General Comments        Pertinent Vitals/Pain Pain Assessment: No/denies pain    Home Living                      Prior Function            PT Goals (current goals can now be found in the care plan section) Progress towards PT goals: Progressing toward goals    Frequency    Min 3X/week  PT Plan Current plan remains appropriate    Co-evaluation              AM-PAC PT "6 Clicks" Mobility   Outcome Measure  Help needed turning from your back to your side while in a flat bed without using bedrails?: A Little Help needed moving from lying on your back to sitting on the side of a flat bed without using bedrails?: A Little Help needed moving to and from a bed to a chair (including a wheelchair)?: A Little Help needed standing up from a chair using your arms (e.g., wheelchair or bedside chair)?: A Little Help needed to walk in hospital room?: A Little Help needed climbing 3-5 steps with a railing? : A Lot 6 Click Score: 17    End of Session Equipment Utilized During Treatment: Oxygen Activity Tolerance: Patient tolerated treatment well Patient left: in chair;with call bell/phone  within reach Nurse Communication: Mobility status PT Visit Diagnosis: Other abnormalities of gait and mobility (R26.89);Difficulty in walking, not elsewhere classified (R26.2)     Time: 2482-5003 PT Time Calculation (min) (ACUTE ONLY): 34 min  Charges:  $Therapeutic Exercise: 8-22 mins $Therapeutic Activity: 8-22 mins                     Shyia Fillingim P, PT Acute Rehabilitation Services Pager: [phone] Office: [phone]    Korry Dalgleish B Ricky Doan 07/17/2020, 10:01 AM

## 2020-07-17 NOTE — Evaluation (Signed)
Occupational Therapy Evaluation Patient Details Name: Annette Gibson. Prunty MRN: [id number] DOB: [birth year] Today's Date: 07/17/2020    History of Present Illness 65 yo admitted 2/8 with SOB and lethargy reporting poor PO intake after getting sick from [facility]. Pt with CAP requiring Bipap. PMhx: COPD, CHF, CKD, DM, anxiety   Clinical Impression   Pt PTA: Pt living with family at home and mostly independent with ADL and mobility with RW occasionally. Pt currently, MinA overall for ADL and minguardA for transfers and sit to stands. Pt limited by [place] O2 to move about in room a few steps. Pt limited by decreased strength, decreased activity tolereance and decreased ability to care for self. Pt on Pt on 20 HHFNC 70% FiO2, HR 90-107 BPM and 141/63 after exertion. Pt's O2 desats to 80s with exertion and required 30 secs to 1 min to recover >89%O2. Pt would benefit from continued OT skilled services for ADL, mobility and energy conservation. OT following acutely.    Follow Up Recommendations  Home health OT    Equipment Recommendations  3 in 1 bedside commode    Recommendations for Other Services       Precautions / Restrictions Precautions Precautions: Fall Precaution Comments: watch sats, HHFNC Restrictions Weight Bearing Restrictions: No      Mobility Bed Mobility               General bed mobility comments: in recliner pre an post session    Transfers Overall transfer level: Needs assistance Equipment used: Rolling walker (2 wheeled)   Sit to Stand: Min guard Stand pivot transfers: Min guard       General transfer comment: sit to stand multiple times for transfer to recliner <-> BSC; assist for safety and lined    Balance Overall balance assessment: Needs assistance   Sitting balance-Leahy Scale: Good     Standing balance support: Bilateral upper extremity supported Standing balance-Leahy Scale: Poor Standing balance comment: reliant on single and bil UE support in  standing                           ADL either performed or assessed with clinical judgement   ADL Overall ADL's : Needs assistance/impaired Eating/Feeding: Set up;Sitting   Grooming: Set up;Sitting   Upper Body Bathing: Minimal assistance;Sitting   Lower Body Bathing: Moderate assistance;Cueing for safety;Sitting/lateral leans;Sit to/from stand   Upper Body Dressing : Minimal assistance;Sitting   Lower Body Dressing: Sit to/from stand;Moderate assistance;Sitting/lateral leans;Cueing for safety   Toilet Transfer: Min guard;Cueing for safety   Toileting- Clothing Manipulation and Hygiene: Moderate assistance;Cueing for safety;Cueing for sequencing       Functional mobility during ADLs: Min guard;Rolling walker;Cueing for safety General ADL Comments: Pt limited by decreased strength, decreased activity tolereance and decreased ability to care for self. Pt on Pt on 20 HHFNC 70% FiO2, HR 90-107 BPM and 141/63 after exertion. Pt's O2 desats to 80s with exertion and required 30 secs to 1 min to recover >89%O2.     Vision Baseline Vision/History: No visual deficits Patient Visual Report: No change from baseline Vision Assessment?: No apparent visual deficits     Perception     Praxis      Pertinent Vitals/Pain Pain Assessment: No/denies pain     Hand Dominance Right   Extremity/Trunk Assessment Upper Extremity Assessment Upper Extremity Assessment: Generalized weakness;RUE deficits/detail;LUE deficits/detail RUE Deficits / Details: edematous hands LUE Deficits / Details: edematous hands   Lower Extremity Assessment  Lower Extremity Assessment: Generalized weakness   Cervical / Trunk Assessment Cervical / Trunk Assessment: Other exceptions Cervical / Trunk Exceptions: rounded shoulders   Communication Communication Communication: No difficulties   Cognition Arousal/Alertness: Awake/alert Behavior During Therapy: WFL for tasks assessed/performed Overall  Cognitive Status: Within Functional Limits for tasks assessed                                     General Comments  Pt on 20 HHFNC 70% FiO2, HR 90-107 BPM and 141/63 after exertion. Pt's O2 desats to 80s with exertion and required 30 secs to 1 min to recover >89%O2.    Exercises     Shoulder Instructions      Home Living Family/patient expects to be discharged to:: Private residence Living Arrangements: Children Available Help at Discharge: Family;Available 24 hours/day Type of Home: Mobile home Home Access: Stairs to enter Entrance Stairs-Number of Steps: 4 Entrance Stairs-Rails: Right;Left;Can reach both Home Layout: One level     Bathroom Shower/Tub: Occupational psychologist: Standard     Home Equipment: Toilet riser;Walker - 2 wheels;Cane - single point;Shower seat - built in          Prior Functioning/Environment Level of Independence: Independent        Comments: normally independent, doesn't work has been using RW a few days PTA due to feeling weak, mostly sedentary        OT Problem List: Decreased strength;Decreased activity tolerance;Impaired balance (sitting and/or standing);Decreased safety awareness;Cardiopulmonary status limiting activity;Increased edema      OT Treatment/Interventions: Self-care/ADL training;Therapeutic exercise;Energy conservation;Patient/family education;Balance training;Therapeutic activities    OT Goals(Current goals can be found in the care plan section) Acute Rehab OT Goals Patient Stated Goal: return home OT Goal Formulation: With patient Time For Goal Achievement: 07/31/20 Potential to Achieve Goals: Good ADL Goals Pt Will Perform Grooming: with modified independence;standing Additional ADL Goal #1: Pt will perform x10 mins of OOB ADL activity with supervivisonA overall for increasing endurance. Additional ADL Goal #2: Pt will state/utilize 3 energy conservation strategies for ADL and mobility in  order to increase activity tolerance at home.  OT Frequency: Min 2X/week   Barriers to D/C:            Co-evaluation              AM-PAC OT "6 Clicks" Daily Activity     Outcome Measure Help from another person eating meals?: None Help from another person taking care of personal grooming?: A Little Help from another person toileting, which includes using toliet, bedpan, or urinal?: A Little Help from another person bathing (including washing, rinsing, drying)?: A Little Help from another person to put on and taking off regular upper body clothing?: A Little Help from another person to put on and taking off regular lower body clothing?: A Little 6 Click Score: 19   End of Session Equipment Utilized During Treatment: Gait belt;Rolling walker;Oxygen Nurse Communication: Mobility status  Activity Tolerance: Patient tolerated treatment well Patient left: in chair;with call bell/phone within reach  OT Visit Diagnosis: Unsteadiness on feet (R26.81);Muscle weakness (generalized) (M62.81)                Time: 2992-4268 OT Time Calculation (min): 18 min Charges:  OT General Charges $OT Visit: 1 Visit OT Evaluation $OT Eval Moderate Complexity: 1 Mod  Jefferey Pica, OTR/L Acute Rehabilitation Services Pager: [phone] Office: [phone]  Skylan Gift  C 07/17/2020, 2:01 PM

## 2020-07-18 ENCOUNTER — Inpatient Hospital Stay: Payer: Self-pay

## 2020-07-18 DIAGNOSIS — J189 Pneumonia, unspecified organism: Secondary | ICD-10-CM | POA: Diagnosis not present

## 2020-07-18 DIAGNOSIS — R6520 Severe sepsis without septic shock: Secondary | ICD-10-CM

## 2020-07-18 DIAGNOSIS — N179 Acute kidney failure, unspecified: Secondary | ICD-10-CM | POA: Diagnosis not present

## 2020-07-18 DIAGNOSIS — J9601 Acute respiratory failure with hypoxia: Secondary | ICD-10-CM | POA: Diagnosis not present

## 2020-07-18 DIAGNOSIS — R739 Hyperglycemia, unspecified: Secondary | ICD-10-CM

## 2020-07-18 DIAGNOSIS — A419 Sepsis, unspecified organism: Secondary | ICD-10-CM

## 2020-07-18 DIAGNOSIS — B953 Streptococcus pneumoniae as the cause of diseases classified elsewhere: Secondary | ICD-10-CM

## 2020-07-18 DIAGNOSIS — E1110 Type 2 diabetes mellitus with ketoacidosis without coma: Secondary | ICD-10-CM | POA: Diagnosis not present

## 2020-07-18 LAB — GLUCOSE, CAPILLARY
Glucose-Capillary: 165 mg/dL — ABNORMAL HIGH (ref 70–99)
Glucose-Capillary: 188 mg/dL — ABNORMAL HIGH (ref 70–99)
Glucose-Capillary: 140 mg/dL — ABNORMAL HIGH (ref 70–99)
Glucose-Capillary: 158 mg/dL — ABNORMAL HIGH (ref 70–99)

## 2020-07-18 MED ORDER — POTASSIUM CHLORIDE CRYS ER 20 MEQ PO TBCR
40.0000 meq | EXTENDED_RELEASE_TABLET | Freq: Two times a day (BID) | ORAL | Status: AC
  Administered 2020-07-18 (×2): 40 meq via ORAL
  Filled 2020-07-18 (×2): qty 2

## 2020-07-18 MED ORDER — SODIUM CHLORIDE 0.9 % IV SOLN
INTRAVENOUS | Status: DC | PRN
  Administered 2020-07-18: 250 mL via INTRAVENOUS

## 2020-07-18 MED ORDER — SODIUM CHLORIDE 0.9% FLUSH: 10.0000 mL | INTRAVENOUS | Status: DC | PRN

## 2020-07-18 MED ORDER — ALBUTEROL SULFATE HFA 108 (90 BASE) MCG/ACT IN AERS
2.0000 | INHALATION_SPRAY | Freq: Four times a day (QID) | RESPIRATORY_TRACT | Status: DC
  Filled 2020-07-18: qty 6.7

## 2020-07-18 MED ORDER — IPRATROPIUM-ALBUTEROL 0.5-2.5 (3) MG/3ML IN SOLN
3.0000 mL | RESPIRATORY_TRACT | Status: DC
  Administered 2020-07-18 – 2020-07-19 (×4): 3 mL via RESPIRATORY_TRACT
  Filled 2020-07-18 (×4): qty 3

## 2020-07-18 MED ORDER — INSULIN DETEMIR 100 UNIT/ML ~~LOC~~ SOLN
12.0000 [IU] | Freq: Two times a day (BID) | SUBCUTANEOUS | Status: DC
  Administered 2020-07-18 – 2020-07-31 (×27): 12 [IU] via SUBCUTANEOUS
  Filled 2020-07-18 (×29): qty 0.12

## 2020-07-18 MED ORDER — INSULIN ASPART 100 UNIT/ML ~~LOC~~ SOLN
3.0000 [IU] | Freq: Three times a day (TID) | SUBCUTANEOUS | Status: DC
  Administered 2020-07-18 – 2020-07-20 (×5): 3 [IU] via SUBCUTANEOUS

## 2020-07-18 MED ORDER — SODIUM CHLORIDE 0.9% FLUSH
10.0000 mL | Freq: Two times a day (BID) | INTRAVENOUS | Status: DC
  Administered 2020-07-18 – 2020-07-27 (×18): 10 mL
  Administered 2020-07-27: 20 mL
  Administered 2020-07-28 – 2020-07-31 (×7): 10 mL

## 2020-07-18 MED ORDER — FUROSEMIDE 40 MG PO TABS
40.0000 mg | ORAL_TABLET | Freq: Every day | ORAL | Status: DC
  Administered 2020-07-18 – 2020-07-29 (×12): 40 mg via ORAL
  Filled 2020-07-18 (×12): qty 1

## 2020-07-18 NOTE — Progress Notes (Signed)
Peripherally Inserted Central Catheter Placement  The IV Nurse has discussed with the patient and/or persons authorized to consent for the patient, the purpose of this procedure and the potential benefits and risks involved with this procedure.  The benefits include less needle sticks, lab draws from the catheter, and the patient may be discharged home with the catheter. Risks include, but not limited to, infection, bleeding, blood clot (thrombus formation), and puncture of an artery; nerve damage and irregular heartbeat and possibility to perform a PICC exchange if needed/ordered by physician.  Alternatives to this procedure were also discussed.  Bard Power PICC patient education guide, fact sheet on infection prevention and patient information card has been provided to patient /or left at bedside.    PICC Placement Documentation  PICC Single Lumen 07/18/20 PICC Right Brachial 37 cm 37 cm (Active)  Exposed Catheter (cm) 0 cm 07/18/20 1058  Site Assessment Clean;Dry;Intact 07/18/20 1058  Line Status Flushed;Saline locked;Blood return noted 07/18/20 1058  Dressing Type Transparent;Securing device 07/18/20 1058  Dressing Status Clean;Dry;Intact 07/18/20 1058  Antimicrobial disc in place? Yes 07/18/20 1058  Safety Lock Not Applicable 08/81/10 3159  Dressing Change Due 07/25/20 07/18/20 1058       Frances Maywood 07/18/2020, 11:00 AM

## 2020-07-18 NOTE — Progress Notes (Signed)
Placed pt back on HHFNC at this time due to low spo2. Pt in no distress, no increased WOB.

## 2020-07-18 NOTE — Progress Notes (Addendum)
[facility] PROGRESS NOTE    Progress Note  Annette Gibson  KWI:[id number] DOB: [birth year] DOA: 07/11/2020 PCP: Bridget Hartshorn, NP     Brief Narrative:   Annette Gibson. Annette Gibson is an 65 y.o. female past medical history of COPD not oxygen dependent, with mildly elevated pulmonary pressures, chronic kidney disease stage IV, diabetes mellitus type 2 with a last A1c of on 07/12/2020 of an A1c of 11.8, the hospital for shortness of breath and lethargy.  New onset diarrhea, but on the day of admission her dyspnea got worst and decided to come to the ED.  She initially required heated high flow and BiPAP now on high flow nasal cannula 8 L.  Significant Events: 07/11/2020 admission 07/14/2020 require BiPAP overnight.  Significant studies: 07/11/2020 chest x-ray bilateral hazy patchy opacities in right and mid lower lung 07/13/2020 chest x-ray improved variation but widespread multifocal pneumonia not improved. 07/17/2020 chest x-ray right upper lobe opacity. 07/12/2018  2D echo that showed n EF of 60% no wall motion abnormalities diastolic parameters were normal, right ventricular systolic function was elevated as well as pulmonary pressures, 36 mmHg.  Antibiotics: 07/11/2020 Rocephin  Microbiology data: 07/11/2020 blood culture: Pneumococcus  Procedures: 07/18/2020 PICC line  Assessment/Plan:   Acute respiratory failure with hypoxia secondarily to multifocal pneumonia/pneumococcal bacteremia and severe sepsis due to bacteremia: T-max of 98.9, she has remained afebrile. She required 8 L of oxygen on high flow nasal cannula. She is now refusing BiPAP. Continue Rocephin IV for 2 weeks insert PICC line 07/18/2020  COPD: Not exacerbation continue halos.  Pulmonary hypertension due to chronic COPD and obstructive sleep apnea: Tolerating diuresis, she was initially fluid resuscitated and was about 7 L positive now 2-1/2 we will continue IV Lasix. She will need a sleep study as an  outpatient.  Acute kidney injury on chronic kidney disease stage IV: 9 I am guessing it should be around 1.5-2. On admission it was 5 it improved with IV fluids now 2.0. We will continue oral Lasix.  Electrolyte imbalance/hypokalemia/hypomagnesemia: Replete and recheck in the morning.  DKA with hyperglycemia in the setting of diabetes mellitus type 2: Currently on long-acting insulin plus sliding, her blood glucose is worsening likely due to improvement in her renal failure we will add NovoLog 3 units with meal. She is not a candidate for oral hypoglycemic agents due to her renal dysfunction, I would definitely avoid Metformin as an outpatient. Hemoglobin A1c was 11 it would be a good idea to send her out on long-acting insulin.  Normocytic anemia/anemia of critical illness: No signs of overt bleeding, hemoglobin has remained relatively stable.    DVT prophylaxis: lovenxo Family Communication:none Status is: Inpatient  Remains inpatient appropriate because:Hemodynamically unstable   Dispo: The patient is from: Home              Anticipated d/c is to: Home              Anticipated d/c date is: 3 days              Patient currently is not medically stable to d/c.  Patient is awaiting transfer to [place] unit.   Difficult to place patient No        Code Status:     Code Status Orders  (From admission, onward)         Start     Ordered   07/12/20 0235  Full code  Continuous        07/12/20 0239  Code Status History    Date Active Date Inactive Code Status Order ID Comments User Context   07/11/2020 2117 07/12/2020 0239 Full Code [id number]  [name], DO ED   05/23/2017 2002 05/28/2017 2034 Full Code [id number]  [name], MD ED   Advance Care Planning Activity        IV Access:    Peripheral IV   Procedures and diagnostic studies:   DG CHEST PORT 1 VIEW  Result Date: 07/17/2020 CLINICAL DATA:  Respiratory failure with hypercapnia  EXAM: PORTABLE CHEST 1 VIEW COMPARISON:  July 15, 2020 FINDINGS: Airspace opacity throughout the right upper lobe persists. There is atelectatic change in the right lung base and to a lesser extent in the left base. Heart size and pulmonary vascularity are normal. No adenopathy. No bone lesions. IMPRESSION: Airspace opacity consistent with pneumonia persists in the right upper lobe. There has been mild clearing from the right base. There is atelectatic change in the right base and to a lesser extent in the left base. No new opacity appreciable. Stable cardiac silhouette. Electronically Signed   By: Lowella Grip III M.D.   On: 07/17/2020 09:18     Medical Consultants:    None.   Subjective:    [name] tolerating her diet she relates her breathing is better.  Objective:    Vitals:   07/18/20 0400 07/18/20 0500 07/18/20 0600 07/18/20 0700  BP: 133/75 139/64 140/60 (!) 144/57  Pulse: (!) 101 90 78 85  Resp: 17 (!) 21 (!) 24 (!) 21  Temp:    98 F (36.7 C)  TempSrc:    Oral  SpO2: (!) 88% 96% 94% 90%  Weight:  102.5 kg    Height:       SpO2: 90 % O2 Flow Rate (L/min): 8 L/min FiO2 (%): 70 %   Intake/Output Summary (Last 24 hours) at 07/18/2020 0803 Last data filed at 07/18/2020 0400 Gross per 24 hour  Intake 100 ml  Output 300 ml  Net -200 ml   Filed Weights   07/16/20 0336 07/17/20 0446 07/18/20 0500  Weight: 102.1 kg 102.7 kg 102.5 kg    Exam: General exam: In no acute distress. Respiratory system: Good air movement and clear to auscultation. Cardiovascular system: S1 & S2 heard, RRR. No JVD. Gastrointestinal system: Abdomen is nondistended, soft and nontender.  Extremities: No pedal edema. Skin: No rashes, lesions or ulcers Psychiatry: Judgement and insight appear normal. Mood & affect appropriate.    Data Reviewed:    Labs: Basic Metabolic Panel: Recent Labs  Lab 07/12/20 0625 07/12/20 1105 07/14/20 0402 07/14/20 2206 07/15/20 0237  07/16/20 0340 07/17/20 0201  NA 136   < > 141 142 141 140 139  K 5.5*   < > 3.7 4.1 3.8 3.9 3.1*  CL 98   < > 103 106 104 101 99  CO2 23   < > _0 GLUCOSE 455*   < > 163* 124* 102* 173* 183*  BUN 80*   < > 72* 70* 68* 66* 58*  CREATININE 4.19*   < > 3.00* 2.73* 2.65* 2.32* 2.01*  CALCIUM 8.6*   < > 9.4 9.5 9.6 9.8 10.0  MG 2.0  --  1.7  --  1.6*  --  1.7  PHOS 5.0*  --  3.5  --  4.2  --  4.7*   < > = values in this interval not displayed.   GFR Estimated Creatinine Clearance:  29.2 mL/min (A) (by C-G formula based on SCr of 2.01 mg/dL (H)). Liver Function Tests: Recent Labs  Lab 07/11/20 1738 07/13/20 0920 07/16/20 0340 07/17/20 0201  AST 36  --  24 21  ALT 23  --  16 15  ALKPHOS 61  --  117 121  BILITOT 1.6*  --  0.3 0.3  PROT 6.1*  --  4.9* 5.6*  ALBUMIN 2.1* 1.5* 1.5* 1.5*   No results for input(s): LIPASE, AMYLASE in the last 168 hours. No results for input(s): AMMONIA in the last 168 hours. Coagulation profile Recent Labs  Lab 07/11/20 1738  INR 1.3*   COVID-19 Labs  No results for input(s): DDIMER, FERRITIN, LDH, CRP in the last 72 hours.  Lab Results  Component Value Date   [place] NEGATIVE 07/11/2020   [place] NEGATIVE 05/01/2020    CBC: Recent Labs  Lab 07/11/20 1738 07/11/20 1751 07/13/20 0408 07/14/20 0402 07/15/20 0237 07/16/20 0340 07/16/20 2347  WBC 11.9*   < > 13.9* 16.9* 19.4* 18.4* 15.5*  NEUTROABS 10.2*  --   --   --   --   --   --   HGB 10.8*   < > 10.4* 10.2* 10.6* 9.5* 9.6*  HCT 35.4*   < > 34.1* 32.7* 33.0* 31.3* 30.4*  MCV 100.0   < > 99.4 100.3* 98.2 99.7 97.4  PLT PLATELET CLUMPS NOTED ON SMEAR, UNABLE TO ESTIMATE   < > 208 239 200 202 228   < > = values in this interval not displayed.   Cardiac Enzymes: No results for input(s): CKTOTAL, CKMB, CKMBINDEX, TROPONINI in the last 168 hours. BNP (last 3 results) No results for input(s): PROBNP in the last 8760 hours. CBG: Recent Labs  Lab 07/17/20 0717  07/17/20 1148 07/17/20 1616 07/17/20 2119 07/18/20 0724  GLUCAP 99 239* 226* 191* 165*   D-Dimer: No results for input(s): DDIMER in the last 72 hours. Hgb A1c: No results for input(s): HGBA1C in the last 72 hours. Lipid Profile: No results for input(s): CHOL, HDL, LDLCALC, TRIG, CHOLHDL, LDLDIRECT in the last 72 hours. Thyroid function studies: No results for input(s): TSH, T4TOTAL, T3FREE, THYROIDAB in the last 72 hours.  Invalid input(s): FREET3 Anemia work up: No results for input(s): VITAMINB12, FOLATE, FERRITIN, TIBC, IRON, RETICCTPCT in the last 72 hours. Sepsis Labs: Recent Labs  Lab 07/11/20 1942 07/11/20 2114 07/12/20 0121 07/12/20 0551 07/12/20 0625 07/14/20 0402 07/15/20 0237 07/16/20 0340 07/16/20 2347  PROCALCITON  --  37.25  --   --   --   --   --   --   --   WBC  --   --   --   --    < > 16.9* 19.4* 18.4* 15.5*  LATICACIDVEN 3.1*  --  2.8* 2.3*  --   --   --  1.0  --    < > = values in this interval not displayed.   Microbiology Recent Results (from the past 240 hour(s))  Blood culture (routine x 2)     Status: None   Collection Time: 07/11/20  5:07 PM   Specimen: BLOOD LEFT HAND  Result Value Ref Range Status   Specimen Description BLOOD LEFT HAND  Final   Special Requests   Final    BOTTLES DRAWN AEROBIC AND ANAEROBIC Blood Culture results may not be optimal due to an inadequate volume of blood received in culture bottles   Culture   Final    NO GROWTH 5 DAYS  Performed at [facility], [place] [address], [place], [place] [address]    Report Status 07/16/2020 FINAL  Final  Resp Panel by RT-PCR (Flu A&B, Covid) Nasopharyngeal Swab     Status: None   Collection Time: 07/11/20  5:23 PM   Specimen: Nasopharyngeal Swab; Nasopharyngeal(NP) swabs in vial transport medium  Result Value Ref Range Status   SARS Coronavirus 2 by RT PCR NEGATIVE NEGATIVE Final    Comment: (NOTE) SARS-CoV-2 target nucleic acids are NOT DETECTED.  The SARS-CoV-2 RNA is  generally detectable in upper respiratory specimens during the acute phase of infection. The lowest concentration of SARS-CoV-2 viral copies this assay can detect is 138 copies/mL. A negative result does not preclude SARS-Cov-2 infection and should not be used as the sole basis for treatment or other patient management decisions. A negative result may occur with  improper specimen collection/handling, submission of specimen other than nasopharyngeal swab, presence of viral mutation(s) within the areas targeted by this assay, and inadequate number of viral copies(<138 copies/mL). A negative result must be combined with clinical observations, patient history, and epidemiological information. The expected result is Negative.  Fact Sheet for Patients:  EntrepreneurPulse.com.au  Fact Sheet for Healthcare Providers:  IncredibleEmployment.be  This test is no t yet approved or cleared by the [place] FDA and  has been authorized for detection and/or diagnosis of SARS-CoV-2 by FDA under an Emergency Use Authorization (EUA). This EUA will remain  in effect (meaning this test can be used) for the duration of the COVID-19 declaration under Section 564(b)(1) of the Act, 21 U.S.C.section 360bbb-3(b)(1), unless the authorization is terminated  or revoked sooner.       Influenza A by PCR NEGATIVE NEGATIVE Final   Influenza B by PCR NEGATIVE NEGATIVE Final    Comment: (NOTE) The Xpert Xpress SARS-CoV-2/FLU/RSV plus assay is intended as an aid in the diagnosis of influenza from Nasopharyngeal swab specimens and should not be used as a sole basis for treatment. Nasal washings and aspirates are unacceptable for Xpert Xpress SARS-CoV-2/FLU/RSV testing.  Fact Sheet for Patients: EntrepreneurPulse.com.au  Fact Sheet for Healthcare Providers: IncredibleEmployment.be  This test is not yet approved or cleared by the [place] FDA and has been authorized for detection and/or diagnosis of SARS-CoV-2 by FDA under an Emergency Use Authorization (EUA). This EUA will remain in effect (meaning this test can be used) for the duration of the COVID-19 declaration under Section 564(b)(1) of the Act, 21 U.S.C. section 360bbb-3(b)(1), unless the authorization is terminated or revoked.  Performed at [facility], [place] [address] [place], [place] [address]   Blood culture (routine x 2)     Status: Abnormal   Collection Time: 07/11/20  5:38 PM   Specimen: BLOOD  Result Value Ref Range Status   Specimen Description BLOOD LEFT ANTECUBITAL  Final   Special Requests   Final    BOTTLES DRAWN AEROBIC AND ANAEROBIC Blood Culture adequate volume   Culture  Setup Time   Final    GRAM POSITIVE COCCI IN PAIRS IN CHAINS IN BOTH AEROBIC AND ANAEROBIC BOTTLES CRITICAL RESULT CALLED TO, READ BACK BY AND VERIFIED WITH: Park Breed [id number] FCP Performed at [facility], [place] [address] [place], [place] [address]    Culture STREPTOCOCCUS PNEUMONIAE (A)  Final   Report Status 07/14/2020 FINAL  Final   Organism ID, Bacteria STREPTOCOCCUS PNEUMONIAE  Final      Susceptibility   Streptococcus pneumoniae - MIC*  ERYTHROMYCIN <=0.12 SENSITIVE Sensitive     LEVOFLOXACIN 2 SENSITIVE Sensitive     VANCOMYCIN 0.5 SENSITIVE Sensitive     PENICILLIN (meningitis) <=0.06 SENSITIVE Sensitive     PENO - penicillin <=0.06      PENICILLIN (non-meningitis) <=0.06 SENSITIVE Sensitive     PENICILLIN (oral) <=0.06 SENSITIVE Sensitive     CEFTRIAXONE (non-meningitis) <=0.12 SENSITIVE Sensitive     CEFTRIAXONE (meningitis) <=0.12 SENSITIVE Sensitive     * STREPTOCOCCUS PNEUMONIAE  Blood Culture ID Panel (Reflexed)     Status: Abnormal   Collection Time: 07/11/20  5:38 PM  Result Value Ref Range Status   Enterococcus faecalis NOT DETECTED NOT DETECTED Final   Enterococcus Faecium NOT DETECTED NOT DETECTED Final   Listeria  monocytogenes NOT DETECTED NOT DETECTED Final   Staphylococcus species NOT DETECTED NOT DETECTED Final   Staphylococcus aureus (BCID) NOT DETECTED NOT DETECTED Final   Staphylococcus epidermidis NOT DETECTED NOT DETECTED Final   Staphylococcus lugdunensis NOT DETECTED NOT DETECTED Final   Streptococcus species DETECTED (A) NOT DETECTED Final    Comment: CRITICAL RESULT CALLED TO, READ BACK BY AND VERIFIED WITH: PHARMD JEREMY F. [phone] [id number] FCP    Streptococcus agalactiae NOT DETECTED NOT DETECTED Final   Streptococcus pneumoniae DETECTED (A) NOT DETECTED Final    Comment: CRITICAL RESULT CALLED TO, READ BACK BY AND VERIFIED WITH: PHARMD JEREMY F. [phone] [id number] FCP    Streptococcus pyogenes NOT DETECTED NOT DETECTED Final   A.calcoaceticus-baumannii NOT DETECTED NOT DETECTED Final   Bacteroides fragilis NOT DETECTED NOT DETECTED Final   Enterobacterales NOT DETECTED NOT DETECTED Final   Enterobacter cloacae complex NOT DETECTED NOT DETECTED Final   Escherichia coli NOT DETECTED NOT DETECTED Final   Klebsiella aerogenes NOT DETECTED NOT DETECTED Final   Klebsiella oxytoca NOT DETECTED NOT DETECTED Final   Klebsiella pneumoniae NOT DETECTED NOT DETECTED Final   Proteus species NOT DETECTED NOT DETECTED Final   Salmonella species NOT DETECTED NOT DETECTED Final   Serratia marcescens NOT DETECTED NOT DETECTED Final   Haemophilus influenzae NOT DETECTED NOT DETECTED Final   Neisseria meningitidis NOT DETECTED NOT DETECTED Final   Pseudomonas aeruginosa NOT DETECTED NOT DETECTED Final   Stenotrophomonas maltophilia NOT DETECTED NOT DETECTED Final   Candida albicans NOT DETECTED NOT DETECTED Final   Candida auris NOT DETECTED NOT DETECTED Final   Candida glabrata NOT DETECTED NOT DETECTED Final   Candida krusei NOT DETECTED NOT DETECTED Final   Candida parapsilosis NOT DETECTED NOT DETECTED Final   Candida tropicalis NOT DETECTED NOT DETECTED Final   Cryptococcus neoformans/gattii NOT  DETECTED NOT DETECTED Final    Comment: Performed at [facility], [address] [place], [place] [address]  MRSA PCR Screening     Status: None   Collection Time: 07/12/20  2:34 AM   Specimen: Nasopharyngeal  Result Value Ref Range Status   MRSA by PCR NEGATIVE NEGATIVE Final    Comment:        The GeneXpert MRSA Assay (FDA approved for NASAL specimens only), is one component of a comprehensive MRSA colonization surveillance program. It is not intended to diagnose MRSA infection nor to guide or monitor treatment for MRSA infections. Performed at [facility], [place] [address] [place], [place] [address]   Urine culture     Status: Abnormal   Collection Time: 07/12/20  6:53 AM   Specimen: In/Out Cath Urine  Result Value Ref Range Status   Specimen Description IN/OUT CATH URINE  Final   Special Requests   Final    NONE Performed at [facility], [place] [address], [place],  [address]    Culture MULTIPLE SPECIES PRESENT, SUGGEST RECOLLECTION (A)  Final   Report Status 07/13/2020 FINAL  Final     Medications:   . Chlorhexidine Gluconate Cloth  6 each Topical Daily  . heparin  5,000 Units Subcutaneous Q8H  . insulin aspart  0-5 Units Subcutaneous QHS  . insulin aspart  0-9 Units Subcutaneous TID WC  . insulin detemir  10 Units Subcutaneous BID  . ipratropium-albuterol  3 mL Nebulization TID   Continuous Infusions: . cefTRIAXone (ROCEPHIN)  IV Stopped (07/17/20 1718)      LOS: 7 days   Charlynne Cousins  [facility]  07/18/2020, 8:03 AM

## 2020-07-19 DIAGNOSIS — R7881 Bacteremia: Secondary | ICD-10-CM | POA: Diagnosis not present

## 2020-07-19 DIAGNOSIS — E1110 Type 2 diabetes mellitus with ketoacidosis without coma: Secondary | ICD-10-CM | POA: Diagnosis not present

## 2020-07-19 DIAGNOSIS — J9601 Acute respiratory failure with hypoxia: Secondary | ICD-10-CM | POA: Diagnosis not present

## 2020-07-19 DIAGNOSIS — N179 Acute kidney failure, unspecified: Secondary | ICD-10-CM | POA: Diagnosis not present

## 2020-07-19 LAB — CBC WITH DIFFERENTIAL/PLATELET
Abs Immature Granulocytes: 0.48 10*3/uL — ABNORMAL HIGH (ref 0.00–0.07)
Basophils Absolute: 0 10*3/uL (ref 0.0–0.1)
Basophils Relative: 0 %
Eosinophils Absolute: 0.4 10*3/uL (ref 0.0–0.5)
Eosinophils Relative: 3 %
HCT: 28.2 % — ABNORMAL LOW (ref 36.0–46.0)
Hemoglobin: 8.8 g/dL — ABNORMAL LOW (ref 12.0–15.0)
Immature Granulocytes: 4 %
Lymphocytes Relative: 10 %
Lymphs Abs: 1.3 10*3/uL (ref 0.7–4.0)
MCH: 31.1 pg (ref 26.0–34.0)
MCHC: 31.2 g/dL (ref 30.0–36.0)
MCV: 99.6 fL (ref 80.0–100.0)
Monocytes Absolute: 0.4 10*3/uL (ref 0.1–1.0)
Monocytes Relative: 3 %
Neutro Abs: 9.9 10*3/uL — ABNORMAL HIGH (ref 1.7–7.7)
Neutrophils Relative %: 80 %
Platelets: UNDETERMINED 10*3/uL (ref 150–400)
RBC: 2.83 MIL/uL — ABNORMAL LOW (ref 3.87–5.11)
RDW: 13.3 % (ref 11.5–15.5)
WBC: 12.4 10*3/uL — ABNORMAL HIGH (ref 4.0–10.5)
nRBC: 0 % (ref 0.0–0.2)

## 2020-07-19 LAB — BASIC METABOLIC PANEL
Anion gap: 10 (ref 5–15)
BUN: 44 mg/dL — ABNORMAL HIGH (ref 8–23)
CO2: 26 mmol/L (ref 22–32)
Calcium: 9.6 mg/dL (ref 8.9–10.3)
Chloride: 103 mmol/L (ref 98–111)
Creatinine, Ser: 1.66 mg/dL — ABNORMAL HIGH (ref 0.44–1.00)
GFR, Estimated: 34 mL/min — ABNORMAL LOW (ref >60)
Glucose, Bld: 131 mg/dL — ABNORMAL HIGH (ref 70–99)
Potassium: 4.7 mmol/L (ref 3.5–5.1)
Sodium: 139 mmol/L (ref 135–145)

## 2020-07-19 LAB — GLUCOSE, CAPILLARY
Glucose-Capillary: 88 mg/dL (ref 70–99)
Glucose-Capillary: 236 mg/dL — ABNORMAL HIGH (ref 70–99)
Glucose-Capillary: 185 mg/dL — ABNORMAL HIGH (ref 70–99)
Glucose-Capillary: 204 mg/dL — ABNORMAL HIGH (ref 70–99)

## 2020-07-19 LAB — D-DIMER, QUANTITATIVE: D-Dimer, Quant: 3.8 ug/mL-FEU — ABNORMAL HIGH (ref 0.00–0.50)

## 2020-07-19 MED ORDER — IPRATROPIUM-ALBUTEROL 0.5-2.5 (3) MG/3ML IN SOLN
3.0000 mL | Freq: Four times a day (QID) | RESPIRATORY_TRACT | Status: DC
  Administered 2020-07-19 – 2020-07-21 (×9): 3 mL via RESPIRATORY_TRACT
  Filled 2020-07-19 (×9): qty 3

## 2020-07-19 MED ORDER — MAGIC MOUTHWASH W/LIDOCAINE
15.0000 mL | Freq: Three times a day (TID) | ORAL | Status: DC | PRN
  Filled 2020-07-19: qty 15

## 2020-07-19 NOTE — Progress Notes (Signed)
Inpatient Diabetes Program Recommendations  AACE/ADA: New Consensus Statement on Inpatient Glycemic Control (2015)  Target Ranges:  Prepandial:   less than 140 mg/dL      Peak postprandial:   less than 180 mg/dL (1-2 hours)      Critically ill patients:  140 - 180 mg/dL   Lab Results  Component Value Date   GLUCAP 236 (H) 07/19/2020   HGBA1C 11.8 (H) 07/11/2020    Review of Glycemic Control   Current orders for Inpatient glycemic control:   Levemir 12 units BID Novolog 0-9 units TID with meals and 0-5 HS + 3 units TID with meals  Post-prandial blood sugars elevated.  Inpatient Diabetes Program Recommendations:     Increase Novolog to 4 units TID with meals if eating > 50% meal  Continue to follow.  Thank you. Lorenda Peck, RD, LDN, CDE Inpatient Diabetes Coordinator [phone]

## 2020-07-19 NOTE — Progress Notes (Signed)
Called to check on status of room and it is being cleaned per nurse secretary. RN to call when the room is ready. Bartholomew Crews, RN 07/19/2020 3:35 PM

## 2020-07-19 NOTE — Progress Notes (Signed)
PROGRESS NOTE    Annette Gibson  FIE:[id number] DOB: Jul 09, 1955 DOA: 07/11/2020 PCP: Bridget Hartshorn, NP   Brief Narrative: Annette Gibson is a 65 y.o. female with a history of COPD, diastolic heart failure, diabetes mellitus, type 2, CKD stage IV. Patient presented secondary to dyspnea and lethargy in setting of acute respiratory failure and DKA. She was managed with oxygen via [place], IV insulin.   Assessment & Plan:   Active Problems:   Acute respiratory failure with hypoxia (HCC)   Pneumonia of right lung due to Streptococcus pneumoniae (HCC)   Acute renal failure superimposed on chronic kidney disease ([place])   Diabetic ketoacidosis without coma associated with type 2 diabetes mellitus (HCC)   Severe sepsis (HCC)   Pneumococcal bacteremia   Acute respiratory failure with hypoxia In setting of multifocal pneumonia in addition to underlying COPD. Patient reports no personal history of COVID-19 infection. Patient still requiring significant amounts of oxygen. Transthoracic Echocardiogram significant for RV dilation and decreased function -D-dimer; if elevated, will obtain V/Q scan  Multifocal pneumonia Likely secondary to strep pneumonia. Urine ag positive and blood culture positive. Started on Ceftriaxone/Azithromycin empirically and transitioned to Ceftriaxone monotherapy. -Continue Ceftriaxone as below  Streptococcus  pneumonia bacteremia Likely secondary to pneumonia. Transthoracic Echocardiogram without evidence of vegetation. Started on ceftriaxone for treatment. -Continue Ceftriaxone IV with plan for 2 week treatment  Severe sepsis Secondary to above problems. Present on admission. Resolved.  COPD Stable -Continue Duoneb and albuterol prn  Pulmonary hypertension Patient managed with IV diuresis  Possible OSA Outpatient sleep study recommended  AKI on CKD stage IV Baseline creatinine of 2.48 from 04/2020. Creatinine of 4.76 on admission. Improvement with  IV fluids in setting of DKA requiring IV insulin. She was subsequently transitioned to Lasix IV  Hypokalemia Hypomagnesemia Repleted  DKA Diabetes mellitus, type 2 Patient initially managed on IV insulin and transitioned to Levemir/SSI -Continue Levemir 12 units BID -Continue SSi  Normocytic anemia Baseline hemoglobin of about 12.1 back in November of 2021. Hemoglobin of 10.8 on admission with downward drift. No obvious evidence of bleeding.   DVT prophylaxis: Heparin subq Code Status:   Code Status: Full Code Family Communication: None at bedside Disposition Plan: Discharge likely in several days pending ability to wean oxygen   Consultants:   PCCM  Procedures:   TRANSTHORACIC ECHOCARDIOGRAM (07/12/2020) IMPRESSIONS    1. Left ventricular ejection fraction, by estimation, is 60 to 65%. The  left ventricle has normal function. The left ventricle has no regional  wall motion abnormalities. Left ventricular diastolic parameters were  normal.  2. Right ventricular systolic function is moderately reduced. The right  ventricular size is moderately enlarged. There is mildly elevated  pulmonary artery systolic pressure.  3. The mitral valve is normal in structure. Trivial mitral valve  regurgitation. No evidence of mitral stenosis.  4. The aortic valve is tricuspid. Aortic valve regurgitation is not  visualized. No aortic stenosis is present.  5. The inferior vena cava is dilated in size with <50% respiratory  variability, suggesting right atrial pressure of 15 mmHg.  Antimicrobials:  Ceftriaxone  Azithromycin    Subjective: No dyspnea or chest pain.  Objective: Vitals:   07/19/20 0700 07/19/20 0800 07/19/20 0818 07/19/20 0830  BP: (!) 114/56 (!) 109/51    Pulse: 74 68    Resp: (!) 22 20    Temp:   98.5 F (36.9 C)   TempSrc:   Axillary   SpO2: 100% 100%  98%  Weight:      Height:        Intake/Output Summary (Last 24 hours) at 07/19/2020 0911 Last  data filed at 07/19/2020 0800 Gross per 24 hour  Intake 238.81 ml  Output 1825 ml  Net -1586.19 ml   Filed Weights   07/17/20 0446 07/18/20 0500 07/19/20 0500  Weight: 102.7 kg 102.5 kg 102.8 kg    Examination:  General exam: Appears calm and comfortable Respiratory system: Diffuse rales posteriorly with mild wheezes anteriorly. Respiratory effort normal. No tachypnea Cardiovascular system: S1 & S2 heard, RRR. No murmurs, rubs, gallops or clicks. Gastrointestinal system: Abdomen is nondistended, soft and nontender. No organomegaly or masses felt. Normal bowel sounds heard. Central nervous system: Alert and oriented. No focal neurological deficits. Musculoskeletal: No edema. No calf tenderness Skin: No cyanosis. No rashes Psychiatry: Judgement and insight appear normal. Mood & affect appropriate.     Data Reviewed: I have personally reviewed following labs and imaging studies  CBC Lab Results  Component Value Date   WBC 12.4 (H) 07/19/2020   RBC 2.83 (L) 07/19/2020   HGB 8.8 (L) 07/19/2020   HCT 28.2 (L) 07/19/2020   MCV 99.6 07/19/2020   MCH 31.1 07/19/2020   PLT PLATELET CLUMPS NOTED ON SMEAR, UNABLE TO ESTIMATE 07/19/2020   MCHC 31.2 07/19/2020   RDW 13.3 07/19/2020   LYMPHSABS 1.3 07/19/2020   MONOABS 0.4 07/19/2020   EOSABS 0.4 07/19/2020   BASOSABS 0.0 80/32/1224     Last metabolic panel Lab Results  Component Value Date   NA 139 07/19/2020   K 4.7 07/19/2020   CL 103 07/19/2020   CO2 26 07/19/2020   BUN 44 (H) 07/19/2020   CREATININE 1.66 (H) 07/19/2020   GLUCOSE 131 (H) 07/19/2020   GFRNONAA 34 (L) 07/19/2020   GFRAA 68 01/20/2018   CALCIUM 9.6 07/19/2020   PHOS 4.7 (H) 07/17/2020   PROT 5.6 (L) 07/17/2020   ALBUMIN 1.5 (L) 07/17/2020   LABGLOB 2.2 01/20/2018   AGRATIO 2.0 01/20/2018   BILITOT 0.3 07/17/2020   ALKPHOS 121 07/17/2020   AST 21 07/17/2020   ALT 15 07/17/2020   ANIONGAP 10 07/19/2020    CBG (last 3)  Recent Labs     07/18/20 1548 07/18/20 2119 07/19/20 0817  GLUCAP 140* 158* 88     GFR: Estimated Creatinine Clearance: 35.5 mL/min (A) (by C-G formula based on SCr of 1.66 mg/dL (H)).  Coagulation Profile: No results for input(s): INR, PROTIME in the last 168 hours.  Recent Results (from the past 240 hour(s))  Blood culture (routine x 2)     Status: None   Collection Time: 07/11/20  5:07 PM   Specimen: BLOOD LEFT HAND  Result Value Ref Range Status   Specimen Description BLOOD LEFT HAND  Final   Special Requests   Final    BOTTLES DRAWN AEROBIC AND ANAEROBIC Blood Culture results may not be optimal due to an inadequate volume of blood received in culture bottles   Culture   Final    NO GROWTH 5 DAYS Performed at [facility], [place] [address], [place], [place] [address]    Report Status 07/16/2020 FINAL  Final  Resp Panel by RT-PCR (Flu A&B, Covid) Nasopharyngeal Swab     Status: None   Collection Time: 07/11/20  5:23 PM   Specimen: Nasopharyngeal Swab; Nasopharyngeal(NP) swabs in vial transport medium  Result Value Ref Range Status   SARS Coronavirus 2 by RT PCR NEGATIVE NEGATIVE Final  Comment: (NOTE) SARS-CoV-2 target nucleic acids are NOT DETECTED.  The SARS-CoV-2 RNA is generally detectable in upper respiratory specimens during the acute phase of infection. The lowest concentration of SARS-CoV-2 viral copies this assay can detect is 138 copies/mL. A negative result does not preclude SARS-Cov-2 infection and should not be used as the sole basis for treatment or other patient management decisions. A negative result may occur with  improper specimen collection/handling, submission of specimen other than nasopharyngeal swab, presence of viral mutation(s) within the areas targeted by this assay, and inadequate number of viral copies(<138 copies/mL). A negative result must be combined with clinical observations, patient history, and epidemiological information. The expected result  is Negative.  Fact Sheet for Patients:  EntrepreneurPulse.com.au  Fact Sheet for Healthcare Providers:  IncredibleEmployment.be  This test is no t yet approved or cleared by the [place] FDA and  has been authorized for detection and/or diagnosis of SARS-CoV-2 by FDA under an Emergency Use Authorization (EUA). This EUA will remain  in effect (meaning this test can be used) for the duration of the COVID-19 declaration under Section 564(b)(1) of the Act, 21 U.S.C.section 360bbb-3(b)(1), unless the authorization is terminated  or revoked sooner.       Influenza A by PCR NEGATIVE NEGATIVE Final   Influenza B by PCR NEGATIVE NEGATIVE Final    Comment: (NOTE) The Xpert Xpress SARS-CoV-2/FLU/RSV plus assay is intended as an aid in the diagnosis of influenza from Nasopharyngeal swab specimens and should not be used as a sole basis for treatment. Nasal washings and aspirates are unacceptable for Xpert Xpress SARS-CoV-2/FLU/RSV testing.  Fact Sheet for Patients: EntrepreneurPulse.com.au  Fact Sheet for Healthcare Providers: IncredibleEmployment.be  This test is not yet approved or cleared by the [place] FDA and has been authorized for detection and/or diagnosis of SARS-CoV-2 by FDA under an Emergency Use Authorization (EUA). This EUA will remain in effect (meaning this test can be used) for the duration of the COVID-19 declaration under Section 564(b)(1) of the Act, 21 U.S.C. section 360bbb-3(b)(1), unless the authorization is terminated or revoked.  Performed at [facility], [place] [address] [place], [place] [address]   Blood culture (routine x 2)     Status: Abnormal   Collection Time: 07/11/20  5:38 PM   Specimen: BLOOD  Result Value Ref Range Status   Specimen Description BLOOD LEFT ANTECUBITAL  Final   Special Requests   Final    BOTTLES DRAWN AEROBIC AND ANAEROBIC Blood Culture  adequate volume   Culture  Setup Time   Final    GRAM POSITIVE COCCI IN PAIRS IN CHAINS IN BOTH AEROBIC AND ANAEROBIC BOTTLES CRITICAL RESULT CALLED TO, READ BACK BY AND VERIFIED WITH: Park Breed [phone] FCP Performed at [facility], [place] [address], [place], [place] [address]    Culture STREPTOCOCCUS PNEUMONIAE (A)  Final   Report Status 07/14/2020 FINAL  Final   Organism ID, Bacteria STREPTOCOCCUS PNEUMONIAE  Final      Susceptibility   Streptococcus pneumoniae - MIC*    ERYTHROMYCIN <=0.12 SENSITIVE Sensitive     LEVOFLOXACIN 2 SENSITIVE Sensitive     VANCOMYCIN 0.5 SENSITIVE Sensitive     PENICILLIN (meningitis) <=0.06 SENSITIVE Sensitive     PENO - penicillin <=0.06      PENICILLIN (non-meningitis) <=0.06 SENSITIVE Sensitive     PENICILLIN (oral) <=0.06 SENSITIVE Sensitive     CEFTRIAXONE (non-meningitis) <=0.12 SENSITIVE Sensitive     CEFTRIAXONE (meningitis) <=0.12 SENSITIVE Sensitive     *  STREPTOCOCCUS PNEUMONIAE  Blood Culture ID Panel (Reflexed)     Status: Abnormal   Collection Time: 07/11/20  5:38 PM  Result Value Ref Range Status   Enterococcus faecalis NOT DETECTED NOT DETECTED Final   Enterococcus Faecium NOT DETECTED NOT DETECTED Final   Listeria monocytogenes NOT DETECTED NOT DETECTED Final   Staphylococcus species NOT DETECTED NOT DETECTED Final   Staphylococcus aureus (BCID) NOT DETECTED NOT DETECTED Final   Staphylococcus epidermidis NOT DETECTED NOT DETECTED Final   Staphylococcus lugdunensis NOT DETECTED NOT DETECTED Final   Streptococcus species DETECTED (A) NOT DETECTED Final    Comment: CRITICAL RESULT CALLED TO, READ BACK BY AND VERIFIED WITH: PHARMD JEREMY F. [phone] FCP    Streptococcus agalactiae NOT DETECTED NOT DETECTED Final   Streptococcus pneumoniae DETECTED (A) NOT DETECTED Final    Comment: CRITICAL RESULT CALLED TO, READ BACK BY AND VERIFIED WITH: PHARMD JEREMY F. [phone] FCP    Streptococcus pyogenes NOT DETECTED  NOT DETECTED Final   A.calcoaceticus-baumannii NOT DETECTED NOT DETECTED Final   Bacteroides fragilis NOT DETECTED NOT DETECTED Final   Enterobacterales NOT DETECTED NOT DETECTED Final   Enterobacter cloacae complex NOT DETECTED NOT DETECTED Final   Escherichia coli NOT DETECTED NOT DETECTED Final   Klebsiella aerogenes NOT DETECTED NOT DETECTED Final   Klebsiella oxytoca NOT DETECTED NOT DETECTED Final   Klebsiella pneumoniae NOT DETECTED NOT DETECTED Final   Proteus species NOT DETECTED NOT DETECTED Final   Salmonella species NOT DETECTED NOT DETECTED Final   Serratia marcescens NOT DETECTED NOT DETECTED Final   Haemophilus influenzae NOT DETECTED NOT DETECTED Final   Neisseria meningitidis NOT DETECTED NOT DETECTED Final   Pseudomonas aeruginosa NOT DETECTED NOT DETECTED Final   Stenotrophomonas maltophilia NOT DETECTED NOT DETECTED Final   Candida albicans NOT DETECTED NOT DETECTED Final   Candida auris NOT DETECTED NOT DETECTED Final   Candida glabrata NOT DETECTED NOT DETECTED Final   Candida krusei NOT DETECTED NOT DETECTED Final   Candida parapsilosis NOT DETECTED NOT DETECTED Final   Candida tropicalis NOT DETECTED NOT DETECTED Final   Cryptococcus neoformans/gattii NOT DETECTED NOT DETECTED Final    Comment: Performed at [facility], [address] [place], [place] [address]  MRSA PCR Screening     Status: None   Collection Time: 07/12/20  2:34 AM   Specimen: Nasopharyngeal  Result Value Ref Range Status   MRSA by PCR NEGATIVE NEGATIVE Final    Comment:        The GeneXpert MRSA Assay (FDA approved for NASAL specimens only), is one component of a comprehensive MRSA colonization surveillance program. It is not intended to diagnose MRSA infection nor to guide or monitor treatment for MRSA infections. Performed at [facility], [place] [address], [place], [place] [address]   Urine culture     Status: Abnormal   Collection Time: 07/12/20  6:53 AM    Specimen: In/Out Cath Urine  Result Value Ref Range Status   Specimen Description IN/OUT CATH URINE  Final   Special Requests   Final    NONE Performed at [facility], [place] [address], [place], [place] [address]    Culture MULTIPLE SPECIES PRESENT, SUGGEST RECOLLECTION (A)  Final   Report Status 07/13/2020 FINAL  Final        Radiology Studies: [place] EKG SITE RITE  Result Date: 07/18/2020 If Site Rite image not attached, placement could not be confirmed due to current cardiac rhythm.  Scheduled Meds: . Chlorhexidine Gluconate Cloth  6 each Topical Daily  . furosemide  40 mg Oral Daily  . heparin  5,000 Units Subcutaneous Q8H  . insulin aspart  0-5 Units Subcutaneous QHS  . insulin aspart  0-9 Units Subcutaneous TID WC  . insulin aspart  3 Units Subcutaneous TID WC  . insulin detemir  12 Units Subcutaneous BID  . ipratropium-albuterol  3 mL Nebulization Q4H  . sodium chloride flush  10-40 mL Intracatheter Q12H   Continuous Infusions: . sodium chloride 10 mL/hr at 07/19/20 0800  . cefTRIAXone (ROCEPHIN)  IV Stopped (07/18/20 1806)     LOS: 8 days     Cordelia Poche, MD [facility] 07/19/2020, 9:11 AM  If 7PM-7AM, please contact night-coverage [email or web address]

## 2020-07-19 NOTE — Progress Notes (Signed)
Attempted to call report to new unit and RN to call back. Bartholomew Crews, RN 07/19/2020 5:03 PM

## 2020-07-19 NOTE — Progress Notes (Signed)
Physical Therapy Treatment Patient Details Name: Annette Gibson. Dematteo MRN: [id number] DOB: 08-Oct-1955 Today's Date: 07/19/2020    History of Present Illness 65 yo admitted 2/8 with SOB and lethargy reporting poor PO intake after getting sick from [facility]. Pt with CAP requiring Bipap. PMhx: COPD, CHF, CKD, DM, anxiety    PT Comments    Patient up on arrival. Tolerated in room ambulation (to extent allowed by [facility]) with sats >90% throughout (on 35L FiO2 90%). Re-instructed on use and frequency with incentive spirometer.    Follow Up Recommendations  Home health PT     Equipment Recommendations  None recommended by PT    Recommendations for Other Services       Precautions / Restrictions Precautions Precautions: Fall Precaution Comments: watch sats, HHFNC Restrictions Weight Bearing Restrictions: No    Mobility  Bed Mobility                    Transfers Overall transfer level: Needs assistance Equipment used: Rolling walker (2 wheeled) Transfers: Sit to/from Stand Sit to Stand: Min guard         General transfer comment: guarding assist for safety and for lines  Ambulation/Gait Ambulation/Gait assistance: Min guard Gait Distance (Feet): 24 Feet (6 ft fwd/back x 2 reps) Assistive device: Rolling walker (2 wheeled) Gait Pattern/deviations: Step-to pattern;Decreased stride length;Trunk flexed     General Gait Details: very small steps despite room cleared to allow 6 ft of walking space at a time   Stairs             Wheelchair Mobility    Modified Rankin (Stroke Patients Only)       Balance Overall balance assessment: Needs assistance   Sitting balance-Leahy Scale: Good     Standing balance support: Bilateral upper extremity supported Standing balance-Leahy Scale: Poor Standing balance comment: reliant on single and bil UE support in standing                            Cognition Arousal/Alertness: Awake/alert Behavior During  Therapy: WFL for tasks assessed/performed Overall Cognitive Status: Within Functional Limits for tasks assessed                                        Exercises Other Exercises Other Exercises: IS x 10 reps (2-3 breaths and rest) max of 510m; vc for slower, deeper breath    General Comments General comments (skin integrity, edema, etc.): on 35L FiO2 90% HHFNC during session with sats >90% during activity and upon sitting for rest; end of session 96%      Pertinent Vitals/Pain Pain Assessment: No/denies pain    Home Living                      Prior Function            PT Goals (current goals can now be found in the care plan section) Acute Rehab PT Goals Patient Stated Goal: return home Time For Goal Achievement: 07/30/20 Potential to Achieve Goals: Fair Progress towards PT goals: Progressing toward goals    Frequency    Min 3X/week      PT Plan Current plan remains appropriate    Co-evaluation              AM-PAC PT "6 Clicks" Mobility   Outcome Measure  Help needed turning from your back to your side while in a flat bed without using bedrails?: A Little Help needed moving from lying on your back to sitting on the side of a flat bed without using bedrails?: A Little Help needed moving to and from a bed to a chair (including a wheelchair)?: A Little Help needed standing up from a chair using your arms (e.g., wheelchair or bedside chair)?: A Little Help needed to walk in hospital room?: A Little Help needed climbing 3-5 steps with a railing? : A Lot 6 Click Score: 17    End of Session Equipment Utilized During Treatment: Oxygen Activity Tolerance: Patient limited by fatigue Patient left: in chair;with call bell/phone within reach Nurse Communication: Mobility status;Other (comment) (needs new purewick) PT Visit Diagnosis: Other abnormalities of gait and mobility (R26.89);Difficulty in walking, not elsewhere classified (R26.2)      Time: 4730-8569 PT Time Calculation (min) (ACUTE ONLY): 21 min  Charges:  $Gait Training: 8-22 mins                      Arby Barrette, PT Pager [phone]    Rexanne Mano 07/19/2020, 11:47 AM

## 2020-07-19 NOTE — Progress Notes (Signed)
CRITICAL VALUE ALERT  Critical Value:  D-dimer 3.8  Date & Time Notied:  07/19/2020 6:01 PM   Provider Notified: Yes  Orders Received/Actions taken: Awaiting orders

## 2020-07-20 ENCOUNTER — Inpatient Hospital Stay (HOSPITAL_COMMUNITY): Payer: Medicare HMO

## 2020-07-20 DIAGNOSIS — N179 Acute kidney failure, unspecified: Secondary | ICD-10-CM | POA: Diagnosis not present

## 2020-07-20 DIAGNOSIS — J9601 Acute respiratory failure with hypoxia: Secondary | ICD-10-CM | POA: Diagnosis not present

## 2020-07-20 DIAGNOSIS — R7881 Bacteremia: Secondary | ICD-10-CM | POA: Diagnosis not present

## 2020-07-20 DIAGNOSIS — E1110 Type 2 diabetes mellitus with ketoacidosis without coma: Secondary | ICD-10-CM | POA: Diagnosis not present

## 2020-07-20 LAB — BASIC METABOLIC PANEL
Anion gap: 10 (ref 5–15)
BUN: 41 mg/dL — ABNORMAL HIGH (ref 8–23)
CO2: 30 mmol/L (ref 22–32)
Calcium: 9.9 mg/dL (ref 8.9–10.3)
Chloride: 103 mmol/L (ref 98–111)
Creatinine, Ser: 1.63 mg/dL — ABNORMAL HIGH (ref 0.44–1.00)
GFR, Estimated: 35 mL/min — ABNORMAL LOW (ref >60)
Glucose, Bld: 138 mg/dL — ABNORMAL HIGH (ref 70–99)
Potassium: 4 mmol/L (ref 3.5–5.1)
Sodium: 143 mmol/L (ref 135–145)

## 2020-07-20 LAB — GLUCOSE, CAPILLARY
Glucose-Capillary: 120 mg/dL — ABNORMAL HIGH (ref 70–99)
Glucose-Capillary: 208 mg/dL — ABNORMAL HIGH (ref 70–99)
Glucose-Capillary: 193 mg/dL — ABNORMAL HIGH (ref 70–99)
Glucose-Capillary: 150 mg/dL — ABNORMAL HIGH (ref 70–99)

## 2020-07-20 MED ORDER — MAGIC MOUTHWASH W/LIDOCAINE
15.0000 mL | Freq: Three times a day (TID) | ORAL | Status: AC
  Administered 2020-07-20 – 2020-07-26 (×19): 15 mL via ORAL
  Filled 2020-07-20 (×21): qty 15

## 2020-07-20 MED ORDER — TECHNETIUM TO 99M ALBUMIN AGGREGATED
3.9000 | Freq: Once | INTRAVENOUS | Status: AC | PRN
  Administered 2020-07-20: 3.9 via INTRAVENOUS

## 2020-07-20 NOTE — Progress Notes (Signed)
PROGRESS NOTE    Annette Gibson  YOV:[id number] DOB: [birth year] DOA: 07/11/2020 PCP: Bridget Hartshorn, NP   Brief Narrative: Annette Gibson is a 65 y.o. female with a history of COPD, diastolic heart failure, diabetes mellitus, type 2, CKD stage IV. Patient presented secondary to dyspnea and lethargy in setting of acute respiratory failure and DKA. She was managed with oxygen via [place], IV insulin.   Assessment & Plan:   Active Problems:   Acute respiratory failure with hypoxia (HCC)   Pneumonia of right lung due to Streptococcus pneumoniae (HCC)   Acute renal failure superimposed on chronic kidney disease ([place])   Diabetic ketoacidosis without coma associated with type 2 diabetes mellitus (HCC)   Severe sepsis (HCC)   Pneumococcal bacteremia   Acute respiratory failure with hypoxia In setting of multifocal pneumonia in addition to underlying COPD. Patient reports no personal history of COVID-19 infection. Patient still requiring significant amounts of oxygen. Transthoracic Echocardiogram significant for RV dilation and decreased function. D-dimer elevated -V/Q scan ordered and still pending  Multifocal pneumonia Likely secondary to strep pneumonia. Urine ag positive and blood culture positive. Started on Ceftriaxone/Azithromycin empirically and transitioned to Ceftriaxone monotherapy. -Continue Ceftriaxone as below  Streptococcus  pneumonia bacteremia Likely secondary to pneumonia. Transthoracic Echocardiogram without evidence of vegetation. Started on ceftriaxone for treatment. -Continue Ceftriaxone IV with plan for 2 week treatment  Severe sepsis Secondary to above problems. Present on admission. Resolved.  COPD Stable -Continue Duoneb and albuterol prn  Pulmonary hypertension Patient managed with IV diuresis  Possible OSA Outpatient sleep study recommended  AKI on CKD stage IV Baseline creatinine of 2.48 from 04/2020. Creatinine of 4.76 on admission.  Improvement with IV fluids in setting of DKA requiring IV insulin. She was subsequently transitioned to Lasix IV and is now on oral lasix. Creatinine stable.  Hypokalemia Hypomagnesemia Repleted  DKA Diabetes mellitus, type 2 Patient initially managed on IV insulin and transitioned to Levemir/SSI -Continue Levemir 12 units BID -Continue SSi  Normocytic anemia Baseline hemoglobin of about 12.1 back in November of 2021. Hemoglobin of 10.8 on admission with downward drift. No obvious evidence of bleeding.  Tongue lesions Unknown etiology. Possibly fungal. -Magic mouthwash   DVT prophylaxis: Heparin subq Code Status:   Code Status: Full Code Family Communication: None at bedside Disposition Plan: Discharge likely in several days pending ability to wean oxygen   Consultants:   PCCM  Procedures:   TRANSTHORACIC ECHOCARDIOGRAM (07/12/2020) IMPRESSIONS    1. Left ventricular ejection fraction, by estimation, is 60 to 65%. The  left ventricle has normal function. The left ventricle has no regional  wall motion abnormalities. Left ventricular diastolic parameters were  normal.  2. Right ventricular systolic function is moderately reduced. The right  ventricular size is moderately enlarged. There is mildly elevated  pulmonary artery systolic pressure.  3. The mitral valve is normal in structure. Trivial mitral valve  regurgitation. No evidence of mitral stenosis.  4. The aortic valve is tricuspid. Aortic valve regurgitation is not  visualized. No aortic stenosis is present.  5. The inferior vena cava is dilated in size with <50% respiratory  variability, suggesting right atrial pressure of 15 mmHg.  Antimicrobials:  Ceftriaxone  Azithromycin    Subjective: Still with mouth soreness. Did not get magic mouthwash last night.  Objective: Vitals:   07/20/20 0358 07/20/20 0514 07/20/20 0801 07/20/20 0910  BP: (!) 118/54  (!) 134/54   Pulse: 72  99   Resp: 20  17  Temp: 98.1 F (36.7 C)  (!) 97.4 F (36.3 C)   TempSrc: Oral  Oral   SpO2:   (!) 88% 92%  Weight:  98 kg    Height:        Intake/Output Summary (Last 24 hours) at 07/20/2020 0928 Last data filed at 07/19/2020 1900 Gross per 24 hour  Intake 505.96 ml  Output 1100 ml  Net -594.04 ml   Filed Weights   07/18/20 0500 07/19/20 0500 07/20/20 0514  Weight: 102.5 kg 102.8 kg 98 kg    Examination:  General exam: Appears calm and comfortable HEENT: tan plaques on tongue Respiratory system: Diminished with mild/faint wheezing. Respiratory effort normal. Cardiovascular system: S1 & S2 heard, RRR. No murmurs, rubs, gallops or clicks. Gastrointestinal system: Abdomen is nondistended, soft and nontender. No organomegaly or masses felt. Normal bowel sounds heard. Central nervous system: Alert and oriented. No focal neurological deficits. Musculoskeletal: No edema. No calf tenderness Skin: No cyanosis. No rashes Psychiatry: Judgement and insight appear normal. Mood & affect appropriate.    Data Reviewed: I have personally reviewed following labs and imaging studies  CBC Lab Results  Component Value Date   WBC 12.4 (H) 07/19/2020   RBC 2.83 (L) 07/19/2020   HGB 8.8 (L) 07/19/2020   HCT 28.2 (L) 07/19/2020   MCV 99.6 07/19/2020   MCH 31.1 07/19/2020   PLT PLATELET CLUMPS NOTED ON SMEAR, UNABLE TO ESTIMATE 07/19/2020   MCHC 31.2 07/19/2020   RDW 13.3 07/19/2020   LYMPHSABS 1.3 07/19/2020   MONOABS 0.4 07/19/2020   EOSABS 0.4 07/19/2020   BASOSABS 0.0 37/90/2409     Last metabolic panel Lab Results  Component Value Date   NA 143 07/20/2020   K 4.0 07/20/2020   CL 103 07/20/2020   CO2 30 07/20/2020   BUN 41 (H) 07/20/2020   CREATININE 1.63 (H) 07/20/2020   GLUCOSE 138 (H) 07/20/2020   GFRNONAA 35 (L) 07/20/2020   GFRAA 68 01/20/2018   CALCIUM 9.9 07/20/2020   PHOS 4.7 (H) 07/17/2020   PROT 5.6 (L) 07/17/2020   ALBUMIN 1.5 (L) 07/17/2020   LABGLOB 2.2 01/20/2018    AGRATIO 2.0 01/20/2018   BILITOT 0.3 07/17/2020   ALKPHOS 121 07/17/2020   AST 21 07/17/2020   ALT 15 07/17/2020   ANIONGAP 10 07/20/2020    CBG (last 3)  Recent Labs    07/19/20 1736 07/19/20 2136 07/20/20 0808  GLUCAP 185* 204* 120*     GFR: Estimated Creatinine Clearance: 35.1 mL/min (A) (by C-G formula based on SCr of 1.63 mg/dL (H)).  Coagulation Profile: No results for input(s): INR, PROTIME in the last 168 hours.  Recent Results (from the past 240 hour(s))  Blood culture (routine x 2)     Status: None   Collection Time: 07/11/20  5:07 PM   Specimen: BLOOD LEFT HAND  Result Value Ref Range Status   Specimen Description BLOOD LEFT HAND  Final   Special Requests   Final    BOTTLES DRAWN AEROBIC AND ANAEROBIC Blood Culture results may not be optimal due to an inadequate volume of blood received in culture bottles   Culture   Final    NO GROWTH 5 DAYS Performed at [facility], [place] [address], [place], [place] [address]    Report Status 07/16/2020 FINAL  Final  Resp Panel by RT-PCR (Flu A&B, Covid) Nasopharyngeal Swab     Status: None   Collection Time: 07/11/20  5:23 PM   Specimen: Nasopharyngeal Swab;  Nasopharyngeal(NP) swabs in vial transport medium  Result Value Ref Range Status   SARS Coronavirus 2 by RT PCR NEGATIVE NEGATIVE Final    Comment: (NOTE) SARS-CoV-2 target nucleic acids are NOT DETECTED.  The SARS-CoV-2 RNA is generally detectable in upper respiratory specimens during the acute phase of infection. The lowest concentration of SARS-CoV-2 viral copies this assay can detect is 138 copies/mL. A negative result does not preclude SARS-Cov-2 infection and should not be used as the sole basis for treatment or other patient management decisions. A negative result may occur with  improper specimen collection/handling, submission of specimen other than nasopharyngeal swab, presence of viral mutation(s) within the areas targeted by this assay, and  inadequate number of viral copies(<138 copies/mL). A negative result must be combined with clinical observations, patient history, and epidemiological information. The expected result is Negative.  Fact Sheet for Patients:  EntrepreneurPulse.com.au  Fact Sheet for Healthcare Providers:  IncredibleEmployment.be  This test is no t yet approved or cleared by the [place] FDA and  has been authorized for detection and/or diagnosis of SARS-CoV-2 by FDA under an Emergency Use Authorization (EUA). This EUA will remain  in effect (meaning this test can be used) for the duration of the COVID-19 declaration under Section 564(b)(1) of the Act, 21 U.S.C.section 360bbb-3(b)(1), unless the authorization is terminated  or revoked sooner.       Influenza A by PCR NEGATIVE NEGATIVE Final   Influenza B by PCR NEGATIVE NEGATIVE Final    Comment: (NOTE) The Xpert Xpress SARS-CoV-2/FLU/RSV plus assay is intended as an aid in the diagnosis of influenza from Nasopharyngeal swab specimens and should not be used as a sole basis for treatment. Nasal washings and aspirates are unacceptable for Xpert Xpress SARS-CoV-2/FLU/RSV testing.  Fact Sheet for Patients: EntrepreneurPulse.com.au  Fact Sheet for Healthcare Providers: IncredibleEmployment.be  This test is not yet approved or cleared by the [place] FDA and has been authorized for detection and/or diagnosis of SARS-CoV-2 by FDA under an Emergency Use Authorization (EUA). This EUA will remain in effect (meaning this test can be used) for the duration of the COVID-19 declaration under Section 564(b)(1) of the Act, 21 U.S.C. section 360bbb-3(b)(1), unless the authorization is terminated or revoked.  Performed at [facility], [place] [address], [place], [place] [address]   Blood culture (routine x 2)     Status: Abnormal   Collection Time: 07/11/20  5:38 PM    Specimen: BLOOD  Result Value Ref Range Status   Specimen Description BLOOD LEFT ANTECUBITAL  Final   Special Requests   Final    BOTTLES DRAWN AEROBIC AND ANAEROBIC Blood Culture adequate volume   Culture  Setup Time   Final    GRAM POSITIVE COCCI IN PAIRS IN CHAINS IN BOTH AEROBIC AND ANAEROBIC BOTTLES CRITICAL RESULT CALLED TO, READ BACK BY AND VERIFIED WITH: Park Breed [id number] FCP Performed at [facility], [place] [address], [place], [place] [address]    Culture STREPTOCOCCUS PNEUMONIAE (A)  Final   Report Status 07/14/2020 FINAL  Final   Organism ID, Bacteria STREPTOCOCCUS PNEUMONIAE  Final      Susceptibility   Streptococcus pneumoniae - MIC*    ERYTHROMYCIN <=0.12 SENSITIVE Sensitive     LEVOFLOXACIN 2 SENSITIVE Sensitive     VANCOMYCIN 0.5 SENSITIVE Sensitive     PENICILLIN (meningitis) <=0.06 SENSITIVE Sensitive     PENO - penicillin <=0.06      PENICILLIN (non-meningitis) <=0.06 SENSITIVE Sensitive  PENICILLIN (oral) <=0.06 SENSITIVE Sensitive     CEFTRIAXONE (non-meningitis) <=0.12 SENSITIVE Sensitive     CEFTRIAXONE (meningitis) <=0.12 SENSITIVE Sensitive     * STREPTOCOCCUS PNEUMONIAE  Blood Culture ID Panel (Reflexed)     Status: Abnormal   Collection Time: 07/11/20  5:38 PM  Result Value Ref Range Status   Enterococcus faecalis NOT DETECTED NOT DETECTED Final   Enterococcus Faecium NOT DETECTED NOT DETECTED Final   Listeria monocytogenes NOT DETECTED NOT DETECTED Final   Staphylococcus species NOT DETECTED NOT DETECTED Final   Staphylococcus aureus (BCID) NOT DETECTED NOT DETECTED Final   Staphylococcus epidermidis NOT DETECTED NOT DETECTED Final   Staphylococcus lugdunensis NOT DETECTED NOT DETECTED Final   Streptococcus species DETECTED (A) NOT DETECTED Final    Comment: CRITICAL RESULT CALLED TO, READ BACK BY AND VERIFIED WITH: PHARMD JEREMY F. [phone] FCP    Streptococcus agalactiae NOT DETECTED NOT DETECTED Final   Streptococcus  pneumoniae DETECTED (A) NOT DETECTED Final    Comment: CRITICAL RESULT CALLED TO, READ BACK BY AND VERIFIED WITH: PHARMD JEREMY F. [phone] FCP    Streptococcus pyogenes NOT DETECTED NOT DETECTED Final   A.calcoaceticus-baumannii NOT DETECTED NOT DETECTED Final   Bacteroides fragilis NOT DETECTED NOT DETECTED Final   Enterobacterales NOT DETECTED NOT DETECTED Final   Enterobacter cloacae complex NOT DETECTED NOT DETECTED Final   Escherichia coli NOT DETECTED NOT DETECTED Final   Klebsiella aerogenes NOT DETECTED NOT DETECTED Final   Klebsiella oxytoca NOT DETECTED NOT DETECTED Final   Klebsiella pneumoniae NOT DETECTED NOT DETECTED Final   Proteus species NOT DETECTED NOT DETECTED Final   Salmonella species NOT DETECTED NOT DETECTED Final   Serratia marcescens NOT DETECTED NOT DETECTED Final   Haemophilus influenzae NOT DETECTED NOT DETECTED Final   Neisseria meningitidis NOT DETECTED NOT DETECTED Final   Pseudomonas aeruginosa NOT DETECTED NOT DETECTED Final   Stenotrophomonas maltophilia NOT DETECTED NOT DETECTED Final   Candida albicans NOT DETECTED NOT DETECTED Final   Candida auris NOT DETECTED NOT DETECTED Final   Candida glabrata NOT DETECTED NOT DETECTED Final   Candida krusei NOT DETECTED NOT DETECTED Final   Candida parapsilosis NOT DETECTED NOT DETECTED Final   Candida tropicalis NOT DETECTED NOT DETECTED Final   Cryptococcus neoformans/gattii NOT DETECTED NOT DETECTED Final    Comment: Performed at [facility], [place], [place], [place] [address]  MRSA PCR Screening     Status: None   Collection Time: 07/12/20  2:34 AM   Specimen: Nasopharyngeal  Result Value Ref Range Status   MRSA by PCR NEGATIVE NEGATIVE Final    Comment:        The GeneXpert MRSA Assay (FDA approved for NASAL specimens only), is one component of a comprehensive MRSA colonization surveillance program. It is not intended to diagnose MRSA infection nor to guide or monitor  treatment for MRSA infections. Performed at [facility], [place] [address], [place], [place] [address]   Urine culture     Status: Abnormal   Collection Time: 07/12/20  6:53 AM   Specimen: In/Out Cath Urine  Result Value Ref Range Status   Specimen Description IN/OUT CATH URINE  Final   Special Requests   Final    NONE Performed at [facility], [place] [address], [place], [place] [address]    Culture MULTIPLE SPECIES PRESENT, SUGGEST RECOLLECTION (A)  Final   Report Status 07/13/2020 FINAL  Final  Radiology Studies: No results found.      Scheduled Meds: . Chlorhexidine Gluconate Cloth  6 each Topical Daily  . furosemide  40 mg Oral Daily  . heparin  5,000 Units Subcutaneous Q8H  . insulin aspart  0-5 Units Subcutaneous QHS  . insulin aspart  0-9 Units Subcutaneous TID WC  . insulin aspart  3 Units Subcutaneous TID WC  . insulin detemir  12 Units Subcutaneous BID  . ipratropium-albuterol  3 mL Nebulization Q6H  . sodium chloride flush  10-40 mL Intracatheter Q12H   Continuous Infusions: . sodium chloride Stopped (07/19/20 1047)  . cefTRIAXone (ROCEPHIN)  IV 2 g (07/19/20 1758)     LOS: 9 days     Cordelia Poche, MD [facility] 07/20/2020, 9:28 AM  If 7PM-7AM, please contact night-coverage [email or web address]

## 2020-07-20 NOTE — Progress Notes (Signed)
   07/20/20 1939  Assess: MEWS Score  Pulse Rate 92  ECG Heart Rate 96  Resp (!) 26  SpO2 96 %  O2 Device HFNC  O2 Flow Rate (L/min) 25 L/min  FiO2 (%) 80 %  Assess: MEWS Score  MEWS Temp 0  MEWS Systolic 0  MEWS Pulse 0  MEWS RR 2  MEWS LOC 0  MEWS Score 2  MEWS Score Color Yellow  Assess: if the MEWS score is Yellow or Red  Were vital signs taken at a resting state? Yes  Focused Assessment No change from prior assessment  Early Detection of Sepsis Score *See Row Information* Medium  MEWS guidelines implemented *See Row Information* Yes  Treat  MEWS Interventions Escalated (See documentation below)  Pain Scale 0-10  Pain Score 0  Take Vital Signs  Increase Vital Sign Frequency  Yellow: Q 2hr X 2 then Q 4hr X 2, if remains yellow, continue Q 4hrs  Escalate  MEWS: Escalate Yellow: discuss with charge nurse/RN and consider discussing with provider and RRT  Notify: Charge Nurse/RN  Name of Charge Nurse/RN Notified Jessica, RN  Date Charge Nurse/RN Notified 07/20/20  Time Charge Nurse/RN Notified 1939  Document  Patient Outcome Other (Comment) (stable. on unit)  Progress note created (see row info) Yes

## 2020-07-20 NOTE — Progress Notes (Signed)
Pt transferred to 2C17. Report given to [facility]. Pt VSS and alert and oriented.

## 2020-07-20 NOTE — Care Management Important Message (Signed)
Important Message  Patient Details  Name: Annette Gibson MRN: [id number] Date of Birth: 30-Jul-1955   Medicare Important Message Given:  Yes     Orbie Pyo 07/20/2020, 2:36 PM

## 2020-07-20 NOTE — Progress Notes (Signed)
Occupational Therapy Treatment Patient Details Name: Annette Gibson. [place] MRN: [id number] DOB: [birth year] Today's Date: 07/20/2020    History of present illness 65 yo admitted 2/8 with SOB and lethargy reporting poor PO intake after getting sick from [facility]. Pt with CAP requiring Bipap. PMhx: COPD, CHF, CKD, DM, anxiety   OT comments  Pt progressing towards acute OT goals. Pulmonary status limiting activity tolerance with EOB/OOB ADLs. Focus of session was LB bathing, pericare and simulated toilet transfer. D/c plan remains appropriate.   Follow Up Recommendations  Home health OT    Equipment Recommendations  3 in 1 bedside commode    Recommendations for Other Services      Precautions / Restrictions Precautions Precautions: Fall Precaution Comments: watch sats, HHFNC Restrictions Weight Bearing Restrictions: No       Mobility Bed Mobility               General bed mobility comments: pt sitting EOB at start of session, recliner at end of session  Transfers Overall transfer level: Needs assistance Equipment used: Rolling walker (2 wheeled) Transfers: Sit to/from Stand Sit to Stand: Min guard         General transfer comment: min guard for safety. From EOB then to/from recliner. cues for hand placement and assit to manage all lines    Balance Overall balance assessment: Needs assistance   Sitting balance-Leahy Scale: Good     Standing balance support: Bilateral upper extremity supported Standing balance-Leahy Scale: Poor Standing balance comment: reliant on single and bil UE support in standing                           ADL either performed or assessed with clinical judgement   ADL Overall ADL's : Needs assistance/impaired                         Toilet Transfer: Min guard;Ambulation;BSC;RW Toilet Transfer Details (indicate cue type and reason): pivotal steps at min guard level Toileting- Clothing Manipulation and Hygiene: Moderate  assistance;Sit to/from stand Toileting - Clothing Manipulation Details (indicate cue type and reason): min guard for transfer, mod A for pericare tasks     Functional mobility during ADLs: Min guard;Rolling walker General ADL Comments: remains on [place]. sats quickly dip into mid low-mid 80s, briefly 73 with OOB activity.     Vision       Perception     Praxis      Cognition Arousal/Alertness: Awake/alert Behavior During Therapy: WFL for tasks assessed/performed Overall Cognitive Status: Within Functional Limits for tasks assessed                                          Exercises Other Exercises Other Exercises: encouraged BUE AROM exercises to toleration   Shoulder Instructions       General Comments      Pertinent Vitals/ Pain       Pain Assessment: Faces Faces Pain Scale: Hurts a little bit Pain Location: R hip/R side flank Pain Descriptors / Indicators: Aching Pain Intervention(s): Repositioned;Monitored during session  Home Living                                          Prior Functioning/Environment  Frequency  Min 2X/week        Progress Toward Goals  OT Goals(current goals can now be found in the care plan section)  Progress towards OT goals: Progressing toward goals  Acute Rehab OT Goals Patient Stated Goal: return home OT Goal Formulation: With patient Time For Goal Achievement: 07/31/20 Potential to Achieve Goals: Good ADL Goals Pt Will Perform Grooming: with modified independence;standing Additional ADL Goal #1: Pt will perform x10 mins of OOB ADL activity with supervivisonA overall for increasing endurance. Additional ADL Goal #2: Pt will state/utilize 3 energy conservation strategies for ADL and mobility in order to increase activity tolerance at home.  Plan Discharge plan remains appropriate    Co-evaluation                 AM-PAC OT "6 Clicks" Daily Activity     Outcome  Measure   Help from another person eating meals?: None Help from another person taking care of personal grooming?: A Little Help from another person toileting, which includes using toliet, bedpan, or urinal?: A Little Help from another person bathing (including washing, rinsing, drying)?: A Little Help from another person to put on and taking off regular upper body clothing?: A Little Help from another person to put on and taking off regular lower body clothing?: A Little 6 Click Score: 19    End of Session Equipment Utilized During Treatment: Rolling walker;Oxygen  OT Visit Diagnosis: Unsteadiness on feet (R26.81);Muscle weakness (generalized) (M62.81)   Activity Tolerance Patient tolerated treatment well;Other (comment) (remains on HHFNC. pulmonary status limiting activity tolerance)   Patient Left in chair;with call bell/phone within reach   Nurse Communication Mobility status        Time: 3710-6269 OT Time Calculation (min): 22 min  Charges: OT General Charges $OT Visit: 1 Visit OT Treatments $Self Care/Home Management : 8-22 mins  Tyrone Schimke, OT Acute Rehabilitation Services Pager: [phone] Office: [phone]    Hortencia Pilar 07/20/2020, 12:37 PM

## 2020-07-21 DIAGNOSIS — E1110 Type 2 diabetes mellitus with ketoacidosis without coma: Secondary | ICD-10-CM | POA: Diagnosis not present

## 2020-07-21 DIAGNOSIS — J9601 Acute respiratory failure with hypoxia: Secondary | ICD-10-CM | POA: Diagnosis not present

## 2020-07-21 DIAGNOSIS — N179 Acute kidney failure, unspecified: Secondary | ICD-10-CM | POA: Diagnosis not present

## 2020-07-21 DIAGNOSIS — R7881 Bacteremia: Secondary | ICD-10-CM | POA: Diagnosis not present

## 2020-07-21 LAB — GLUCOSE, CAPILLARY
Glucose-Capillary: 99 mg/dL (ref 70–99)
Glucose-Capillary: 183 mg/dL — ABNORMAL HIGH (ref 70–99)
Glucose-Capillary: 132 mg/dL — ABNORMAL HIGH (ref 70–99)
Glucose-Capillary: 164 mg/dL — ABNORMAL HIGH (ref 70–99)

## 2020-07-21 LAB — BASIC METABOLIC PANEL
Anion gap: 5 (ref 5–15)
BUN: 32 mg/dL — ABNORMAL HIGH (ref 8–23)
CO2: 33 mmol/L — ABNORMAL HIGH (ref 22–32)
Calcium: 9.7 mg/dL (ref 8.9–10.3)
Chloride: 101 mmol/L (ref 98–111)
Creatinine, Ser: 1.51 mg/dL — ABNORMAL HIGH (ref 0.44–1.00)
GFR, Estimated: 38 mL/min — ABNORMAL LOW (ref >60)
Glucose, Bld: 136 mg/dL — ABNORMAL HIGH (ref 70–99)
Potassium: 4.3 mmol/L (ref 3.5–5.1)
Sodium: 139 mmol/L (ref 135–145)

## 2020-07-21 MED ORDER — INSULIN ASPART 100 UNIT/ML ~~LOC~~ SOLN
5.0000 [IU] | Freq: Three times a day (TID) | SUBCUTANEOUS | Status: DC
  Administered 2020-07-21 – 2020-07-31 (×30): 5 [IU] via SUBCUTANEOUS

## 2020-07-21 MED ORDER — NYSTATIN 100000 UNIT/GM EX POWD
Freq: Three times a day (TID) | CUTANEOUS | Status: DC
  Administered 2020-07-22: 1 via TOPICAL
  Filled 2020-07-21: qty 15

## 2020-07-21 MED ORDER — CYCLOBENZAPRINE HCL 10 MG PO TABS
5.0000 mg | ORAL_TABLET | Freq: Three times a day (TID) | ORAL | Status: DC | PRN
  Administered 2020-07-21: 5 mg via ORAL
  Filled 2020-07-21: qty 1

## 2020-07-21 MED ORDER — IPRATROPIUM-ALBUTEROL 0.5-2.5 (3) MG/3ML IN SOLN
3.0000 mL | Freq: Three times a day (TID) | RESPIRATORY_TRACT | Status: DC
  Administered 2020-07-22 – 2020-07-24 (×9): 3 mL via RESPIRATORY_TRACT
  Filled 2020-07-21 (×9): qty 3

## 2020-07-21 NOTE — Progress Notes (Signed)
Physical Therapy Treatment Patient Details Name: Annette Gibson. Yogi MRN: [id number] DOB: April 04, 1956 Today's Date: 07/21/2020    History of Present Illness 65 yo admitted 2/8 with SOB and lethargy reporting poor PO intake after getting sick from [facility]. Pt with CAP requiring Bipap. PMhx: COPD, CHF, CKD, DM, anxiety    PT Comments    Pt in R sidelying in recliner on entry, reluctantly agreeable to therapy initially however mood picked up once she started moving. Limited in ability by [place] cording. Pt able to ambulate 5 feet forward and back with min A and 1x minor LoB. Able to perform 10 x IS max inhalation 750 mL and perform seated exercise. D/c plans remain appropriate. PT will continue to follow acutely.     Follow Up Recommendations  Home health PT     Equipment Recommendations  None recommended by PT       Precautions / Restrictions Precautions Precautions: Fall Precaution Comments: watch sats, HHFNC Restrictions Weight Bearing Restrictions: No    Mobility  Bed Mobility               General bed mobility comments: in recliner on entry    Transfers Overall transfer level: Needs assistance Equipment used: Rolling walker (2 wheeled) Transfers: Sit to/from Stand Sit to Stand: Min guard         General transfer comment: min guard for safety and management of lines  Ambulation/Gait Ambulation/Gait assistance: Min assist Gait Distance (Feet): 30 Feet (3 bouts of 5' forward 5'back) Assistive device: 1 person hand held assist Gait Pattern/deviations: Decreased stride length;Trunk flexed;Step-through pattern   Gait velocity interpretation: <1.31 ft/sec, indicative of household ambulator General Gait Details: light min A except for 1x LoB requiring heavy min A for steadying       Balance Overall balance assessment: Needs assistance   Sitting balance-Leahy Scale: Good     Standing balance support: Bilateral upper extremity supported Standing balance-Leahy  Scale: Fair Standing balance comment: static stand with no UE support, dynamic standing require single UE support                            Cognition Arousal/Alertness: Awake/alert Behavior During Therapy: WFL for tasks assessed/performed Overall Cognitive Status: Within Functional Limits for tasks assessed                                        Exercises General Exercises - Lower Extremity Long Arc Quad: AROM;Both;10 reps;Seated Hip Flexion/Marching: AROM;Both;10 reps;Seated Toe Raises: AROM;Both;10 reps;Seated Heel Raises: AROM;Both;10 reps;Seated Hand Exercises Opposition: AROM;Both;5 reps Other Exercises Other Exercises: IS x 10 reps max of 750 mL    General Comments General comments (skin integrity, edema, etc.): 20L HHFNC FiO2 80%O2, SaO2 89-94%O2 with activity      Pertinent Vitals/Pain Pain Assessment: No/denies pain           PT Goals (current goals can now be found in the care plan section) Acute Rehab PT Goals Patient Stated Goal: return home PT Goal Formulation: With patient Time For Goal Achievement: 07/30/20 Potential to Achieve Goals: Fair Progress towards PT goals: Progressing toward goals    Frequency    Min 3X/week      PT Plan Current plan remains appropriate       AM-PAC PT "6 Clicks" Mobility   Outcome Measure  Help needed turning from your back  to your side while in a flat bed without using bedrails?: A Little Help needed moving from lying on your back to sitting on the side of a flat bed without using bedrails?: A Little Help needed moving to and from a bed to a chair (including a wheelchair)?: A Little Help needed standing up from a chair using your arms (e.g., wheelchair or bedside chair)?: A Little Help needed to walk in hospital room?: A Little Help needed climbing 3-5 steps with a railing? : A Lot 6 Click Score: 17    End of Session Equipment Utilized During Treatment: Oxygen Activity Tolerance:  Patient limited by fatigue Patient left: in chair;with call bell/phone within reach Nurse Communication: Mobility status;Other (comment) (needs new purewick) PT Visit Diagnosis: Other abnormalities of gait and mobility (R26.89);Difficulty in walking, not elsewhere classified (R26.2)     Time: 5009-3818 PT Time Calculation (min) (ACUTE ONLY): 14 min  Charges:  $Therapeutic Exercise: 8-22 mins                     Elizabeth B. Migdalia Dk PT, DPT Acute Rehabilitation Services Pager [phone] Office [phone]    [place] 07/21/2020, 3:26 PM

## 2020-07-21 NOTE — Progress Notes (Signed)
PROGRESS NOTE    Annette Gibson  PQD:[id number] DOB: February 07, 1956 DOA: 07/11/2020 PCP: Bridget Hartshorn, NP   Brief Narrative: Annette Gibson is a 65 y.o. female with a history of COPD, diastolic heart failure, diabetes mellitus, type 2, CKD stage IV. Patient presented secondary to dyspnea and lethargy in setting of acute respiratory failure and DKA. She was managed with oxygen via [place], IV insulin.   Assessment & Plan:   Active Problems:   Acute respiratory failure with hypoxia (HCC)   Pneumonia of right lung due to Streptococcus pneumoniae (HCC)   Acute renal failure superimposed on chronic kidney disease ([place])   Diabetic ketoacidosis without coma associated with type 2 diabetes mellitus (HCC)   Severe sepsis (HCC)   Pneumococcal bacteremia   Acute respiratory failure with hypoxia In setting of multifocal pneumonia in addition to underlying COPD. Patient reports no personal history of COVID-19 infection. Patient still requiring significant amounts of oxygen. Transthoracic Echocardiogram significant for RV dilation and decreased function. D-dimer elevated -Perfusion scan ordered and still pending; if negative, CT chest w/o contrast  Multifocal pneumonia Likely secondary to strep pneumonia. Urine ag positive and blood culture positive. Started on Ceftriaxone/Azithromycin empirically and transitioned to Ceftriaxone monotherapy. -Continue Ceftriaxone as below  Streptococcus  pneumonia bacteremia Likely secondary to pneumonia. Transthoracic Echocardiogram without evidence of vegetation. Started on ceftriaxone for treatment. -Continue Ceftriaxone IV with plan for 2 week treatment  Severe sepsis Secondary to above problems. Present on admission. Resolved.  Back pain Right flank. Seems related to muscle pain -Heating pad -Flexeril prn  COPD Stable -Continue Duoneb and albuterol prn  Pulmonary hypertension Patient managed with IV diuresis  Possible OSA Outpatient  sleep study recommended  AKI on CKD stage IV Baseline creatinine of 2.48 from 04/2020. Creatinine of 4.76 on admission. Improvement with IV fluids in setting of DKA requiring IV insulin. She was subsequently transitioned to Lasix IV and is now on oral lasix. Creatinine stable.  Hypokalemia Hypomagnesemia Repleted  DKA Diabetes mellitus, type 2 Patient initially managed on IV insulin and transitioned to Levemir/SSI. Fasting blood sugar adequately controlled. Daytime blood sugar not as well controlled. -Continue Levemir 12 units BID -Increase to Novolog 5 units TID with meals -Continue SSi  Normocytic anemia Baseline hemoglobin of about 12.1 back in November of 2021. Hemoglobin of 10.8 on admission with downward drift. No obvious evidence of bleeding.  Tongue lesions Unknown etiology. Possibly fungal. Improving. -Magic mouthwash   DVT prophylaxis: Heparin subq Code Status:   Code Status: Full Code Family Communication: None at bedside Disposition Plan: Discharge likely in several days pending ability to wean oxygen   Consultants:   PCCM  Procedures:   TRANSTHORACIC ECHOCARDIOGRAM (07/12/2020) IMPRESSIONS    1. Left ventricular ejection fraction, by estimation, is 60 to 65%. The  left ventricle has normal function. The left ventricle has no regional  wall motion abnormalities. Left ventricular diastolic parameters were  normal.  2. Right ventricular systolic function is moderately reduced. The right  ventricular size is moderately enlarged. There is mildly elevated  pulmonary artery systolic pressure.  3. The mitral valve is normal in structure. Trivial mitral valve  regurgitation. No evidence of mitral stenosis.  4. The aortic valve is tricuspid. Aortic valve regurgitation is not  visualized. No aortic stenosis is present.  5. The inferior vena cava is dilated in size with <50% respiratory  variability, suggesting right atrial pressure of 15  mmHg.  Antimicrobials:  Ceftriaxone  Azithromycin    Subjective: Mouth  soreness improved.  Objective: Vitals:   07/21/20 0045 07/21/20 0308 07/21/20 0350 07/21/20 0733  BP: 130/67 (!) 142/61    Pulse: 84 (!) 102  (!) 108  Resp: (!) 22 (!) 22  (!) 28  Temp: 98.5 F (36.9 C) 98.6 F (37 C)    TempSrc: Oral Oral    SpO2:  92%  (!) 87%  Weight:   96.8 kg   Height:   _0  (1.473 m)     Intake/Output Summary (Last 24 hours) at 07/21/2020 0944 Last data filed at 07/21/2020 0341 Gross per 24 hour  Intake 450 ml  Output 1860 ml  Net -1410 ml   Filed Weights   07/19/20 0500 07/20/20 0514 07/21/20 0350  Weight: 102.8 kg 98 kg 96.8 kg    Examination:  General exam: Appears calm and comfortable HEENT: tongue plaques appear smaller Respiratory system: Clear to auscultation. Respiratory effort normal. Cardiovascular system: S1 & S2 heard, Tachycardia, normal rhythm. No murmurs, rubs, gallops or clicks. Gastrointestinal system: Abdomen is nondistended, soft and nontender. No organomegaly or masses felt. Normal bowel sounds heard. Central nervous system: Alert and oriented. No focal neurological deficits. Musculoskeletal: No edema. No calf tenderness Skin: No cyanosis. No rashes Psychiatry: Judgement and insight appear normal. Mood & affect appropriate.   Data Reviewed: I have personally reviewed following labs and imaging studies  CBC Lab Results  Component Value Date   WBC 12.4 (H) 07/19/2020   RBC 2.83 (L) 07/19/2020   HGB 8.8 (L) 07/19/2020   HCT 28.2 (L) 07/19/2020   MCV 99.6 07/19/2020   MCH 31.1 07/19/2020   PLT PLATELET CLUMPS NOTED ON SMEAR, UNABLE TO ESTIMATE 07/19/2020   MCHC 31.2 07/19/2020   RDW 13.3 07/19/2020   LYMPHSABS 1.3 07/19/2020   MONOABS 0.4 07/19/2020   EOSABS 0.4 07/19/2020   BASOSABS 0.0 34/19/6222     Last metabolic panel Lab Results  Component Value Date   NA 139 07/21/2020   K 4.3 07/21/2020   CL 101 07/21/2020   CO2 33 (H)  07/21/2020   BUN 32 (H) 07/21/2020   CREATININE 1.51 (H) 07/21/2020   GLUCOSE 136 (H) 07/21/2020   GFRNONAA 38 (L) 07/21/2020   GFRAA 68 01/20/2018   CALCIUM 9.7 07/21/2020   PHOS 4.7 (H) 07/17/2020   PROT 5.6 (L) 07/17/2020   ALBUMIN 1.5 (L) 07/17/2020   LABGLOB 2.2 01/20/2018   AGRATIO 2.0 01/20/2018   BILITOT 0.3 07/17/2020   ALKPHOS 121 07/17/2020   AST 21 07/17/2020   ALT 15 07/17/2020   ANIONGAP 5 07/21/2020    CBG (last 3)  Recent Labs    07/20/20 1603 07/20/20 2116 07/21/20 0622  GLUCAP 193* 150* 99     GFR: Estimated Creatinine Clearance: 37.6 mL/min (A) (by C-G formula based on SCr of 1.51 mg/dL (H)).  Coagulation Profile: No results for input(s): INR, PROTIME in the last 168 hours.  Recent Results (from the past 240 hour(s))  Blood culture (routine x 2)     Status: None   Collection Time: 07/11/20  5:07 PM   Specimen: BLOOD LEFT HAND  Result Value Ref Range Status   Specimen Description BLOOD LEFT HAND  Final   Special Requests   Final    BOTTLES DRAWN AEROBIC AND ANAEROBIC Blood Culture results may not be optimal due to an inadequate volume of blood received in culture bottles   Culture   Final    NO GROWTH 5 DAYS Performed at [facility], [place]  [address] [place], [place] [address]    Report Status 07/16/2020 FINAL  Final  Resp Panel by RT-PCR (Flu A&B, Covid) Nasopharyngeal Swab     Status: None   Collection Time: 07/11/20  5:23 PM   Specimen: Nasopharyngeal Swab; Nasopharyngeal(NP) swabs in vial transport medium  Result Value Ref Range Status   SARS Coronavirus 2 by RT PCR NEGATIVE NEGATIVE Final    Comment: (NOTE) SARS-CoV-2 target nucleic acids are NOT DETECTED.  The SARS-CoV-2 RNA is generally detectable in upper respiratory specimens during the acute phase of infection. The lowest concentration of SARS-CoV-2 viral copies this assay can detect is 138 copies/mL. A negative result does not preclude SARS-Cov-2 infection and should not be  used as the sole basis for treatment or other patient management decisions. A negative result may occur with  improper specimen collection/handling, submission of specimen other than nasopharyngeal swab, presence of viral mutation(s) within the areas targeted by this assay, and inadequate number of viral copies(<138 copies/mL). A negative result must be combined with clinical observations, patient history, and epidemiological information. The expected result is Negative.  Fact Sheet for Patients:  EntrepreneurPulse.com.au  Fact Sheet for Healthcare Providers:  IncredibleEmployment.be  This test is no t yet approved or cleared by the [place] FDA and  has been authorized for detection and/or diagnosis of SARS-CoV-2 by FDA under an Emergency Use Authorization (EUA). This EUA will remain  in effect (meaning this test can be used) for the duration of the COVID-19 declaration under Section 564(b)(1) of the Act, 21 U.S.C.section 360bbb-3(b)(1), unless the authorization is terminated  or revoked sooner.       Influenza A by PCR NEGATIVE NEGATIVE Final   Influenza B by PCR NEGATIVE NEGATIVE Final    Comment: (NOTE) The Xpert Xpress SARS-CoV-2/FLU/RSV plus assay is intended as an aid in the diagnosis of influenza from Nasopharyngeal swab specimens and should not be used as a sole basis for treatment. Nasal washings and aspirates are unacceptable for Xpert Xpress SARS-CoV-2/FLU/RSV testing.  Fact Sheet for Patients: EntrepreneurPulse.com.au  Fact Sheet for Healthcare Providers: IncredibleEmployment.be  This test is not yet approved or cleared by the [place] FDA and has been authorized for detection and/or diagnosis of SARS-CoV-2 by FDA under an Emergency Use Authorization (EUA). This EUA will remain in effect (meaning this test can be used) for the duration of the COVID-19 declaration under Section  564(b)(1) of the Act, 21 U.S.C. section 360bbb-3(b)(1), unless the authorization is terminated or revoked.  Performed at [facility], [place] [address], [place], [place] [address]   Blood culture (routine x 2)     Status: Abnormal   Collection Time: 07/11/20  5:38 PM   Specimen: BLOOD  Result Value Ref Range Status   Specimen Description BLOOD LEFT ANTECUBITAL  Final   Special Requests   Final    BOTTLES DRAWN AEROBIC AND ANAEROBIC Blood Culture adequate volume   Culture  Setup Time   Final    GRAM POSITIVE COCCI IN PAIRS IN CHAINS IN BOTH AEROBIC AND ANAEROBIC BOTTLES CRITICAL RESULT CALLED TO, READ BACK BY AND VERIFIED WITH: Park Breed [id number] FCP Performed at [facility], [place] [address] [place], [place] [address]    Culture STREPTOCOCCUS PNEUMONIAE (A)  Final   Report Status 07/14/2020 FINAL  Final   Organism ID, Bacteria STREPTOCOCCUS PNEUMONIAE  Final      Susceptibility   Streptococcus pneumoniae - MIC*    ERYTHROMYCIN <=0.12 SENSITIVE Sensitive  LEVOFLOXACIN 2 SENSITIVE Sensitive     VANCOMYCIN 0.5 SENSITIVE Sensitive     PENICILLIN (meningitis) <=0.06 SENSITIVE Sensitive     PENO - penicillin <=0.06      PENICILLIN (non-meningitis) <=0.06 SENSITIVE Sensitive     PENICILLIN (oral) <=0.06 SENSITIVE Sensitive     CEFTRIAXONE (non-meningitis) <=0.12 SENSITIVE Sensitive     CEFTRIAXONE (meningitis) <=0.12 SENSITIVE Sensitive     * STREPTOCOCCUS PNEUMONIAE  Blood Culture ID Panel (Reflexed)     Status: Abnormal   Collection Time: 07/11/20  5:38 PM  Result Value Ref Range Status   Enterococcus faecalis NOT DETECTED NOT DETECTED Final   Enterococcus Faecium NOT DETECTED NOT DETECTED Final   Listeria monocytogenes NOT DETECTED NOT DETECTED Final   Staphylococcus species NOT DETECTED NOT DETECTED Final   Staphylococcus aureus (BCID) NOT DETECTED NOT DETECTED Final   Staphylococcus epidermidis NOT DETECTED NOT DETECTED Final   Staphylococcus  lugdunensis NOT DETECTED NOT DETECTED Final   Streptococcus species DETECTED (A) NOT DETECTED Final    Comment: CRITICAL RESULT CALLED TO, READ BACK BY AND VERIFIED WITH: PHARMD JEREMY F. [phone] FCP    Streptococcus agalactiae NOT DETECTED NOT DETECTED Final   Streptococcus pneumoniae DETECTED (A) NOT DETECTED Final    Comment: CRITICAL RESULT CALLED TO, READ BACK BY AND VERIFIED WITH: PHARMD JEREMY F. [phone] [id number] FCP    Streptococcus pyogenes NOT DETECTED NOT DETECTED Final   A.calcoaceticus-baumannii NOT DETECTED NOT DETECTED Final   Bacteroides fragilis NOT DETECTED NOT DETECTED Final   Enterobacterales NOT DETECTED NOT DETECTED Final   Enterobacter cloacae complex NOT DETECTED NOT DETECTED Final   Escherichia coli NOT DETECTED NOT DETECTED Final   Klebsiella aerogenes NOT DETECTED NOT DETECTED Final   Klebsiella oxytoca NOT DETECTED NOT DETECTED Final   Klebsiella pneumoniae NOT DETECTED NOT DETECTED Final   Proteus species NOT DETECTED NOT DETECTED Final   Salmonella species NOT DETECTED NOT DETECTED Final   Serratia marcescens NOT DETECTED NOT DETECTED Final   Haemophilus influenzae NOT DETECTED NOT DETECTED Final   Neisseria meningitidis NOT DETECTED NOT DETECTED Final   Pseudomonas aeruginosa NOT DETECTED NOT DETECTED Final   Stenotrophomonas maltophilia NOT DETECTED NOT DETECTED Final   Candida albicans NOT DETECTED NOT DETECTED Final   Candida auris NOT DETECTED NOT DETECTED Final   Candida glabrata NOT DETECTED NOT DETECTED Final   Candida krusei NOT DETECTED NOT DETECTED Final   Candida parapsilosis NOT DETECTED NOT DETECTED Final   Candida tropicalis NOT DETECTED NOT DETECTED Final   Cryptococcus neoformans/gattii NOT DETECTED NOT DETECTED Final    Comment: Performed at [facility], [address] [place], [place] [address]  MRSA PCR Screening     Status: None   Collection Time: 07/12/20  2:34 AM   Specimen: Nasopharyngeal  Result Value Ref Range Status    MRSA by PCR NEGATIVE NEGATIVE Final    Comment:        The GeneXpert MRSA Assay (FDA approved for NASAL specimens only), is one component of a comprehensive MRSA colonization surveillance program. It is not intended to diagnose MRSA infection nor to guide or monitor treatment for MRSA infections. Performed at [facility], [place] [address] [place], [place] [address]   Urine culture     Status: Abnormal   Collection Time: 07/12/20  6:53 AM   Specimen: In/Out Cath Urine  Result Value Ref Range Status   Specimen Description IN/OUT CATH URINE  Final   Special Requests   Final  NONE Performed at [facility], [place] [address], [place], [place] [address]    Culture MULTIPLE SPECIES PRESENT, SUGGEST RECOLLECTION (A)  Final   Report Status 07/13/2020 FINAL  Final        Radiology Studies: DG CHEST PORT 1 VIEW  Result Date: 07/20/2020 CLINICAL DATA:  Chest pain, cough. EXAM: PORTABLE CHEST 1 VIEW COMPARISON:  July 17, 2020. FINDINGS: The heart size and mediastinal contours are within normal limits. No pneumothorax is noted. Left lung is clear. Right upper and lower lobe airspace opacities are noted concerning for pneumonia. Small right pleural effusion is noted. The visualized skeletal structures are unremarkable. IMPRESSION: Right upper and lower lobe airspace opacities are noted concerning for pneumonia. Small right pleural effusion is noted. Electronically Signed   By: Marijo Conception M.D.   On: 07/20/2020 13:11        Scheduled Meds: . Chlorhexidine Gluconate Cloth  6 each Topical Daily  . furosemide  40 mg Oral Daily  . heparin  5,000 Units Subcutaneous Q8H  . insulin aspart  0-5 Units Subcutaneous QHS  . insulin aspart  0-9 Units Subcutaneous TID WC  . insulin aspart  3 Units Subcutaneous TID WC  . insulin detemir  12 Units Subcutaneous BID  . ipratropium-albuterol  3 mL Nebulization Q6H  . magic mouthwash w/lidocaine  15 mL Oral TID  . sodium chloride  flush  10-40 mL Intracatheter Q12H   Continuous Infusions: . sodium chloride Stopped (07/19/20 1047)  . cefTRIAXone (ROCEPHIN)  IV 2 g (07/20/20 1721)     LOS: 10 days     Cordelia Poche, MD [facility] 07/21/2020, 9:44 AM  If 7PM-7AM, please contact night-coverage [email or web address]

## 2020-07-22 ENCOUNTER — Inpatient Hospital Stay (HOSPITAL_COMMUNITY): Payer: Medicare HMO

## 2020-07-22 DIAGNOSIS — R7881 Bacteremia: Secondary | ICD-10-CM | POA: Diagnosis not present

## 2020-07-22 DIAGNOSIS — J9601 Acute respiratory failure with hypoxia: Secondary | ICD-10-CM | POA: Diagnosis not present

## 2020-07-22 DIAGNOSIS — E1110 Type 2 diabetes mellitus with ketoacidosis without coma: Secondary | ICD-10-CM | POA: Diagnosis not present

## 2020-07-22 DIAGNOSIS — N179 Acute kidney failure, unspecified: Secondary | ICD-10-CM | POA: Diagnosis not present

## 2020-07-22 LAB — GLUCOSE, CAPILLARY
Glucose-Capillary: 113 mg/dL — ABNORMAL HIGH (ref 70–99)
Glucose-Capillary: 172 mg/dL — ABNORMAL HIGH (ref 70–99)
Glucose-Capillary: 113 mg/dL — ABNORMAL HIGH (ref 70–99)
Glucose-Capillary: 162 mg/dL — ABNORMAL HIGH (ref 70–99)

## 2020-07-22 LAB — BASIC METABOLIC PANEL
Anion gap: 7 (ref 5–15)
BUN: 23 mg/dL (ref 8–23)
CO2: 34 mmol/L — ABNORMAL HIGH (ref 22–32)
Calcium: 9.5 mg/dL (ref 8.9–10.3)
Chloride: 99 mmol/L (ref 98–111)
Creatinine, Ser: 1.42 mg/dL — ABNORMAL HIGH (ref 0.44–1.00)
GFR, Estimated: 41 mL/min — ABNORMAL LOW (ref >60)
Glucose, Bld: 131 mg/dL — ABNORMAL HIGH (ref 70–99)
Potassium: 4.3 mmol/L (ref 3.5–5.1)
Sodium: 140 mmol/L (ref 135–145)

## 2020-07-22 LAB — CBC
HCT: 26.3 % — ABNORMAL LOW (ref 36.0–46.0)
Hemoglobin: 7.8 g/dL — ABNORMAL LOW (ref 12.0–15.0)
MCH: 30.1 pg (ref 26.0–34.0)
MCHC: 29.7 g/dL — ABNORMAL LOW (ref 30.0–36.0)
MCV: 101.5 fL — ABNORMAL HIGH (ref 80.0–100.0)
Platelets: 165 10*3/uL (ref 150–400)
RBC: 2.59 MIL/uL — ABNORMAL LOW (ref 3.87–5.11)
RDW: 13 % (ref 11.5–15.5)
WBC: 8.7 10*3/uL (ref 4.0–10.5)
nRBC: 0 % (ref 0.0–0.2)

## 2020-07-22 MED ORDER — SALINE SPRAY 0.65 % NA SOLN
1.0000 | NASAL | Status: DC | PRN
  Administered 2020-07-22 – 2020-07-23 (×2): 1 via NASAL
  Filled 2020-07-22: qty 44

## 2020-07-22 NOTE — Progress Notes (Signed)
PROGRESS NOTE    Annette Gibson  SWN:[id number] DOB: 1955-09-10 DOA: 07/11/2020 PCP: Bridget Hartshorn, NP   Brief Narrative: Annette Gibson is a 65 y.o. female with a history of COPD, diastolic heart failure, diabetes mellitus, type 2, CKD stage IV. Patient presented secondary to dyspnea and lethargy in setting of acute respiratory failure and DKA. She was managed with oxygen via [place], IV insulin.   Assessment & Plan:   Active Problems:   Acute respiratory failure with hypoxia (HCC)   Pneumonia of right lung due to Streptococcus pneumoniae (HCC)   Acute renal failure superimposed on chronic kidney disease ([place])   Diabetic ketoacidosis without coma associated with type 2 diabetes mellitus (HCC)   Severe sepsis (HCC)   Pneumococcal bacteremia   Acute respiratory failure with hypoxia In setting of multifocal pneumonia in addition to underlying COPD. Patient reports no personal history of COVID-19 infection. Patient still requiring significant amounts of oxygen. Transthoracic Echocardiogram significant for RV dilation and decreased function. D-dimer elevated and perfusion scan was negative for PE. -CT chest  Multifocal pneumonia Likely secondary to strep pneumonia. Urine ag positive and blood culture positive. Started on Ceftriaxone/Azithromycin empirically and transitioned to Ceftriaxone monotherapy. -Continue Ceftriaxone as below  Streptococcus  pneumonia bacteremia Likely secondary to pneumonia. Transthoracic Echocardiogram without evidence of vegetation. Started on ceftriaxone for treatment. -Continue Ceftriaxone IV with plan for 2 week treatment  Severe sepsis Secondary to above problems. Present on admission. Resolved.  Back pain Right flank. Seems related to muscle pain -Heating pad -Flexeril prn  COPD Stable -Continue Duoneb and albuterol prn  Pulmonary hypertension Patient managed with IV diuresis  Possible OSA Outpatient sleep study recommended  AKI  on CKD stage IV Baseline creatinine of 2.48 from 04/2020. Creatinine of 4.76 on admission. Improvement with IV fluids in setting of DKA requiring IV insulin. She was subsequently transitioned to Lasix IV and is now on oral lasix. Creatinine stable.  Hypokalemia Hypomagnesemia Repleted  DKA Diabetes mellitus, type 2 Patient initially managed on IV insulin and transitioned to Levemir/SSI. Fasting blood sugar adequately controlled. Daytime blood sugar not as well controlled. -Continue Levemir 12 units BID -Continue Novolog 5 units TID with meals -Continue SSI  Normocytic anemia Baseline hemoglobin of about 12.1 back in November of 2021. Hemoglobin of 10.8 on admission with downward drift. No obvious evidence of bleeding.  Tongue lesions Unknown etiology. Possibly fungal. Improving. -Magic mouthwash   DVT prophylaxis: Heparin subq Code Status:   Code Status: Full Code Family Communication: None at bedside Disposition Plan: Discharge likely in several days pending ability to wean oxygen   Consultants:   PCCM  Procedures:   TRANSTHORACIC ECHOCARDIOGRAM (07/12/2020) IMPRESSIONS    1. Left ventricular ejection fraction, by estimation, is 60 to 65%. The  left ventricle has normal function. The left ventricle has no regional  wall motion abnormalities. Left ventricular diastolic parameters were  normal.  2. Right ventricular systolic function is moderately reduced. The right  ventricular size is moderately enlarged. There is mildly elevated  pulmonary artery systolic pressure.  3. The mitral valve is normal in structure. Trivial mitral valve  regurgitation. No evidence of mitral stenosis.  4. The aortic valve is tricuspid. Aortic valve regurgitation is not  visualized. No aortic stenosis is present.  5. The inferior vena cava is dilated in size with <50% respiratory  variability, suggesting right atrial pressure of 15 mmHg.  Antimicrobials:  Ceftriaxone  Azithromycin     Subjective: No significant issues overnight except  some nasal irritation from [place].  Objective: Vitals:   07/22/20 0300 07/22/20 0327 07/22/20 0713 07/22/20 0741  BP: (!) 118/51  (!) 98/52   Pulse: 83  81   Resp: (!) 22 (!) 21 20   Temp: 97.9 F (36.6 C)  98.3 F (36.8 C)   TempSrc: Oral  Oral   SpO2: 93%  94% 95%  Weight:  105.3 kg    Height:        Intake/Output Summary (Last 24 hours) at 07/22/2020 0915 Last data filed at 07/22/2020 0800 Gross per 24 hour  Intake 240 ml  Output 200 ml  Net 40 ml   Filed Weights   07/20/20 0514 07/21/20 0350 07/22/20 0327  Weight: 98 kg 96.8 kg 105.3 kg    Examination:  General exam: Appears calm and comfortable HEENT: tongue lesions are slightly less Respiratory system: Clear to auscultation. Respiratory effort normal. Cardiovascular system: S1 & S2 heard, RRR. No murmurs, rubs, gallops or clicks. Gastrointestinal system: Abdomen is nondistended, soft and nontender. No organomegaly or masses felt. Normal bowel sounds heard. Central nervous system: Alert and oriented. No focal neurological deficits. Musculoskeletal: No edema. No calf tenderness Skin: No cyanosis. No rashes Psychiatry: Judgement and insight appear normal. Mood & affect appropriate.   Data Reviewed: I have personally reviewed following labs and imaging studies  CBC Lab Results  Component Value Date   WBC 8.7 07/22/2020   RBC 2.59 (L) 07/22/2020   HGB 7.8 (L) 07/22/2020   HCT 26.3 (L) 07/22/2020   MCV 101.5 (H) 07/22/2020   MCH 30.1 07/22/2020   PLT 165 07/22/2020   MCHC 29.7 (L) 07/22/2020   RDW 13.0 07/22/2020   LYMPHSABS 1.3 07/19/2020   MONOABS 0.4 07/19/2020   EOSABS 0.4 07/19/2020   BASOSABS 0.0 95/63/8756     Last metabolic panel Lab Results  Component Value Date   NA 140 07/22/2020   K 4.3 07/22/2020   CL 99 07/22/2020   CO2 34 (H) 07/22/2020   BUN 23 07/22/2020   CREATININE 1.42 (H) 07/22/2020   GLUCOSE 131 (H) 07/22/2020   GFRNONAA  41 (L) 07/22/2020   GFRAA 68 01/20/2018   CALCIUM 9.5 07/22/2020   PHOS 4.7 (H) 07/17/2020   PROT 5.6 (L) 07/17/2020   ALBUMIN 1.5 (L) 07/17/2020   LABGLOB 2.2 01/20/2018   AGRATIO 2.0 01/20/2018   BILITOT 0.3 07/17/2020   ALKPHOS 121 07/17/2020   AST 21 07/17/2020   ALT 15 07/17/2020   ANIONGAP 7 07/22/2020    CBG (last 3)  Recent Labs    07/21/20 1617 07/21/20 2046 07/22/20 0527  GLUCAP 132* 164* 113*     GFR: Estimated Creatinine Clearance: 42.1 mL/min (A) (by C-G formula based on SCr of 1.42 mg/dL (H)).  Coagulation Profile: No results for input(s): INR, PROTIME in the last 168 hours.  No results found for this or any previous visit (from the past 240 hour(s)).      Radiology Studies: NM Pulmonary Perfusion  Result Date: 07/21/2020 CLINICAL DATA:  PE suspected EXAM: NUCLEAR MEDICINE PERFUSION LUNG SCAN TECHNIQUE: Perfusion images were obtained in multiple projections after intravenous injection of radiopharmaceutical. Ventilation scans intentionally deferred if perfusion scan and chest x-ray adequate for interpretation during COVID 19 epidemic. RADIOPHARMACEUTICALS:  3.9 mCi Tc-45mMAA IV COMPARISON:  Same day chest radiograph FINDINGS: There are no suspicious filling defects on perfusion only imaging. IMPRESSION: Very low probability for pulmonary embolism by modified perfusion only PIOPED criteria (PE absent). Electronically Signed   By:  Eddie Candle M.D.   On: 07/21/2020 08:32   DG CHEST PORT 1 VIEW  Result Date: 07/20/2020 CLINICAL DATA:  Chest pain, cough. EXAM: PORTABLE CHEST 1 VIEW COMPARISON:  July 17, 2020. FINDINGS: The heart size and mediastinal contours are within normal limits. No pneumothorax is noted. Left lung is clear. Right upper and lower lobe airspace opacities are noted concerning for pneumonia. Small right pleural effusion is noted. The visualized skeletal structures are unremarkable. IMPRESSION: Right upper and lower lobe airspace opacities are  noted concerning for pneumonia. Small right pleural effusion is noted. Electronically Signed   By: Marijo Conception M.D.   On: 07/20/2020 13:11        Scheduled Meds: . Chlorhexidine Gluconate Cloth  6 each Topical Daily  . furosemide  40 mg Oral Daily  . heparin  5,000 Units Subcutaneous Q8H  . insulin aspart  0-5 Units Subcutaneous QHS  . insulin aspart  0-9 Units Subcutaneous TID WC  . insulin aspart  5 Units Subcutaneous TID WC  . insulin detemir  12 Units Subcutaneous BID  . ipratropium-albuterol  3 mL Nebulization TID  . magic mouthwash w/lidocaine  15 mL Oral TID  . nystatin   Topical TID  . sodium chloride flush  10-40 mL Intracatheter Q12H   Continuous Infusions: . sodium chloride Stopped (07/19/20 1047)  . cefTRIAXone (ROCEPHIN)  IV 2 g (07/21/20 1743)     LOS: 11 days     Cordelia Poche, MD [facility] 07/22/2020, 9:15 AM  If 7PM-7AM, please contact night-coverage [email or web address]

## 2020-07-23 DIAGNOSIS — N179 Acute kidney failure, unspecified: Secondary | ICD-10-CM | POA: Diagnosis not present

## 2020-07-23 DIAGNOSIS — R7881 Bacteremia: Secondary | ICD-10-CM | POA: Diagnosis not present

## 2020-07-23 DIAGNOSIS — J9601 Acute respiratory failure with hypoxia: Secondary | ICD-10-CM | POA: Diagnosis not present

## 2020-07-23 DIAGNOSIS — E1110 Type 2 diabetes mellitus with ketoacidosis without coma: Secondary | ICD-10-CM | POA: Diagnosis not present

## 2020-07-23 LAB — BASIC METABOLIC PANEL
Anion gap: 7 (ref 5–15)
BUN: 21 mg/dL (ref 8–23)
CO2: 36 mmol/L — ABNORMAL HIGH (ref 22–32)
Calcium: 9.2 mg/dL (ref 8.9–10.3)
Chloride: 97 mmol/L — ABNORMAL LOW (ref 98–111)
Creatinine, Ser: 1.4 mg/dL — ABNORMAL HIGH (ref 0.44–1.00)
GFR, Estimated: 42 mL/min — ABNORMAL LOW (ref >60)
Glucose, Bld: 101 mg/dL — ABNORMAL HIGH (ref 70–99)
Potassium: 3.9 mmol/L (ref 3.5–5.1)
Sodium: 140 mmol/L (ref 135–145)

## 2020-07-23 LAB — OCCULT BLOOD X 1 CARD TO LAB, STOOL: Fecal Occult Bld: POSITIVE — AB

## 2020-07-23 LAB — CBC
HCT: 25.5 % — ABNORMAL LOW (ref 36.0–46.0)
Hemoglobin: 7.6 g/dL — ABNORMAL LOW (ref 12.0–15.0)
MCH: 30.5 pg (ref 26.0–34.0)
MCHC: 29.8 g/dL — ABNORMAL LOW (ref 30.0–36.0)
MCV: 102.4 fL — ABNORMAL HIGH (ref 80.0–100.0)
Platelets: 149 10*3/uL — ABNORMAL LOW (ref 150–400)
RBC: 2.49 MIL/uL — ABNORMAL LOW (ref 3.87–5.11)
RDW: 12.9 % (ref 11.5–15.5)
WBC: 8 10*3/uL (ref 4.0–10.5)
nRBC: 0 % (ref 0.0–0.2)

## 2020-07-23 LAB — GLUCOSE, CAPILLARY
Glucose-Capillary: 124 mg/dL — ABNORMAL HIGH (ref 70–99)
Glucose-Capillary: 191 mg/dL — ABNORMAL HIGH (ref 70–99)
Glucose-Capillary: 136 mg/dL — ABNORMAL HIGH (ref 70–99)
Glucose-Capillary: 180 mg/dL — ABNORMAL HIGH (ref 70–99)

## 2020-07-23 NOTE — Progress Notes (Signed)
PROGRESS NOTE    Annette Gibson  VEL:[id number] DOB: May 12, 1956 DOA: 07/11/2020 PCP: Bridget Hartshorn, NP   Brief Narrative: Annette Gibson is a 65 y.o. female with a history of COPD, diastolic heart failure, diabetes mellitus, type 2, CKD stage IV. Patient presented secondary to dyspnea and lethargy in setting of acute respiratory failure and DKA. She was managed with oxygen via , IV insulin.   Assessment & Plan:   Active Problems:   Acute respiratory failure with hypoxia (HCC)   Pneumonia of right lung due to Streptococcus pneumoniae (HCC)   Acute renal failure superimposed on chronic kidney disease ([place])   Diabetic ketoacidosis without coma associated with type 2 diabetes mellitus (HCC)   Severe sepsis (HCC)   Pneumococcal bacteremia   Acute respiratory failure with hypoxia In setting of multifocal pneumonia in addition to underlying COPD. Patient reports no personal history of COVID-19 infection. Patient still requiring significant amounts of oxygen. Transthoracic Echocardiogram significant for RV dilation and decreased function. D-dimer elevated and perfusion scan was negative for PE. CT chest consistent with pneumonia picture. -Wean to room air as able  Multifocal pneumonia Likely secondary to strep pneumonia. Urine ag positive and blood culture positive. Started on Ceftriaxone/Azithromycin empirically and transitioned to Ceftriaxone monotherapy. -Continue Ceftriaxone as below  Streptococcus  pneumonia bacteremia Likely secondary to pneumonia. Transthoracic Echocardiogram without evidence of vegetation. Started on ceftriaxone for treatment. -Continue Ceftriaxone IV with plan for 2 week treatment  Possibly bloody stool On chart review, patient has a history of internal hemorrhoids and diverticulosis on recent colonoscopy in 04/2020. Hemoglobin has been drifting down slowly -FOBT -Trend CBC; transfuse for hemoglobin <7 -If continues to cause issues with anemia,  will consult GI  Severe sepsis Secondary to above problems. Present on admission. Resolved.  Back pain Right flank. Seems related to muscle pain -Heating pad -Flexeril prn  COPD Stable -Continue Duoneb and albuterol prn  Pulmonary hypertension Patient managed with IV diuresis  Possible OSA Outpatient sleep study recommended  AKI on CKD stage IV Baseline creatinine of 2.48 from 04/2020. Creatinine of 4.76 on admission. Improvement with IV fluids in setting of DKA requiring IV insulin. She was subsequently transitioned to Lasix IV and is now on oral lasix. Creatinine stable.  Hypokalemia Hypomagnesemia Repleted  DKA Diabetes mellitus, type 2 Patient initially managed on IV insulin and transitioned to Levemir/SSI. Fasting blood sugar adequately controlled. Daytime blood sugar not as well controlled. -Continue Levemir 12 units BID -Continue Novolog 5 units TID with meals -Continue SSI  Normocytic anemia Baseline hemoglobin of about 12.1 back in November of 2021. Hemoglobin of 10.8 on admission with downward drift. No obvious evidence of bleeding.  Tongue lesions Unknown etiology. Possibly fungal. Improving. -Magic mouthwash  Possible lung nodule 6 mm, right upper lobe. Recommendation for repeat non-contrast CT at 6-12 months.  Thyroid nodule 2.1 cm in diameter with calcification. Recommendation for non-emergent thyroid ultrasound.   DVT prophylaxis: Heparin subq Code Status:   Code Status: Full Code Family Communication: None at bedside Disposition Plan: Discharge likely in several days pending ability to wean oxygen   Consultants:   PCCM  Procedures:   TRANSTHORACIC ECHOCARDIOGRAM (07/12/2020) IMPRESSIONS    1. Left ventricular ejection fraction, by estimation, is 60 to 65%. The  left ventricle has normal function. The left ventricle has no regional  wall motion abnormalities. Left ventricular diastolic parameters were  normal.  2. Right ventricular  systolic function is moderately reduced. The right  ventricular size is moderately  enlarged. There is mildly elevated  pulmonary artery systolic pressure.  3. The mitral valve is normal in structure. Trivial mitral valve  regurgitation. No evidence of mitral stenosis.  4. The aortic valve is tricuspid. Aortic valve regurgitation is not  visualized. No aortic stenosis is present.  5. The inferior vena cava is dilated in size with <50% respiratory  variability, suggesting right atrial pressure of 15 mmHg.  Antimicrobials:  Ceftriaxone  Azithromycin    Subjective: No concerns overnight per patient except that she was told she had some blood in her stool  Objective: Vitals:   07/23/20 0523 07/23/20 0535 07/23/20 0709 07/23/20 0848  BP:   110/85   Pulse:   90 85  Resp:   15 18  Temp:   98.7 F (37.1 C)   TempSrc:   Oral   SpO2: 90% 92% 94% 91%  Weight:  106.3 kg    Height:        Intake/Output Summary (Last 24 hours) at 07/23/2020 1008 Last data filed at 07/23/2020 0740 Gross per 24 hour  Intake 979 ml  Output 1600 ml  Net -621 ml   Filed Weights   07/21/20 0350 07/22/20 0327 07/23/20 0535  Weight: 96.8 kg 105.3 kg 106.3 kg    Examination:  General exam: Appears calm and comfortable Respiratory system: Clear to auscultation. Respiratory effort normal. Cardiovascular system: S1 & S2 heard, RRR. No murmurs, rubs, gallops or clicks. Gastrointestinal system: Abdomen is nondistended, soft and nontender. No organomegaly or masses felt. Normal bowel sounds heard. Central nervous system: Alert and oriented. No focal neurological deficits. Musculoskeletal: No edema. No calf tenderness Skin: No cyanosis. No rashes Psychiatry: Judgement and insight appear normal. Mood & affect appropriate.   Data Reviewed: I have personally reviewed following labs and imaging studies  CBC Lab Results  Component Value Date   WBC 8.0 07/23/2020   RBC 2.49 (L) 07/23/2020   HGB 7.6 (L)  07/23/2020   HCT 25.5 (L) 07/23/2020   MCV 102.4 (H) 07/23/2020   MCH 30.5 07/23/2020   PLT 149 (L) 07/23/2020   MCHC 29.8 (L) 07/23/2020   RDW 12.9 07/23/2020   LYMPHSABS 1.3 07/19/2020   MONOABS 0.4 07/19/2020   EOSABS 0.4 07/19/2020   BASOSABS 0.0 14/43/1540     Last metabolic panel Lab Results  Component Value Date   NA 140 07/23/2020   K 3.9 07/23/2020   CL 97 (L) 07/23/2020   CO2 36 (H) 07/23/2020   BUN 21 07/23/2020   CREATININE 1.40 (H) 07/23/2020   GLUCOSE 101 (H) 07/23/2020   GFRNONAA 42 (L) 07/23/2020   GFRAA 68 01/20/2018   CALCIUM 9.2 07/23/2020   PHOS 4.7 (H) 07/17/2020   PROT 5.6 (L) 07/17/2020   ALBUMIN 1.5 (L) 07/17/2020   LABGLOB 2.2 01/20/2018   AGRATIO 2.0 01/20/2018   BILITOT 0.3 07/17/2020   ALKPHOS 121 07/17/2020   AST 21 07/17/2020   ALT 15 07/17/2020   ANIONGAP 7 07/23/2020    CBG (last 3)  Recent Labs    07/22/20 1541 07/22/20 2111 07/23/20 0635  GLUCAP 113* 162* 124*     GFR: Estimated Creatinine Clearance: 43 mL/min (A) (by C-G formula based on SCr of 1.4 mg/dL (H)).  Coagulation Profile: No results for input(s): INR, PROTIME in the last 168 hours.  No results found for this or any previous visit (from the past 240 hour(s)).      Radiology Studies: CT CHEST WO CONTRAST  Result Date: 07/22/2020 CLINICAL DATA:  Respiratory illness, nondiagnostic x-ray; history chronic kidney disease, CHF, COPD, diabetes mellitus, hypertension, former smoker EXAM: CT CHEST WITHOUT CONTRAST TECHNIQUE: Multidetector CT imaging of the chest was performed following the standard protocol without IV contrast. Sagittal and coronal MPR images reconstructed from axial data set. COMPARISON:  Chest radiograph 07/20/2020 FINDINGS: Cardiovascular: Atherosclerotic calcifications in thoracic aorta and proximal great vessels. RIGHT arm PICC line with tip in SVC. Heart unremarkable. No pericardial effusion. Mediastinum/Nodes: 2.1 cm diameter nodule RIGHT thyroid  lobe containing a calcification; Recommend thyroid US. (Ref: J Am Coll Radiol. 2015 Feb;12(2): 143-50). Scattered normal sized mediastinal lymph nodes. Enlarged precarinal node 15 mm short axis image 23, nonspecific. Mildly enlarged subcarinal node 14 mm short axis image 29. No axillary adenopathy. Lungs/Pleura: Small RIGHT and trace LEFT pleural effusions. Peribronchial thickening. Scattered infiltrates throughout RIGHT upper and RIGHT middle lobes as well as LEFT upper lobe/lingula consistent with pneumonia. Question subpleural nodule posterior RIGHT upper lobe 6 mm image 34. Underlying emphysematous changes in upper lobes. Consolidation in RIGHT lower lobe with scattered subsegmental atelectasis RIGHT lung. Minimal atelectasis LEFT lower lobe. No pneumothorax. Upper Abdomen: Diffuse thickening of adrenal glands bilaterally, slightly more nodular on LEFT. Remaining visualized upper abdomen unremarkable Musculoskeletal: No acute osseous findings. IMPRESSION: Small RIGHT and trace LEFT pleural effusions with scattered atelectasis in the lower lobes. Nonspecific mildly enlarged mediastinal lymph nodes. Scattered pulmonary infiltrates RIGHT upper lobe, RIGHT middle lobe, and LEFT upper lobe/lingula consistent with multifocal pneumonia. Associated peribronchial thickening. 2.1 cm diameter RIGHT thyroid lobe nodule containing a calcification; non emergent follow-up thyroid ultrasound recommended as above. Question subpleural nodule posterior RIGHT upper lobe 6 mm; non-contrast chest CT at 6-12 months is recommended. If the nodule is stable at time of repeat CT, then future CT at 18-24 months (from today's scan) is considered optional for low-risk patients, but is recommended for high-risk patients. This recommendation follows the consensus statement: Guidelines for Management of Incidental Pulmonary Nodules Detected on CT Images: From the Fleischner Society 2017; Radiology 2017; 284:228-243. Aortic Atherosclerosis  (ICD10-I70.0) and Emphysema (ICD10-J43.9). Electronically Signed   By: Lavonia Dana M.D.   On: 07/22/2020 16:58        Scheduled Meds: . Chlorhexidine Gluconate Cloth  6 each Topical Daily  . furosemide  40 mg Oral Daily  . heparin  5,000 Units Subcutaneous Q8H  . insulin aspart  0-5 Units Subcutaneous QHS  . insulin aspart  0-9 Units Subcutaneous TID WC  . insulin aspart  5 Units Subcutaneous TID WC  . insulin detemir  12 Units Subcutaneous BID  . ipratropium-albuterol  3 mL Nebulization TID  . magic mouthwash w/lidocaine  15 mL Oral TID  . nystatin   Topical TID  . sodium chloride flush  10-40 mL Intracatheter Q12H   Continuous Infusions: . sodium chloride Stopped (07/19/20 1047)  . cefTRIAXone (ROCEPHIN)  IV 2 g (07/22/20 1812)     LOS: 12 days     Cordelia Poche, MD [facility] 07/23/2020, 10:08 AM  If 7PM-7AM, please contact night-coverage [email or web address]

## 2020-07-23 NOTE — Progress Notes (Signed)
Pt's FOBT positive, contd heparin sq for now per Dr Lonny Prude if in case HB more drops and more bleeding, Pharmacist aware  Palma Holter, RN

## 2020-07-24 DIAGNOSIS — N179 Acute kidney failure, unspecified: Secondary | ICD-10-CM | POA: Diagnosis not present

## 2020-07-24 DIAGNOSIS — E1110 Type 2 diabetes mellitus with ketoacidosis without coma: Secondary | ICD-10-CM | POA: Diagnosis not present

## 2020-07-24 DIAGNOSIS — J9601 Acute respiratory failure with hypoxia: Secondary | ICD-10-CM | POA: Diagnosis not present

## 2020-07-24 DIAGNOSIS — R7881 Bacteremia: Secondary | ICD-10-CM | POA: Diagnosis not present

## 2020-07-24 LAB — BASIC METABOLIC PANEL
Anion gap: 12 (ref 5–15)
BUN: 18 mg/dL (ref 8–23)
CO2: 31 mmol/L (ref 22–32)
Calcium: 9 mg/dL (ref 8.9–10.3)
Chloride: 97 mmol/L — ABNORMAL LOW (ref 98–111)
Creatinine, Ser: 1.56 mg/dL — ABNORMAL HIGH (ref 0.44–1.00)
GFR, Estimated: 37 mL/min — ABNORMAL LOW (ref >60)
Glucose, Bld: 131 mg/dL — ABNORMAL HIGH (ref 70–99)
Potassium: 3.9 mmol/L (ref 3.5–5.1)
Sodium: 140 mmol/L (ref 135–145)

## 2020-07-24 LAB — CBC
HCT: 23.7 % — ABNORMAL LOW (ref 36.0–46.0)
Hemoglobin: 7.4 g/dL — ABNORMAL LOW (ref 12.0–15.0)
MCH: 31.4 pg (ref 26.0–34.0)
MCHC: 31.2 g/dL (ref 30.0–36.0)
MCV: 100.4 fL — ABNORMAL HIGH (ref 80.0–100.0)
Platelets: 151 10*3/uL (ref 150–400)
RBC: 2.36 MIL/uL — ABNORMAL LOW (ref 3.87–5.11)
RDW: 12.9 % (ref 11.5–15.5)
WBC: 8.5 10*3/uL (ref 4.0–10.5)
nRBC: 0 % (ref 0.0–0.2)

## 2020-07-24 LAB — GLUCOSE, CAPILLARY
Glucose-Capillary: 86 mg/dL (ref 70–99)
Glucose-Capillary: 232 mg/dL — ABNORMAL HIGH (ref 70–99)
Glucose-Capillary: 154 mg/dL — ABNORMAL HIGH (ref 70–99)
Glucose-Capillary: 156 mg/dL — ABNORMAL HIGH (ref 70–99)

## 2020-07-24 NOTE — Progress Notes (Signed)
Physical Therapy Treatment Patient Details Name: Annette Gibson. Kovack MRN: [id number] DOB: Aug 01, 1955 Today's Date: 07/24/2020    History of Present Illness 65 yo admitted 2/8 with SOB and lethargy reporting poor PO intake after getting sick from [facility]. Pt with CAP requiring Bipap. PMhx: COPD, CHF, CKD, DM, anxiety    PT Comments    Pt is making good progress towards her goals, her biggest limitation in addition to increased O2 demand is being tethered to wall with HHFNC. Pt with better understanding of feeling of drop in oxygen saturation and ability to stop activity and practice purse lip breathing to recover. Pt practices with 5 bouts of marching in place and was able to recover from min 80s %O2 to low 90s%O2 within 20 sec of deliberate purse lip breathing. D/c plans remain appropriate at this time. PT will continue to follow acutely.  Follow Up Recommendations  Home health PT     Equipment Recommendations  None recommended by PT    Recommendations for Other Services       Precautions / Restrictions Precautions Precautions: Fall Precaution Comments: watch sats, HHFNC Restrictions Weight Bearing Restrictions: No    Mobility  Bed Mobility Overal bed mobility: Needs Assistance Bed Mobility: Supine to Sit;Sit to Supine     Supine to sit: Supervision Sit to supine: Supervision   General bed mobility comments: supervision for safety    Transfers Overall transfer level: Needs assistance Equipment used: None Transfers: Sit to/from Stand Sit to Stand: Min guard         General transfer comment: min guard for safety and management of lines        Balance Overall balance assessment: Needs assistance   Sitting balance-Leahy Scale: Good     Standing balance support: Bilateral upper extremity supported Standing balance-Leahy Scale: Fair Standing balance comment: static stand with no UE support, dynamic standing require single UE support                             Cognition Arousal/Alertness: Awake/alert Behavior During Therapy: WFL for tasks assessed/performed Overall Cognitive Status: Within Functional Limits for tasks assessed                                        Exercises General Exercises - Lower Extremity Hip Flexion/Marching: AROM;Both;Standing;Other (comment) (5 bouts of 10-15 sec when pt began to feel dizzy/winded)    General Comments General comments (skin integrity, edema, etc.): pt on 20 L O2 via HHFNC FiO2 70%, pt 94%O2 at rest in supine. Dropped to mid 80s with activity however with cues for purse lip breathing pt able to quickly recover to 90-91%O2      Pertinent Vitals/Pain Pain Assessment: No/denies pain           PT Goals (current goals can now be found in the care plan section) Acute Rehab PT Goals Patient Stated Goal: return home PT Goal Formulation: With patient Time For Goal Achievement: 07/30/20 Potential to Achieve Goals: Fair Progress towards PT goals: Progressing toward goals    Frequency    Min 3X/week      PT Plan Current plan remains appropriate       AM-PAC PT "6 Clicks" Mobility   Outcome Measure  Help needed turning from your back to your side while in a flat bed without using bedrails?: A Little Help needed  moving from lying on your back to sitting on the side of a flat bed without using bedrails?: A Little Help needed moving to and from a bed to a chair (including a wheelchair)?: A Little Help needed standing up from a chair using your arms (e.g., wheelchair or bedside chair)?: A Little Help needed to walk in hospital room?: A Little Help needed climbing 3-5 steps with a railing? : A Lot 6 Click Score: 17    End of Session Equipment Utilized During Treatment: Oxygen Activity Tolerance: Patient tolerated treatment well Patient left: in chair;with call bell/phone within reach Nurse Communication: Mobility status PT Visit Diagnosis: Other abnormalities of gait  and mobility (R26.89);Difficulty in walking, not elsewhere classified (R26.2)     Time: 0814-4818 PT Time Calculation (min) (ACUTE ONLY): 17 min  Charges:  $Therapeutic Activity: 8-22 mins                     Iraida Cragin B. Migdalia Dk PT, DPT Acute Rehabilitation Services Pager [phone] Office [phone]    [place] 07/24/2020, 1:16 PM

## 2020-07-24 NOTE — Progress Notes (Signed)
MD made aware of hgb-7.4 with order. Continue to monitor for bleeding.

## 2020-07-24 NOTE — Progress Notes (Signed)
Occupational Therapy Treatment Patient Details Name: Annette Gibson. Rabon MRN: [id number] DOB: Oct 02, 1955 Today's Date: 07/24/2020    History of present illness 65 yo admitted 2/8 with SOB and lethargy reporting poor PO intake after getting sick from [facility]. Pt with CAP requiring Bipap. PMhx: COPD, CHF, CKD, DM, anxiety   OT comments  Pt making steady progress towards OT goals this session. Pt continues to present with decreased activity tolerance, generalized weakness and dyspnea with exertion. Pt on 20L HHFNC FiO2 70% with sats decreasing to as low as 81% with mobility, HR increase to 137 bpm during toilet tasks. Pt with good carryover of pursed lip breathing technique from prior sessions with sats able to quickly rebound. Pt currently requires up to min guard assist for functional transfers with no AD and min guard assist for toileting tasks. Education/ handout provided on energy conservation strategies for home. Pt would continue to benefit from skilled occupational therapy while admitted and after d/c to address the below listed limitations in order to improve overall functional mobility and facilitate independence with BADL participation. DC plan remains appropriate, will follow acutely per POC.     Follow Up Recommendations  Home health OT    Equipment Recommendations  3 in 1 bedside commode    Recommendations for Other Services      Precautions / Restrictions Precautions Precautions: Fall Precaution Comments: watch sats, HHFNC Restrictions Weight Bearing Restrictions: No       Mobility Bed Mobility               General bed mobility comments: pt sitting up in bed in long sitting upon arrival with back unsupported, pt able to transition to EOB with no physical assist, supervision for safety and line mgmt  Transfers Overall transfer level: Needs assistance Equipment used: None Transfers: Sit to/from [facility] Sit to Stand: Min guard Stand pivot  transfers: Min guard       General transfer comment: min guard for safety and management of lines    Balance Overall balance assessment: Needs assistance Sitting-balance support: Feet supported;No upper extremity supported Sitting balance-Leahy Scale: Good Sitting balance - Comments: sitting EOB   Standing balance support: No upper extremity supported;During functional activity Standing balance-Leahy Scale: Fair Standing balance comment: pt able to complete standing pericare with no UE support with no LOB                           ADL either performed or assessed with clinical judgement   ADL Overall ADL's : Needs assistance/impaired     Grooming: Brushing hair;Set up;Sitting       Lower Body Bathing: Min guard;Sit to/from stand Lower Body Bathing Details (indicate cue type and reason): simulated via posterior pericare from [facility]         Toilet Transfer: Min Dietitian Details (indicate cue type and reason): min guard with no AD for pivotal steps from [employer] Toileting- Clothing Manipulation and Hygiene: Min guard;Sit to/from stand Toileting - Clothing Manipulation Details (indicate cue type and reason): minguard for safety and balance with posterior pericare in standing   Tub/Shower Transfer Details (indicate cue type and reason): reports walkin shower at home with seat Functional mobility during ADLs: Min guard;Rolling walker General ADL Comments: pt continues to present with dyspnea with exertion on HHFNC, decreased activity tolerance and generalized deconditioning     Vision Baseline Vision/History: No visual deficits;Wears glasses Wears Glasses: Reading only     Perception  Praxis      Cognition Arousal/Alertness: Awake/alert Behavior During Therapy: WFL for tasks assessed/performed Overall Cognitive Status: Within Functional Limits for tasks assessed                                           Exercises Other Exercises Other Exercises: x10 sit<>stands from recliner with gross supervision   Shoulder Instructions       General Comments pt on 20L HHFNC FiO2 70% with sats decreasing to as low as 81% with mobility, HR increase to 137 bpm during toilet tasks, education and handout provided on energy conservations ( ECS) for home with pt verbalizing understanding and receptive to education. pt reports she lives with her daughter and granddaughter who are there all the times to assist as needed IADLs    Pertinent Vitals/ Pain       Pain Assessment: Faces Faces Pain Scale: Hurts a little bit Pain Location: low back Pain Descriptors / Indicators: Sore Pain Intervention(s): Monitored during session  Home Living                                          Prior Functioning/Environment              Frequency  Min 2X/week        Progress Toward Goals  OT Goals(current goals can now be found in the care plan section)  Progress towards OT goals: Progressing toward goals  Acute Rehab OT Goals Patient Stated Goal: return home OT Goal Formulation: With patient Potential to Achieve Goals: Good  Plan Discharge plan remains appropriate;Frequency remains appropriate    Co-evaluation                 AM-PAC OT "6 Clicks" Daily Activity     Outcome Measure   Help from another person eating meals?: None Help from another person taking care of personal grooming?: None Help from another person toileting, which includes using toliet, bedpan, or urinal?: A Little Help from another person bathing (including washing, rinsing, drying)?: A Little Help from another person to put on and taking off regular upper body clothing?: None Help from another person to put on and taking off regular lower body clothing?: A Little 6 Click Score: 21    End of Session Equipment Utilized During Treatment: Oxygen;Other (comment) (HHFNC 20L FiO2 70%)  OT Visit Diagnosis:  Unsteadiness on feet (R26.81);Muscle weakness (generalized) (M62.81);Pain Pain - part of body:  (low back)   Activity Tolerance Patient tolerated treatment well   Patient Left in chair;with call bell/phone within reach   Nurse Communication Mobility status        Time: 1901-2224 OT Time Calculation (min): 31 min  Charges: OT General Charges $OT Visit: 1 Visit OT Treatments $Self Care/Home Management : 23-37 mins  Harley Alto., COTA/L Acute Rehabilitation Services [place]    Precious Haws 07/24/2020, 8:51 AM

## 2020-07-24 NOTE — Progress Notes (Signed)
PROGRESS NOTE    Annette Gibson  AGT:[id number] DOB: June 10, 1955 DOA: 07/11/2020 PCP: Bridget Hartshorn, NP   Brief Narrative: Annette Gibson is a 65 y.o. female with a history of COPD, diastolic heart failure, diabetes mellitus, type 2, CKD stage IV. Patient presented secondary to dyspnea and lethargy in setting of acute respiratory failure and DKA. She was managed with oxygen via [place], IV insulin.   Assessment & Plan:   Active Problems:   Acute respiratory failure with hypoxia (HCC)   Pneumonia of right lung due to Streptococcus pneumoniae (HCC)   Acute renal failure superimposed on chronic kidney disease ([place])   Diabetic ketoacidosis without coma associated with type 2 diabetes mellitus (HCC)   Severe sepsis (HCC)   Pneumococcal bacteremia   Acute respiratory failure with hypoxia In setting of multifocal pneumonia in addition to underlying COPD. Patient reports no personal history of COVID-19 infection. Patient still requiring significant amounts of oxygen. Transthoracic Echocardiogram significant for RV dilation and decreased function. D-dimer elevated and perfusion scan was negative for PE. CT chest consistent with pneumonia picture. -Wean to room air as able  Multifocal pneumonia Likely secondary to strep pneumonia. Urine ag positive and blood culture positive. Started on Ceftriaxone/Azithromycin empirically and transitioned to Ceftriaxone monotherapy. -Continue Ceftriaxone as below  Streptococcus  pneumonia bacteremia Likely secondary to pneumonia. Transthoracic Echocardiogram without evidence of vegetation. Started on ceftriaxone for treatment. -Continue Ceftriaxone IV with plan for 2 week treatment. Today is the last day of treatment. Discontinue Ceftriaxone on 2/22  FOBT stool On chart review, patient has a history of internal hemorrhoids and diverticulosis on recent colonoscopy in 04/2020. Hemoglobin has been drifting down slowly -Trend CBC; transfuse for  hemoglobin <7  Severe sepsis Secondary to above problems. Present on admission. Resolved.  Back pain Right flank. Seems related to muscle pain -Heating pad -Flexeril prn  COPD Stable -Continue Duoneb and albuterol prn  Pulmonary hypertension Patient managed with IV diuresis  Possible OSA Outpatient sleep study recommended  AKI on CKD stage IV Baseline creatinine of 2.48 from 04/2020. Creatinine of 4.76 on admission. Improvement with IV fluids in setting of DKA requiring IV insulin. She was subsequently transitioned to Lasix IV and is now on oral lasix. Creatinine stable.  Hypokalemia Hypomagnesemia Repleted  DKA Diabetes mellitus, type 2 Patient initially managed on IV insulin and transitioned to Levemir/SSI. Fasting blood sugar adequately controlled. Daytime blood sugar not as well controlled. -Continue Levemir 12 units BID -Continue Novolog 5 units TID with meals -Continue SSI  Normocytic anemia Acute blood loss anemia Baseline hemoglobin of about 12.1 back in November of 2021. Hemoglobin of 10.8 on admission with downward drift. Likely GI bleeding from diverticulosis vs hemorrhoids. Complicated by DVT prophylaxis -Discontinue heparin subq -Trend CBC  Tongue lesions Unknown etiology. Possibly fungal. Improved. -Magic mouthwash  Possible lung nodule 6 mm, right upper lobe. Recommendation for repeat non-contrast CT at 6-12 months.  Thyroid nodule 2.1 cm in diameter with calcification. Recommendation for non-emergent thyroid ultrasound.   DVT prophylaxis: SCDs secondary to GI bleeding Code Status:   Code Status: Full Code Family Communication: None at bedside Disposition Plan: Discharge likely in several days pending ability to wean oxygen   Consultants:   PCCM  Procedures:   TRANSTHORACIC ECHOCARDIOGRAM (07/12/2020) IMPRESSIONS    1. Left ventricular ejection fraction, by estimation, is 60 to 65%. The  left ventricle has normal function. The left  ventricle has no regional  wall motion abnormalities. Left ventricular diastolic parameters were  normal.  2. Right ventricular systolic function is moderately reduced. The right  ventricular size is moderately enlarged. There is mildly elevated  pulmonary artery systolic pressure.  3. The mitral valve is normal in structure. Trivial mitral valve  regurgitation. No evidence of mitral stenosis.  4. The aortic valve is tricuspid. Aortic valve regurgitation is not  visualized. No aortic stenosis is present.  5. The inferior vena cava is dilated in size with <50% respiratory  variability, suggesting right atrial pressure of 15 mmHg.  Antimicrobials:  Ceftriaxone  Azithromycin    Subjective: No issues overnight. Tongue lesions significantly improved. No dyspnea or chest pain.  Objective: Vitals:   07/24/20 0435 07/24/20 0500 07/24/20 0728 07/24/20 0731  BP: (!) 124/48     Pulse: 82   82  Resp: 18   (!) 21  Temp: 98.2 F (36.8 C)     TempSrc: Oral     SpO2: 98%  97% 96%  Weight:  96.8 kg    Height:        Intake/Output Summary (Last 24 hours) at 07/24/2020 0837 Last data filed at 07/24/2020 0093 Gross per 24 hour  Intake 600 ml  Output 1750 ml  Net -1150 ml   Filed Weights   07/22/20 0327 07/23/20 0535 07/24/20 0500  Weight: 105.3 kg 106.3 kg 96.8 kg    Examination:  General exam: Appears calm and comfortable Respiratory system: Rales and mild wheezing. Respiratory effort normal. Cardiovascular system: S1 & S2 heard, RRR. No murmurs, rubs, gallops or clicks. Gastrointestinal system: Abdomen is nondistended, soft and nontender. No organomegaly or masses felt. Normal bowel sounds heard. Central nervous system: Alert and oriented. No focal neurological deficits. Musculoskeletal: No calf tenderness Skin: No cyanosis. No rashes Psychiatry: Judgement and insight appear normal. Mood & affect appropriate.   Data Reviewed: I have personally reviewed following labs and  imaging studies  CBC Lab Results  Component Value Date   WBC 8.5 07/24/2020   RBC 2.36 (L) 07/24/2020   HGB 7.4 (L) 07/24/2020   HCT 23.7 (L) 07/24/2020   MCV 100.4 (H) 07/24/2020   MCH 31.4 07/24/2020   PLT 151 07/24/2020   MCHC 31.2 07/24/2020   RDW 12.9 07/24/2020   LYMPHSABS 1.3 07/19/2020   MONOABS 0.4 07/19/2020   EOSABS 0.4 07/19/2020   BASOSABS 0.0 81/82/9937     Last metabolic panel Lab Results  Component Value Date   NA 140 07/24/2020   K 3.9 07/24/2020   CL 97 (L) 07/24/2020   CO2 31 07/24/2020   BUN 18 07/24/2020   CREATININE 1.56 (H) 07/24/2020   GLUCOSE 131 (H) 07/24/2020   GFRNONAA 37 (L) 07/24/2020   GFRAA 68 01/20/2018   CALCIUM 9.0 07/24/2020   PHOS 4.7 (H) 07/17/2020   PROT 5.6 (L) 07/17/2020   ALBUMIN 1.5 (L) 07/17/2020   LABGLOB 2.2 01/20/2018   AGRATIO 2.0 01/20/2018   BILITOT 0.3 07/17/2020   ALKPHOS 121 07/17/2020   AST 21 07/17/2020   ALT 15 07/17/2020   ANIONGAP 12 07/24/2020    CBG (last 3)  Recent Labs    07/23/20 1536 07/23/20 2111 07/24/20 0614  GLUCAP 136* 180* 86     GFR: Estimated Creatinine Clearance: 36.4 mL/min (A) (by C-G formula based on SCr of 1.56 mg/dL (H)).  Coagulation Profile: No results for input(s): INR, PROTIME in the last 168 hours.  No results found for this or any previous visit (from the past 240 hour(s)).      Radiology Studies: CT  CHEST WO CONTRAST  Result Date: 07/22/2020 CLINICAL DATA:  Respiratory illness, nondiagnostic x-ray; history chronic kidney disease, CHF, COPD, diabetes mellitus, hypertension, former smoker EXAM: CT CHEST WITHOUT CONTRAST TECHNIQUE: Multidetector CT imaging of the chest was performed following the standard protocol without IV contrast. Sagittal and coronal MPR images reconstructed from axial data set. COMPARISON:  Chest radiograph 07/20/2020 FINDINGS: Cardiovascular: Atherosclerotic calcifications in thoracic aorta and proximal great vessels. RIGHT arm PICC line with  tip in SVC. Heart unremarkable. No pericardial effusion. Mediastinum/Nodes: 2.1 cm diameter nodule RIGHT thyroid lobe containing a calcification; Recommend thyroid US. (Ref: J Am Coll Radiol. 2015 Feb;12(2): 143-50). Scattered normal sized mediastinal lymph nodes. Enlarged precarinal node 15 mm short axis image 23, nonspecific. Mildly enlarged subcarinal node 14 mm short axis image 29. No axillary adenopathy. Lungs/Pleura: Small RIGHT and trace LEFT pleural effusions. Peribronchial thickening. Scattered infiltrates throughout RIGHT upper and RIGHT middle lobes as well as LEFT upper lobe/lingula consistent with pneumonia. Question subpleural nodule posterior RIGHT upper lobe 6 mm image 34. Underlying emphysematous changes in upper lobes. Consolidation in RIGHT lower lobe with scattered subsegmental atelectasis RIGHT lung. Minimal atelectasis LEFT lower lobe. No pneumothorax. Upper Abdomen: Diffuse thickening of adrenal glands bilaterally, slightly more nodular on LEFT. Remaining visualized upper abdomen unremarkable Musculoskeletal: No acute osseous findings. IMPRESSION: Small RIGHT and trace LEFT pleural effusions with scattered atelectasis in the lower lobes. Nonspecific mildly enlarged mediastinal lymph nodes. Scattered pulmonary infiltrates RIGHT upper lobe, RIGHT middle lobe, and LEFT upper lobe/lingula consistent with multifocal pneumonia. Associated peribronchial thickening. 2.1 cm diameter RIGHT thyroid lobe nodule containing a calcification; non emergent follow-up thyroid ultrasound recommended as above. Question subpleural nodule posterior RIGHT upper lobe 6 mm; non-contrast chest CT at 6-12 months is recommended. If the nodule is stable at time of repeat CT, then future CT at 18-24 months (from today's scan) is considered optional for low-risk patients, but is recommended for high-risk patients. This recommendation follows the consensus statement: Guidelines for Management of Incidental Pulmonary Nodules  Detected on CT Images: From the Fleischner Society 2017; Radiology 2017; 284:228-243. Aortic Atherosclerosis (ICD10-I70.0) and Emphysema (ICD10-J43.9). Electronically Signed   By: Lavonia Dana M.D.   On: 07/22/2020 16:58        Scheduled Meds: . Chlorhexidine Gluconate Cloth  6 each Topical Daily  . furosemide  40 mg Oral Daily  . insulin aspart  0-5 Units Subcutaneous QHS  . insulin aspart  0-9 Units Subcutaneous TID WC  . insulin aspart  5 Units Subcutaneous TID WC  . insulin detemir  12 Units Subcutaneous BID  . ipratropium-albuterol  3 mL Nebulization TID  . magic mouthwash w/lidocaine  15 mL Oral TID  . nystatin   Topical TID  . sodium chloride flush  10-40 mL Intracatheter Q12H   Continuous Infusions: . sodium chloride Stopped (07/19/20 1047)  . cefTRIAXone (ROCEPHIN)  IV 2 g (07/23/20 1724)     LOS: 13 days     Cordelia Poche, MD [facility] 07/24/2020, 8:37 AM  If 7PM-7AM, please contact night-coverage [email or web address]

## 2020-07-25 DIAGNOSIS — R7881 Bacteremia: Secondary | ICD-10-CM | POA: Diagnosis not present

## 2020-07-25 DIAGNOSIS — E1110 Type 2 diabetes mellitus with ketoacidosis without coma: Secondary | ICD-10-CM | POA: Diagnosis not present

## 2020-07-25 DIAGNOSIS — N179 Acute kidney failure, unspecified: Secondary | ICD-10-CM | POA: Diagnosis not present

## 2020-07-25 DIAGNOSIS — J9601 Acute respiratory failure with hypoxia: Secondary | ICD-10-CM | POA: Diagnosis not present

## 2020-07-25 LAB — GLUCOSE, CAPILLARY
Glucose-Capillary: 145 mg/dL — ABNORMAL HIGH (ref 70–99)
Glucose-Capillary: 161 mg/dL — ABNORMAL HIGH (ref 70–99)
Glucose-Capillary: 224 mg/dL — ABNORMAL HIGH (ref 70–99)
Glucose-Capillary: 119 mg/dL — ABNORMAL HIGH (ref 70–99)

## 2020-07-25 LAB — BASIC METABOLIC PANEL
Anion gap: 8 (ref 5–15)
BUN: 15 mg/dL (ref 8–23)
CO2: 35 mmol/L — ABNORMAL HIGH (ref 22–32)
Calcium: 9.2 mg/dL (ref 8.9–10.3)
Chloride: 98 mmol/L (ref 98–111)
Creatinine, Ser: 1.4 mg/dL — ABNORMAL HIGH (ref 0.44–1.00)
GFR, Estimated: 42 mL/min — ABNORMAL LOW (ref >60)
Glucose, Bld: 141 mg/dL — ABNORMAL HIGH (ref 70–99)
Potassium: 4.3 mmol/L (ref 3.5–5.1)
Sodium: 141 mmol/L (ref 135–145)

## 2020-07-25 LAB — CBC
HCT: 24 % — ABNORMAL LOW (ref 36.0–46.0)
Hemoglobin: 7.5 g/dL — ABNORMAL LOW (ref 12.0–15.0)
MCH: 31.3 pg (ref 26.0–34.0)
MCHC: 31.3 g/dL (ref 30.0–36.0)
MCV: 100 fL (ref 80.0–100.0)
Platelets: 139 10*3/uL — ABNORMAL LOW (ref 150–400)
RBC: 2.4 MIL/uL — ABNORMAL LOW (ref 3.87–5.11)
RDW: 12.7 % (ref 11.5–15.5)
WBC: 7.1 10*3/uL (ref 4.0–10.5)
nRBC: 0 % (ref 0.0–0.2)

## 2020-07-25 MED ORDER — IPRATROPIUM-ALBUTEROL 0.5-2.5 (3) MG/3ML IN SOLN
3.0000 mL | Freq: Two times a day (BID) | RESPIRATORY_TRACT | Status: DC
  Administered 2020-07-25 – 2020-07-29 (×9): 3 mL via RESPIRATORY_TRACT
  Filled 2020-07-25 (×9): qty 3

## 2020-07-25 NOTE — Progress Notes (Signed)
PROGRESS NOTE    Annette Gibson  YEB:[id number] DOB: Feb 09, 1956 DOA: 07/11/2020 PCP: Bridget Hartshorn, NP   Brief Narrative: Annette Gibson is a 65 y.o. female with a history of COPD, diastolic heart failure, diabetes mellitus, type 2, CKD stage IV. Patient presented secondary to dyspnea and lethargy in setting of acute respiratory failure from multifocal pneumonia and DKA. Complicated by strep bacteremia. She was managed with oxygen via [place], IV insulin. She has completed antibiotics.   Assessment & Plan:   Active Problems:   Acute respiratory failure with hypoxia (HCC)   Pneumonia of right lung due to Streptococcus pneumoniae (HCC)   Acute renal failure superimposed on chronic kidney disease ([place])   Diabetic ketoacidosis without coma associated with type 2 diabetes mellitus (HCC)   Severe sepsis (HCC)   Pneumococcal bacteremia   Acute respiratory failure with hypoxia In setting of multifocal pneumonia in addition to underlying COPD. Patient reports no personal history of COVID-19 infection. Patient still requiring significant amounts of oxygen. Transthoracic Echocardiogram significant for RV dilation and decreased function. D-dimer elevated and perfusion scan was negative for PE. CT chest consistent with pneumonia picture. -Wean to room air as able  Multifocal pneumonia Likely secondary to strep pneumonia. Urine ag positive and blood culture positive. Started on Ceftriaxone/Azithromycin empirically and transitioned to Ceftriaxone monotherapy.  Streptococcus  pneumonia bacteremia Likely secondary to pneumonia. Transthoracic Echocardiogram without evidence of vegetation. Started on ceftriaxone for treatment. Completed Ceftriaxone course on 2/21.  FOBT stool On chart review, patient has a history of internal hemorrhoids and diverticulosis on recent colonoscopy in 04/2020. Hemoglobin has been drifting down slowly -Trend CBC; transfuse for hemoglobin <7  Severe  sepsis Secondary to above problems. Present on admission. Resolved.  Back pain Right flank. Seems related to muscle pain -Heating pad -Flexeril prn  COPD Stable -Continue Duoneb and albuterol prn  Pulmonary hypertension Patient managed with IV diuresis  Thrombocytopenia Mild -Daily CBC  Possible OSA Outpatient sleep study recommended  AKI on CKD stage IV Baseline creatinine of 2.48 from 04/2020. Creatinine of 4.76 on admission. Improvement with IV fluids in setting of DKA requiring IV insulin. She was subsequently transitioned to Lasix IV and is now on oral lasix. Creatinine stable.  Hypokalemia Hypomagnesemia Repleted  DKA Diabetes mellitus, type 2 Patient initially managed on IV insulin and transitioned to Levemir/SSI. Fasting blood sugar adequately controlled. Daytime blood sugar not as well controlled. -Continue Levemir 12 units BID -Continue Novolog 5 units TID with meals -Continue SSI  Normocytic anemia Acute blood loss anemia Baseline hemoglobin of about 12.1 back in November of 2021. Hemoglobin of 10.8 on admission with downward drift. Likely GI bleeding from diverticulosis vs hemorrhoids. Complicated by DVT prophylaxis which was discontinued on 2/22. -Trend CBC  Tongue lesions Unknown etiology. Possibly fungal. Improved with magic moutwash -Magic mouthwash x7 days  Possible lung nodule 6 mm, right upper lobe. Recommendation for repeat non-contrast CT at 6-12 months.  Thyroid nodule 2.1 cm in diameter with calcification. Recommendation for non-emergent thyroid ultrasound.   DVT prophylaxis: SCDs secondary to GI bleeding Code Status:   Code Status: Full Code Family Communication: None at bedside Disposition Plan: Discharge likely in several days pending ability to wean oxygen   Consultants:   PCCM  Procedures:   TRANSTHORACIC ECHOCARDIOGRAM (07/12/2020) IMPRESSIONS    1. Left ventricular ejection fraction, by estimation, is 60 to 65%. The   left ventricle has normal function. The left ventricle has no regional  wall motion abnormalities. Left ventricular  diastolic parameters were  normal.  2. Right ventricular systolic function is moderately reduced. The right  ventricular size is moderately enlarged. There is mildly elevated  pulmonary artery systolic pressure.  3. The mitral valve is normal in structure. Trivial mitral valve  regurgitation. No evidence of mitral stenosis.  4. The aortic valve is tricuspid. Aortic valve regurgitation is not  visualized. No aortic stenosis is present.  5. The inferior vena cava is dilated in size with <50% respiratory  variability, suggesting right atrial pressure of 15 mmHg.  Antimicrobials:  Ceftriaxone  Azithromycin    Subjective: No dyspnea at rest. Some hypoxia with ambulation.  Objective: Vitals:   07/25/20 0352 07/25/20 0442 07/25/20 0723 07/25/20 0724  BP: (!) 115/55     Pulse: 78   92  Resp: 20   (!) 24  Temp: 97.8 F (36.6 C)     TempSrc: Oral     SpO2: 91%  92% 92%  Weight:  95.5 kg    Height:        Intake/Output Summary (Last 24 hours) at 07/25/2020 1122 Last data filed at 07/25/2020 0757 Gross per 24 hour  Intake 796 ml  Output 1050 ml  Net -254 ml   Filed Weights   07/23/20 0535 07/24/20 0500 07/25/20 0442  Weight: 106.3 kg 96.8 kg 95.5 kg    Examination:  General exam: Appears calm and comfortable Respiratory system: Mild rales. Respiratory effort normal. Cardiovascular system: S1 & S2 heard, RRR. No murmurs, rubs, gallops or clicks. Gastrointestinal system: Abdomen is nondistended, soft and nontender. No organomegaly or masses felt. Normal bowel sounds heard. Central nervous system: Alert and oriented. No focal neurological deficits. Musculoskeletal: No edema. No calf tenderness Skin: No cyanosis. No rashes Psychiatry: Judgement and insight appear normal. Mood & affect appropriate.   Data Reviewed: I have personally reviewed following labs  and imaging studies  CBC Lab Results  Component Value Date   WBC 7.1 07/25/2020   RBC 2.40 (L) 07/25/2020   HGB 7.5 (L) 07/25/2020   HCT 24.0 (L) 07/25/2020   MCV 100.0 07/25/2020   MCH 31.3 07/25/2020   PLT 139 (L) 07/25/2020   MCHC 31.3 07/25/2020   RDW 12.7 07/25/2020   LYMPHSABS 1.3 07/19/2020   MONOABS 0.4 07/19/2020   EOSABS 0.4 07/19/2020   BASOSABS 0.0 21/30/8657     Last metabolic panel Lab Results  Component Value Date   NA 141 07/25/2020   K 4.3 07/25/2020   CL 98 07/25/2020   CO2 35 (H) 07/25/2020   BUN 15 07/25/2020   CREATININE 1.40 (H) 07/25/2020   GLUCOSE 141 (H) 07/25/2020   GFRNONAA 42 (L) 07/25/2020   GFRAA 68 01/20/2018   CALCIUM 9.2 07/25/2020   PHOS 4.7 (H) 07/17/2020   PROT 5.6 (L) 07/17/2020   ALBUMIN 1.5 (L) 07/17/2020   LABGLOB 2.2 01/20/2018   AGRATIO 2.0 01/20/2018   BILITOT 0.3 07/17/2020   ALKPHOS 121 07/17/2020   AST 21 07/17/2020   ALT 15 07/17/2020   ANIONGAP 8 07/25/2020    CBG (last 3)  Recent Labs    07/24/20 1721 07/24/20 2119 07/25/20 0604  GLUCAP 154* 156* 145*     GFR: Estimated Creatinine Clearance: 40.2 mL/min (A) (by C-G formula based on SCr of 1.4 mg/dL (H)).  Coagulation Profile: No results for input(s): INR, PROTIME in the last 168 hours.  No results found for this or any previous visit (from the past 240 hour(s)).      Radiology Studies: No  results found.      Scheduled Meds: . Chlorhexidine Gluconate Cloth  6 each Topical Daily  . furosemide  40 mg Oral Daily  . insulin aspart  0-5 Units Subcutaneous QHS  . insulin aspart  0-9 Units Subcutaneous TID WC  . insulin aspart  5 Units Subcutaneous TID WC  . insulin detemir  12 Units Subcutaneous BID  . ipratropium-albuterol  3 mL Nebulization BID  . magic mouthwash w/lidocaine  15 mL Oral TID  . nystatin   Topical TID  . sodium chloride flush  10-40 mL Intracatheter Q12H   Continuous Infusions: . sodium chloride Stopped (07/19/20 1047)      LOS: 14 days     Cordelia Poche, MD [facility] 07/25/2020, 11:22 AM  If 7PM-7AM, please contact night-coverage [email or web address]

## 2020-07-26 DIAGNOSIS — N184 Chronic kidney disease, stage 4 (severe): Secondary | ICD-10-CM | POA: Diagnosis not present

## 2020-07-26 DIAGNOSIS — N179 Acute kidney failure, unspecified: Secondary | ICD-10-CM | POA: Diagnosis not present

## 2020-07-26 LAB — BASIC METABOLIC PANEL
Anion gap: 8 (ref 5–15)
BUN: 13 mg/dL (ref 8–23)
CO2: 35 mmol/L — ABNORMAL HIGH (ref 22–32)
Calcium: 9 mg/dL (ref 8.9–10.3)
Chloride: 98 mmol/L (ref 98–111)
Creatinine, Ser: 1.43 mg/dL — ABNORMAL HIGH (ref 0.44–1.00)
GFR, Estimated: 41 mL/min — ABNORMAL LOW (ref >60)
Glucose, Bld: 115 mg/dL — ABNORMAL HIGH (ref 70–99)
Potassium: 4 mmol/L (ref 3.5–5.1)
Sodium: 141 mmol/L (ref 135–145)

## 2020-07-26 LAB — GLUCOSE, CAPILLARY
Glucose-Capillary: 117 mg/dL — ABNORMAL HIGH (ref 70–99)
Glucose-Capillary: 124 mg/dL — ABNORMAL HIGH (ref 70–99)
Glucose-Capillary: 188 mg/dL — ABNORMAL HIGH (ref 70–99)
Glucose-Capillary: 166 mg/dL — ABNORMAL HIGH (ref 70–99)
Glucose-Capillary: 163 mg/dL — ABNORMAL HIGH (ref 70–99)

## 2020-07-26 LAB — CBC
HCT: 24.2 % — ABNORMAL LOW (ref 36.0–46.0)
Hemoglobin: 7.2 g/dL — ABNORMAL LOW (ref 12.0–15.0)
MCH: 30.5 pg (ref 26.0–34.0)
MCHC: 29.8 g/dL — ABNORMAL LOW (ref 30.0–36.0)
MCV: 102.5 fL — ABNORMAL HIGH (ref 80.0–100.0)
Platelets: 143 10*3/uL — ABNORMAL LOW (ref 150–400)
RBC: 2.36 MIL/uL — ABNORMAL LOW (ref 3.87–5.11)
RDW: 13.1 % (ref 11.5–15.5)
WBC: 6.8 10*3/uL (ref 4.0–10.5)
nRBC: 0 % (ref 0.0–0.2)

## 2020-07-26 NOTE — Plan of Care (Signed)

## 2020-07-26 NOTE — Progress Notes (Signed)
PROGRESS NOTE  Annette Gibson GXQ:[id number] DOB: 12/31/1955 DOA: 07/11/2020 PCP: Bridget Hartshorn, NP  HPI/Recap of past 24 hours: Annette Gibson is a 65 y.o. female with a history of COPD, diastolic heart failure, diabetes mellitus, type 2, CKD stage IV. Patient presented secondary to dyspnea and lethargy in setting of acute respiratory failure from multifocal pneumonia and DKA. Complicated by strep bacteremia. She was managed with oxygen via [place], IV insulin. She has completed antibiotics.  07/26/20:  Seen and examined.  She has intermittent dry cough.  Assessment/Plan: Active Problems:   Acute respiratory failure with hypoxia (HCC)   Pneumonia of right lung due to Streptococcus pneumoniae (HCC)   Acute renal failure superimposed on chronic kidney disease (HCC)   Diabetic ketoacidosis without coma associated with type 2 diabetes mellitus (HCC)   Severe sepsis (HCC)   Pneumococcal bacteremia  Acute respiratory failure with hypoxia In setting of multifocal pneumonia in addition to underlying COPD. Patient reports no personal history of COVID-19 infection. Patient still requiring significant amounts of oxygen. Transthoracic Echocardiogram significant for RV dilation and decreased function. D-dimer elevated and perfusion scan was negative for PE. CT chest consistent with pneumonia picture. -Wean to room air as able She has completed her course of antibiotics Rocephin and azithromycin Currently requiring high flow nasal cannula at 20L  Multifocal pneumonia Likely secondary to strep pneumonia. Urine ag positive and blood culture positive. Started on Ceftriaxone/Azithromycin empirically and transitioned to Ceftriaxone monotherapy.  Streptococcus  pneumonia bacteremia Likely secondary to pneumonia. Transthoracic Echocardiogram without evidence of vegetation. Started on ceftriaxone for treatment. Completed Ceftriaxone course on 2/21.  FOBT stool On chart review, patient has a  history of internal hemorrhoids and diverticulosis on recent colonoscopy in 04/2020. Hemoglobin has been drifting down slowly -Trend CBC; transfuse for hemoglobin <7  Severe sepsis Secondary to above problems. Present on admission. Resolved.  Back pain Right flank. Seems related to muscle pain -Heating pad -Flexeril prn  COPD Stable -Continue Duoneb and albuterol prn  Pulmonary hypertension Patient managed with IV diuresis  Thrombocytopenia Mild -Daily CBC  Possible OSA Outpatient sleep study recommended  AKI on CKD stage IV Baseline creatinine of 2.48 from 04/2020. Creatinine of 4.76 on admission. Improvement with IV fluids in setting of DKA requiring IV insulin. She was subsequently transitioned to Lasix IV and is now on oral lasix. Creatinine stable.  Hypokalemia Hypomagnesemia Repleted  DKA Diabetes mellitus, type 2 Patient initially managed on IV insulin and transitioned to Levemir/SSI. Fasting blood sugar adequately controlled. Daytime blood sugar not as well controlled. -Continue Levemir 12 units BID -Continue Novolog 5 units TID with meals -Continue SSI  Normocytic anemia Acute blood loss anemia Baseline hemoglobin of about 12.1 back in November of 2021. Hemoglobin of 10.8 on admission with downward drift. Likely GI bleeding from diverticulosis vs hemorrhoids. Complicated by DVT prophylaxis which was discontinued on 2/22. -Trend CBC Hemoglobin 7.2 this morning. No evidence of active bleeding  Tongue lesions Unknown etiology. Possibly fungal. Improved with magic moutwash -Magic mouthwash x7 days  Possible lung nodule 6 mm, right upper lobe. Recommendation for repeat non-contrast CT at 6-12 months.  Thyroid nodule 2.1 cm in diameter with calcification. Recommendation for non-emergent thyroid ultrasound.   DVT prophylaxis: SCDs secondary to GI bleeding Code Status:   Code Status: Full Code Family Communication: None at  bedside Disposition Plan: Discharge likely in several days pending ability to wean oxygen   Consultants:   PCCM  Procedures:   TRANSTHORACIC ECHOCARDIOGRAM (07/12/2020) IMPRESSIONS  1. Left ventricular ejection fraction, by estimation, is 60 to 65%. The  left ventricle has normal function. The left ventricle has no regional  wall motion abnormalities. Left ventricular diastolic parameters were  normal.  2. Right ventricular systolic function is moderately reduced. The right  ventricular size is moderately enlarged. There is mildly elevated  pulmonary artery systolic pressure.  3. The mitral valve is normal in structure. Trivial mitral valve  regurgitation. No evidence of mitral stenosis.  4. The aortic valve is tricuspid. Aortic valve regurgitation is not  visualized. No aortic stenosis is present.  5. The inferior vena cava is dilated in size with <50% respiratory  variability, suggesting right atrial pressure of 15 mmHg.  Antimicrobials:  Ceftriaxone  Azithromycin      Code Status: Full code  Family Communication: None at bedside  Disposition Plan: Likely home with home health services when can wean off oxygen down to less than 6 L nasal cannula.   Consultants:  PCCM  Procedures:  2D echo  Antimicrobials:  Completed Rocephin and azithromycin.  DVT prophylaxis: SCDs secondary to GI bleed.  Status is: Inpatient    Dispo: The patient is from: Home.              Anticipated d/c is to: Home with home health services.              Anticipated d/c date is: 07/31/2020.              Patient currently not stable for discharge due to still requiring high level oxygen supplementation.   Difficult to place patient not applicable.        Objective: Vitals:   07/26/20 0322 07/26/20 0843 07/26/20 0900 07/26/20 1156  BP: (!) 126/57  (!) 124/53 (!) 133/59  Pulse: (!) 104  93 92  Resp: _0 Temp: 98.4 F (36.9 C)  98.6 F (37 C) 98.9 F  (37.2 C)  TempSrc: Oral  Oral Oral  SpO2: 90% 92% 92% 92%  Weight: 95.3 kg     Height:        Intake/Output Summary (Last 24 hours) at 07/26/2020 1413 Last data filed at 07/26/2020 9357 Gross per 24 hour  Intake 160 ml  Output --  Net 160 ml   Filed Weights   07/24/20 0500 07/25/20 0442 07/26/20 0322  Weight: 96.8 kg 95.5 kg 95.3 kg    Exam:  . General: 65 y.o. year-old female well developed well nourished in no acute distress.  Alert and oriented x3. . Cardiovascular: Regular rate and rhythm with no rubs or gallops.  No thyromegaly or JVD noted.   Marland Kitchen Respiratory: Mild rales at bases with no wheezing noted.  Poor inspiratory effort.   . Abdomen: Soft nontender nondistended with normal bowel sounds x4 quadrants. . Musculoskeletal: Trace lower extremity edema bilaterally. . Skin: No ulcerative lesions noted or rashes, . Psychiatry: Mood is appropriate for condition and setting   Data Reviewed: CBC: Recent Labs  Lab 07/22/20 0558 07/23/20 0318 07/24/20 0315 07/25/20 0404 07/26/20 0418  WBC 8.7 8.0 8.5 7.1 6.8  HGB 7.8* 7.6* 7.4* 7.5* 7.2*  HCT 26.3* 25.5* 23.7* 24.0* 24.2*  MCV 101.5* 102.4* 100.4* 100.0 102.5*  PLT 165 149* 151 139* 017*   Basic Metabolic Panel: Recent Labs  Lab 07/22/20 0558 07/23/20 0318 07/24/20 0315 07/25/20 0404 07/26/20 0418  NA 140 140 140 141 141  K 4.3 3.9 3.9 4.3 4.0  CL 99 97* 97* 98 98  CO2 34* 36* 31 35* 35*  GLUCOSE 131* 101* 131* 141* 115*  BUN _0 CREATININE 1.42* 1.40* 1.56* 1.40* 1.43*  CALCIUM 9.5 9.2 9.0 9.2 9.0   GFR: Estimated Creatinine Clearance: 39.3 mL/min (A) (by C-G formula based on SCr of 1.43 mg/dL (H)). Liver Function Tests: No results for input(s): AST, ALT, ALKPHOS, BILITOT, PROT, ALBUMIN in the last 168 hours. No results for input(s): LIPASE, AMYLASE in the last 168 hours. No results for input(s): AMMONIA in the last 168 hours. Coagulation Profile: No results for input(s): INR, PROTIME in  the last 168 hours. Cardiac Enzymes: No results for input(s): CKTOTAL, CKMB, CKMBINDEX, TROPONINI in the last 168 hours. BNP (last 3 results) No results for input(s): PROBNP in the last 8760 hours. HbA1C: No results for input(s): HGBA1C in the last 72 hours. CBG: Recent Labs  Lab 07/25/20 1636 07/25/20 2131 07/26/20 0641 07/26/20 0652 07/26/20 1117  GLUCAP 224* 119* 117* 124* 188*   Lipid Profile: No results for input(s): CHOL, HDL, LDLCALC, TRIG, CHOLHDL, LDLDIRECT in the last 72 hours. Thyroid Function Tests: No results for input(s): TSH, T4TOTAL, FREET4, T3FREE, THYROIDAB in the last 72 hours. Anemia Panel: No results for input(s): VITAMINB12, FOLATE, FERRITIN, TIBC, IRON, RETICCTPCT in the last 72 hours. Urine analysis:    Component Value Date/Time   COLORURINE YELLOW 07/12/2020 0653   APPEARANCEUR HAZY (A) 07/12/2020 0653   APPEARANCEUR Hazy (A) 01/20/2018 1037   LABSPEC 1.011 07/12/2020 0653   PHURINE 5.0 07/12/2020 0653   GLUCOSEU >=500 (A) 07/12/2020 0653   HGBUR SMALL (A) 07/12/2020 0653   BILIRUBINUR NEGATIVE 07/12/2020 0653   BILIRUBINUR Negative 01/20/2018 1037   KETONESUR NEGATIVE 07/12/2020 0653   PROTEINUR NEGATIVE 07/12/2020 0653   UROBILINOGEN negative 07/25/2014 0838   NITRITE NEGATIVE 07/12/2020 0653   LEUKOCYTESUR NEGATIVE 07/12/2020 0653   Sepsis Labs: _1 (procalcitonin:4,lacticidven:4)  )No results found for this or any previous visit (from the past 240 hour(s)).    Studies: No results found.  Scheduled Meds: . Chlorhexidine Gluconate Cloth  6 each Topical Daily  . furosemide  40 mg Oral Daily  . insulin aspart  0-5 Units Subcutaneous QHS  . insulin aspart  0-9 Units Subcutaneous TID WC  . insulin aspart  5 Units Subcutaneous TID WC  . insulin detemir  12 Units Subcutaneous BID  . ipratropium-albuterol  3 mL Nebulization BID  . magic mouthwash w/lidocaine  15 mL Oral TID  . nystatin   Topical TID  . sodium chloride flush  10-40  mL Intracatheter Q12H    Continuous Infusions: . sodium chloride Stopped (07/19/20 1047)     LOS: 15 days     Kayleen Memos, MD [facility] Pager [phone]  If 7PM-7AM, please contact night-coverage [email or web address] Password [facility] 07/26/2020, 2:13 PM

## 2020-07-26 NOTE — Progress Notes (Signed)
Physical Therapy Treatment Patient Details Name: Annette Gibson. [place] MRN: [id number] DOB: [birth year] Today's Date: 07/26/2020    History of Present Illness 65 yo admitted 2/8 with SOB and lethargy reporting poor PO intake after getting sick from [facility]. Pt with CAP requiring Bipap. PMhx: COPD, CHF, CKD, DM, anxiety    PT Comments    Pt sitting up in recliner on entry. Focus of session to work on endurance and maintaining O2 saturation on 20 L O2 via [place]. Pt able to perform 3 min of slow, steady sit<>stand with increase in HR to 116 bpm. Pt employed purse lip breathing and was able to maintain SaO2 89-93%O2 throughout. Pt had to stop due to muscle fatigue not oxygen demand. Will continue to work on building endurance, mobility is limited by heated high flow O2 hookup. D/c plans continue to remain appropriate when oxygen demand decreases.    Follow Up Recommendations  Home health PT     Equipment Recommendations  None recommended by PT       Precautions / Restrictions Precautions Precautions: Fall Precaution Comments: watch sats, HHFNC Restrictions Weight Bearing Restrictions: No    Mobility  Bed Mobility               General bed mobility comments: OOB in recliner    Transfers Overall transfer level: Needs assistance Equipment used: None Transfers: Sit to/from Stand Sit to Stand: Supervision         General transfer comment: practiced sit<>stand for 3 min, pt able to maintain SaO2 >89%O2 throughout HR max 116 bpm     Balance Overall balance assessment: Needs assistance   Sitting balance-Leahy Scale: Good     Standing balance support: No upper extremity supported;During functional activity Standing balance-Leahy Scale: Fair                              Cognition Arousal/Alertness: Awake/alert Behavior During Therapy: WFL for tasks assessed/performed Overall Cognitive Status: Within Functional Limits for tasks assessed                                         Exercises Other Exercises Other Exercises: 3 min sit<>stand    General Comments General comments (skin integrity, edema, etc.): pt continues to be on 20 L O2 via HFNC FiO2 60%O2, HR at rest 94 bpm,      Pertinent Vitals/Pain Pain Assessment: No/denies pain           PT Goals (current goals can now be found in the care plan section) Acute Rehab PT Goals Patient Stated Goal: return home PT Goal Formulation: With patient Time For Goal Achievement: 07/30/20 Potential to Achieve Goals: Fair Progress towards PT goals: Progressing toward goals    Frequency    Min 3X/week      PT Plan Current plan remains appropriate       AM-PAC PT "6 Clicks" Mobility   Outcome Measure  Help needed turning from your back to your side while in a flat bed without using bedrails?: A Little Help needed moving from lying on your back to sitting on the side of a flat bed without using bedrails?: A Little Help needed moving to and from a bed to a chair (including a wheelchair)?: A Little Help needed standing up from a chair using your arms (e.g., wheelchair or bedside chair)?: A Little  Help needed to walk in hospital room?: A Little Help needed climbing 3-5 steps with a railing? : A Lot 6 Click Score: 17    End of Session Equipment Utilized During Treatment: Oxygen Activity Tolerance: Patient tolerated treatment well Patient left: in chair;with call bell/phone within reach Nurse Communication: Mobility status PT Visit Diagnosis: Other abnormalities of gait and mobility (R26.89);Difficulty in walking, not elsewhere classified (R26.2)     Time: 6415-8309 PT Time Calculation (min) (ACUTE ONLY): 16 min  Charges:  $Therapeutic Exercise: 8-22 mins                     Kymber Kosar B. Migdalia Dk PT, DPT Acute Rehabilitation Services Pager [phone] Office [phone]    [place] 07/26/2020, 3:44 PM

## 2020-07-26 NOTE — Progress Notes (Signed)
Pts order for BIPAP is PRN. Pt currently on HHFNC 20LPM/50% with sats of 98%, no distress noted at rest, pt desats and becomes tachypneic upon exertion. RT will continue to monitor.

## 2020-07-26 NOTE — Plan of Care (Signed)

## 2020-07-26 NOTE — Progress Notes (Signed)
Occupational Therapy Treatment Patient Details Name: Annette Gibson. Hollan MRN: [id number] DOB: 15-Aug-1955 Today's Date: 07/26/2020    History of present illness 65 yo admitted 2/8 with SOB and lethargy reporting poor PO intake after getting sick from [facility]. Pt with CAP requiring Bipap. PMhx: COPD, CHF, CKD, DM, anxiety   OT comments  Pt making good progress towards OT goals for activity tolerance and increased maintenance in SpO2 levels on 20L at 60% FiO2 HHFNC. Today able to perform toilet transfer, peri care, seated grooming without physical assist and able to maintain SpO2 >90% with intentional PLB during transfers and activity. HR increased to 110. Pt continues to benefit from skilled OT and POC remains appropriate.    Follow Up Recommendations  Home health OT    Equipment Recommendations  3 in 1 bedside commode    Recommendations for Other Services      Precautions / Restrictions Precautions Precautions: Fall Precaution Comments: watch sats, HHFNC Restrictions Weight Bearing Restrictions: No       Mobility Bed Mobility               General bed mobility comments: OOB in recliner    Transfers Overall transfer level: Needs assistance Equipment used: None Transfers: Sit to/from Stand Sit to Stand: Supervision         General transfer comment: pt able to maintain SaO2 >89%O2 throughout HR max 115 bpm    Balance Overall balance assessment: Needs assistance   Sitting balance-Leahy Scale: Good     Standing balance support: No upper extremity supported;During functional activity Standing balance-Leahy Scale: Fair                             ADL either performed or assessed with clinical judgement   ADL Overall ADL's : Needs assistance/impaired     Grooming: Wash/dry hands;Wash/dry face;Set up;Sitting Grooming Details (indicate cue type and reason): in recliner                 Toilet Transfer: Supervision/safety;BSC;Stand-pivot Toilet  Transfer Details (indicate cue type and reason): SpO2 remained WFL for transfer Toileting- Clothing Manipulation and Hygiene: Supervision/safety;Sit to/from stand         General ADL Comments: pt continues to present with dyspnea with exertion on HHFNC, decreased activity tolerance and generalized deconditioning     Vision       Perception     Praxis      Cognition Arousal/Alertness: Awake/alert Behavior During Therapy: WFL for tasks assessed/performed Overall Cognitive Status: Within Functional Limits for tasks assessed                                          Exercises Exercises: Other exercises;Hand exercises;General Lower Extremity Other Exercises Other Exercises: 3 min sit<>stand   Shoulder Instructions       General Comments pt continues to be on 20 L O2 via HFNC FiO2 60%O2, HR at rest 94 bpm,    Pertinent Vitals/ Pain       Pain Assessment: No/denies pain Pain Intervention(s): Monitored during session  Home Living                                          Prior Functioning/Environment  Frequency  Min 2X/week        Progress Toward Goals  OT Goals(current goals can now be found in the care plan section)  Progress towards OT goals: Progressing toward goals  Acute Rehab OT Goals Patient Stated Goal: return home OT Goal Formulation: With patient Time For Goal Achievement: 07/31/20 Potential to Achieve Goals: Good  Plan Discharge plan remains appropriate;Frequency remains appropriate    Co-evaluation                 AM-PAC OT "6 Clicks" Daily Activity     Outcome Measure   Help from another person eating meals?: None Help from another person taking care of personal grooming?: None Help from another person toileting, which includes using toliet, bedpan, or urinal?: A Little Help from another person bathing (including washing, rinsing, drying)?: A Little Help from another person to put on  and taking off regular upper body clothing?: None Help from another person to put on and taking off regular lower body clothing?: A Little 6 Click Score: 21    End of Session Equipment Utilized During Treatment: Oxygen (20 L at 60% FiO2 via HHFNC)  OT Visit Diagnosis: Unsteadiness on feet (R26.81);Muscle weakness (generalized) (M62.81)   Activity Tolerance Patient tolerated treatment well   Patient Left in chair;with call bell/phone within reach   Nurse Communication Mobility status        Time: 0034-9179 OT Time Calculation (min): 21 min  Charges: OT General Charges $OT Visit: 1 Visit OT Treatments $Self Care/Home Management : 8-22 mins  Jesse Sans OTR/L Acute Rehabilitation Services Pager: [phone] Office: [place] 07/26/2020, 4:13 PM

## 2020-07-27 ENCOUNTER — Inpatient Hospital Stay (HOSPITAL_COMMUNITY): Payer: Medicare HMO

## 2020-07-27 DIAGNOSIS — N184 Chronic kidney disease, stage 4 (severe): Secondary | ICD-10-CM | POA: Diagnosis not present

## 2020-07-27 DIAGNOSIS — N179 Acute kidney failure, unspecified: Secondary | ICD-10-CM | POA: Diagnosis not present

## 2020-07-27 LAB — BLOOD GAS, ARTERIAL
Acid-Base Excess: 12.1 mmol/L — ABNORMAL HIGH (ref 0.0–2.0)
Bicarbonate: 37.3 mmol/L — ABNORMAL HIGH (ref 20.0–28.0)
Drawn by: 51147
FIO2: 50
O2 Saturation: 91.5 %
Patient temperature: 36.9
pCO2 arterial: 60.8 mmHg — ABNORMAL HIGH (ref 32.0–48.0)
pH, Arterial: 7.404 (ref 7.350–7.450)
pO2, Arterial: 62.1 mmHg — ABNORMAL LOW (ref 83.0–108.0)

## 2020-07-27 LAB — GLUCOSE, CAPILLARY
Glucose-Capillary: 95 mg/dL (ref 70–99)
Glucose-Capillary: 184 mg/dL — ABNORMAL HIGH (ref 70–99)
Glucose-Capillary: 311 mg/dL — ABNORMAL HIGH (ref 70–99)
Glucose-Capillary: 133 mg/dL — ABNORMAL HIGH (ref 70–99)
Glucose-Capillary: 164 mg/dL — ABNORMAL HIGH (ref 70–99)

## 2020-07-27 LAB — CBC
HCT: 25.2 % — ABNORMAL LOW (ref 36.0–46.0)
Hemoglobin: 7.5 g/dL — ABNORMAL LOW (ref 12.0–15.0)
MCH: 30.4 pg (ref 26.0–34.0)
MCHC: 29.8 g/dL — ABNORMAL LOW (ref 30.0–36.0)
MCV: 102 fL — ABNORMAL HIGH (ref 80.0–100.0)
Platelets: 162 10*3/uL (ref 150–400)
RBC: 2.47 MIL/uL — ABNORMAL LOW (ref 3.87–5.11)
RDW: 13.1 % (ref 11.5–15.5)
WBC: 7.4 10*3/uL (ref 4.0–10.5)
nRBC: 0 % (ref 0.0–0.2)

## 2020-07-27 LAB — BASIC METABOLIC PANEL
Anion gap: 10 (ref 5–15)
BUN: 13 mg/dL (ref 8–23)
CO2: 34 mmol/L — ABNORMAL HIGH (ref 22–32)
Calcium: 8.9 mg/dL (ref 8.9–10.3)
Chloride: 97 mmol/L — ABNORMAL LOW (ref 98–111)
Creatinine, Ser: 1.34 mg/dL — ABNORMAL HIGH (ref 0.44–1.00)
GFR, Estimated: 44 mL/min — ABNORMAL LOW (ref >60)
Glucose, Bld: 104 mg/dL — ABNORMAL HIGH (ref 70–99)
Potassium: 4.3 mmol/L (ref 3.5–5.1)
Sodium: 141 mmol/L (ref 135–145)

## 2020-07-27 LAB — TSH: TSH: 1.106 u[IU]/mL (ref 0.350–4.500)

## 2020-07-27 LAB — BRAIN NATRIURETIC PEPTIDE: B Natriuretic Peptide: 76.8 pg/mL (ref 0.0–100.0)

## 2020-07-27 MED ORDER — DOXYCYCLINE HYCLATE 100 MG PO TABS
100.0000 mg | ORAL_TABLET | Freq: Two times a day (BID) | ORAL | Status: DC
  Administered 2020-07-27 – 2020-07-31 (×8): 100 mg via ORAL
  Filled 2020-07-27 (×9): qty 1

## 2020-07-27 MED ORDER — DIPHENHYDRAMINE HCL 25 MG PO CAPS
25.0000 mg | ORAL_CAPSULE | Freq: Four times a day (QID) | ORAL | Status: DC | PRN
  Administered 2020-07-27: 25 mg via ORAL
  Filled 2020-07-27: qty 1

## 2020-07-27 NOTE — Progress Notes (Signed)
PROGRESS NOTE  Annette Gibson GXQ:[id number] DOB: Apr 01, 1956 DOA: 07/11/2020 PCP: Bridget Hartshorn, NP  HPI/Recap of past 24 hours: Annette Gibson is a 65 y.o. female with a history of COPD, diastolic heart failure, diabetes mellitus, type 2, CKD stage IV. Patient presented secondary to dyspnea and lethargy in setting of acute respiratory failure from multifocal pneumonia and DKA. Complicated by strep bacteremia. She was managed with oxygen via [place], IV insulin. She has completed antibiotics.  07/27/20: Seen and examined at bedside. She reports persistent cough with difficulty bringing the sputum up.  Assessment/Plan: Active Problems:   Acute respiratory failure with hypoxia (HCC)   Pneumonia of right lung due to Streptococcus pneumoniae (HCC)   Acute renal failure superimposed on chronic kidney disease ([place])   Diabetic ketoacidosis without coma associated with type 2 diabetes mellitus (HCC)   Severe sepsis (HCC)   Pneumococcal bacteremia  Acute respiratory failure with hypoxia and hypercarbia Not on oxygen supplementation at baseline. In setting of multifocal pneumonia in addition to underlying COPD. Patient reports no personal history of COVID-19 infection. Patient still requiring significant amounts of oxygen. Transthoracic Echocardiogram significant for RV dilation and decreased function. D-dimer elevated and perfusion scan was negative for PE. CT chest consistent with pneumonia picture. -Wean to room air as able She has completed her course of antibiotics Rocephin and azithromycin Currently requiring high flow nasal cannula at 20L Advised to use incentive spirometer every 2 hours and flutter valve every 4 hours while awake. Repeat a chest x-ray on 07/27/2020, personally reviewed, shows mild improvement of right sided pneumonia. Continues to have a semiproductive cough, started doxycycline 100 mg twice daily x5 days. Continue bronchodilators Continue to wean off oxygen  supplementation as tolerated.  Multifocal pneumonia, POA Likely secondary to strep pneumonia. Urine ag positive and blood culture positive. Started on Ceftriaxone/Azithromycin empirically and transitioned to Ceftriaxone monotherapy, completed this therapy. Started on doxycycline 100 mg twice daily for its anti-inflammatory properties, on 07/27/2020.  Streptococcus  pneumonia bacteremia Likely secondary to pneumonia. Transthoracic Echocardiogram without evidence of vegetation. Started on ceftriaxone for treatment. Completed Ceftriaxone course on 2/21.  Resolved AKI on CKD 3B Appears to be back to her baseline creatinine 1.3 with GFR 44 Creatinine 4.76 on admission. Continue to avoid nephrotoxic agents Monitor urine output, none have been recorded  FOBT stool On chart review, patient has a history of internal hemorrhoids and diverticulosis on recent colonoscopy in 04/2020.  No active bleeding Hemoglobin up rising 7.5 from 7.2. Continue to monitor H&H Transfuse with hemoglobin less than 7.0.  Severe sepsis secondary to multifocal pneumonia and streptococcal pneumonia bacteremia, sepsis criteria has resolved. Management per above.  Back pain Right flank. Seems related to muscle pain -Heating pad -Flexeril prn  COPD Stable -Continue Duoneb and albuterol prn  Pulmonary hypertension Patient managed with p.o. Lasix 40 mg daily.  Resolved thrombocytopenia, likely secondary to acute illness, sepsis.  Possible OSA and OHS  Outpatient sleep study recommended BiPAP at night  Resolved post repletion: Hypokalemia Serum potassium 4.3.  Resolved post repletion: Hypomagnesemia Serum magnesium 1.7 Repeat and replete.  DKA Diabetes mellitus, type 2 Hemoglobin A1c 11.8 on 07/11/2020. Patient initially managed on IV insulin and transitioned to Levemir/SSI. Fasting blood sugar adequately controlled.  -Continue Levemir 12 units BID -Continue Novolog 5 units TID with  meals -Continue SSI  Normocytic anemia Acute blood loss anemia Baseline hemoglobin of about 12.1 back in November of 2021. Hemoglobin of 10.8 on admission with downward drift. Likely GI bleeding from  diverticulosis vs hemorrhoids. Complicated by DVT prophylaxis which was discontinued on 2/22. -Trend CBC Hemoglobin 7.2 this morning. No evidence of active bleeding  Tongue lesions Unknown etiology. Possibly fungal. Improved with magic moutwash -Magic mouthwash x7 days  Possible lung nodule 6 mm, right upper lobe. Recommendation for repeat non-contrast CT at 6-12 months.  Thyroid nodule 2.1 cm in diameter with calcification. Recommendation for non-emergent thyroid ultrasound. Obtain TSH.   DVT prophylaxis: SCDs secondary to GI bleeding Code Status:   Code Status: Full Code Family Communication: None at bedside Disposition Plan: Discharge likely in several days pending ability to wean oxygen   Consultants:   PCCM  Procedures:   TRANSTHORACIC ECHOCARDIOGRAM (07/12/2020) IMPRESSIONS    1. Left ventricular ejection fraction, by estimation, is 60 to 65%. The  left ventricle has normal function. The left ventricle has no regional  wall motion abnormalities. Left ventricular diastolic parameters were  normal.  2. Right ventricular systolic function is moderately reduced. The right  ventricular size is moderately enlarged. There is mildly elevated  pulmonary artery systolic pressure.  3. The mitral valve is normal in structure. Trivial mitral valve  regurgitation. No evidence of mitral stenosis.  4. The aortic valve is tricuspid. Aortic valve regurgitation is not  visualized. No aortic stenosis is present.  5. The inferior vena cava is dilated in size with <50% respiratory  variability, suggesting right atrial pressure of 15 mmHg.  Antimicrobials:  Ceftriaxone  Azithromycin  Doxycycline 07/27/20 x 5 days.     Code Status: Full code  Family  Communication: None at bedside  Disposition Plan: Likely home with home health services when can wean off oxygen down to less than 6 L nasal cannula.   Consultants:  PCCM  Procedures:  2D echo   DVT prophylaxis: SCDs secondary to GI bleed.  Status is: Inpatient    Dispo: The patient is from: Home.              Anticipated d/c is to: Home with home health services.              Anticipated d/c date is: 07/31/2020.              Patient currently not stable for discharge due to still requiring high level oxygen supplementation.   Difficult to place patient: Not applicable.        Objective: Vitals:   07/27/20 0816 07/27/20 0915 07/27/20 1047 07/27/20 1309  BP: 128/69 128/69 119/62   Pulse: 97 96 98 94  Resp: 20 (!) 23 (!) 25 (!) 22  Temp: 98.3 F (36.8 C)  98.6 F (37 C)   TempSrc: Oral  Oral   SpO2: 92% 91%  94%  Weight:      Height:        Intake/Output Summary (Last 24 hours) at 07/27/2020 1609 Last data filed at 07/27/2020 1200 Gross per 24 hour  Intake 466 ml  Output 150 ml  Net 316 ml   Filed Weights   07/25/20 0442 07/26/20 0322 07/27/20 0419  Weight: 95.5 kg 95.3 kg 95.4 kg    Exam:  . General: 65 y.o. year-old female well-developed well-nourished no acute stress.  Alert and oriented x3.   . Cardiovascular: Regular rate and rhythm no rubs or gallops. Marland Kitchen Respiratory: Rales noted bilaterally right greater than left.  Poor inspiratory effort.  .  Abdomen: Soft nontender no bowel sounds present.   . Musculoskeletal: Trace lower extremity edema bilaterally.   . Skin: No ulcerative lesions  noted. . Psychiatry: Mood is appropriate for condition and setting.   Data Reviewed: CBC: Recent Labs  Lab 07/23/20 0318 07/24/20 0315 07/25/20 0404 07/26/20 0418 07/27/20 0500  WBC 8.0 8.5 7.1 6.8 7.4  HGB 7.6* 7.4* 7.5* 7.2* 7.5*  HCT 25.5* 23.7* 24.0* 24.2* 25.2*  MCV 102.4* 100.4* 100.0 102.5* 102.0*  PLT 149* 151 139* 143* 202   Basic Metabolic  Panel: Recent Labs  Lab 07/23/20 0318 07/24/20 0315 07/25/20 0404 07/26/20 0418 07/27/20 0500  NA 140 140 141 141 141  K 3.9 3.9 4.3 4.0 4.3  CL 97* 97* 98 98 97*  CO2 36* 31 35* 35* 34*  GLUCOSE 101* 131* 141* 115* 104*  BUN _0 CREATININE 1.40* 1.56* 1.40* 1.43* 1.34*  CALCIUM 9.2 9.0 9.2 9.0 8.9   GFR: Estimated Creatinine Clearance: 42 mL/min (A) (by C-G formula based on SCr of 1.34 mg/dL (H)). Liver Function Tests: No results for input(s): AST, ALT, ALKPHOS, BILITOT, PROT, ALBUMIN in the last 168 hours. No results for input(s): LIPASE, AMYLASE in the last 168 hours. No results for input(s): AMMONIA in the last 168 hours. Coagulation Profile: No results for input(s): INR, PROTIME in the last 168 hours. Cardiac Enzymes: No results for input(s): CKTOTAL, CKMB, CKMBINDEX, TROPONINI in the last 168 hours. BNP (last 3 results) No results for input(s): PROBNP in the last 8760 hours. HbA1C: No results for input(s): HGBA1C in the last 72 hours. CBG: Recent Labs  Lab 07/26/20 1550 07/26/20 2127 07/27/20 0559 07/27/20 1050 07/27/20 1303  GLUCAP 166* 163* 95 184* 311*   Lipid Profile: No results for input(s): CHOL, HDL, LDLCALC, TRIG, CHOLHDL, LDLDIRECT in the last 72 hours. Thyroid Function Tests: No results for input(s): TSH, T4TOTAL, FREET4, T3FREE, THYROIDAB in the last 72 hours. Anemia Panel: No results for input(s): VITAMINB12, FOLATE, FERRITIN, TIBC, IRON, RETICCTPCT in the last 72 hours. Urine analysis:    Component Value Date/Time   COLORURINE YELLOW 07/12/2020 0653   APPEARANCEUR HAZY (A) 07/12/2020 0653   APPEARANCEUR Hazy (A) 01/20/2018 1037   LABSPEC 1.011 07/12/2020 0653   PHURINE 5.0 07/12/2020 0653   GLUCOSEU >=500 (A) 07/12/2020 0653   HGBUR SMALL (A) 07/12/2020 0653   BILIRUBINUR NEGATIVE 07/12/2020 0653   BILIRUBINUR Negative 01/20/2018 1037   KETONESUR NEGATIVE 07/12/2020 0653   PROTEINUR NEGATIVE 07/12/2020 0653   UROBILINOGEN  negative 07/25/2014 0838   NITRITE NEGATIVE 07/12/2020 0653   LEUKOCYTESUR NEGATIVE 07/12/2020 0653   Sepsis Labs: _1 (procalcitonin:4,lacticidven:4)  )No results found for this or any previous visit (from the past 240 hour(s)).    Studies: DG CHEST PORT 1 VIEW  Result Date: 07/27/2020 CLINICAL DATA:  Hypoxia.  COPD. EXAM: PORTABLE CHEST 1 VIEW COMPARISON:  July 20, 2020. FINDINGS: Slight improvement in airspace opacities in the right upper and lower lung. No new consolidation. Suspected small right pleural effusion. No visible left pleural effusions or pneumothorax. Cardiomediastinal silhouette is similar to prior. Right PICC with the tip projecting at the superior vena cava, unchanged. No acute osseous abnormality. IMPRESSION: 1. Slight improvement in airspace opacities in the right lung, concerning for pneumonia. 2. Suspected small right pleural effusion. Electronically Signed   By: Margaretha Sheffield MD   On: 07/27/2020 10:27    Scheduled Meds: . Chlorhexidine Gluconate Cloth  6 each Topical Daily  . doxycycline  100 mg Oral Q12H  . furosemide  40 mg Oral Daily  . insulin aspart  0-5 Units Subcutaneous QHS  . insulin aspart  0-9 Units Subcutaneous TID WC  . insulin aspart  5 Units Subcutaneous TID WC  . insulin detemir  12 Units Subcutaneous BID  . ipratropium-albuterol  3 mL Nebulization BID  . nystatin   Topical TID  . sodium chloride flush  10-40 mL Intracatheter Q12H    Continuous Infusions: . sodium chloride Stopped (07/19/20 1047)     LOS: 16 days     Kayleen Memos, MD [facility] Pager [phone]  If 7PM-7AM, please contact night-coverage [email or web address] Password [facility] 07/27/2020, 4:09 PM

## 2020-07-27 NOTE — Plan of Care (Signed)
  Problem: Education: Goal: Knowledge of General Education information will improve Description: Including pain rating scale, medication(s)/side effects and non-pharmacologic comfort measures Outcome: Progressing

## 2020-07-27 NOTE — Progress Notes (Signed)
MD notified that pt unable to wear BIPAP QHS d/t increased oxygen requirements. MD and RT discussed ambulating the pt to help with her PNA. RT and RN will ambulate pt and monitor. RT will continue to monitor.

## 2020-07-27 NOTE — Plan of Care (Signed)

## 2020-07-27 NOTE — Progress Notes (Signed)
RT and RN ambulated pt in unit on NRB 15 Lpm and pt was able to maintain sats of 96% w/no distress. Pt states she is normally SOB some when she ambulates at home. Pt tolerated very well and is anxious to ambulate again in the morning. Pt is currently on HHFNC 10L/50%. MD [place] Crane aware of ambulation, discussed w/this RT at beginning of shift and agreed pt should be up and ambulating as often as possible. RT will continue to monitor.

## 2020-07-28 DIAGNOSIS — N179 Acute kidney failure, unspecified: Secondary | ICD-10-CM | POA: Diagnosis not present

## 2020-07-28 DIAGNOSIS — N184 Chronic kidney disease, stage 4 (severe): Secondary | ICD-10-CM | POA: Diagnosis not present

## 2020-07-28 LAB — BASIC METABOLIC PANEL
Anion gap: 8 (ref 5–15)
BUN: 15 mg/dL (ref 8–23)
CO2: 35 mmol/L — ABNORMAL HIGH (ref 22–32)
Calcium: 8.8 mg/dL — ABNORMAL LOW (ref 8.9–10.3)
Chloride: 97 mmol/L — ABNORMAL LOW (ref 98–111)
Creatinine, Ser: 1.44 mg/dL — ABNORMAL HIGH (ref 0.44–1.00)
GFR, Estimated: 41 mL/min — ABNORMAL LOW (ref >60)
Glucose, Bld: 131 mg/dL — ABNORMAL HIGH (ref 70–99)
Potassium: 4.1 mmol/L (ref 3.5–5.1)
Sodium: 140 mmol/L (ref 135–145)

## 2020-07-28 LAB — GLUCOSE, CAPILLARY
Glucose-Capillary: 121 mg/dL — ABNORMAL HIGH (ref 70–99)
Glucose-Capillary: 201 mg/dL — ABNORMAL HIGH (ref 70–99)
Glucose-Capillary: 148 mg/dL — ABNORMAL HIGH (ref 70–99)
Glucose-Capillary: 184 mg/dL — ABNORMAL HIGH (ref 70–99)
Glucose-Capillary: 192 mg/dL — ABNORMAL HIGH (ref 70–99)

## 2020-07-28 LAB — PROCALCITONIN: Procalcitonin: 0.16 ng/mL

## 2020-07-28 LAB — CBC
HCT: 25.3 % — ABNORMAL LOW (ref 36.0–46.0)
Hemoglobin: 7.5 g/dL — ABNORMAL LOW (ref 12.0–15.0)
MCH: 30.2 pg (ref 26.0–34.0)
MCHC: 29.6 g/dL — ABNORMAL LOW (ref 30.0–36.0)
MCV: 102 fL — ABNORMAL HIGH (ref 80.0–100.0)
Platelets: 229 10*3/uL (ref 150–400)
RBC: 2.48 MIL/uL — ABNORMAL LOW (ref 3.87–5.11)
RDW: 13.2 % (ref 11.5–15.5)
WBC: 7 10*3/uL (ref 4.0–10.5)
nRBC: 0 % (ref 0.0–0.2)

## 2020-07-28 LAB — IRON AND TIBC
Iron: 37 ug/dL (ref 28–170)
Saturation Ratios: 13 % (ref 10.4–31.8)
TIBC: 276 ug/dL (ref 250–450)
UIBC: 239 ug/dL

## 2020-07-28 LAB — VITAMIN B12: Vitamin B-12: 938 pg/mL — ABNORMAL HIGH (ref 180–914)

## 2020-07-28 LAB — FERRITIN: Ferritin: 111 ng/mL (ref 11–307)

## 2020-07-28 MED ORDER — DIPHENHYDRAMINE HCL 25 MG PO CAPS
25.0000 mg | ORAL_CAPSULE | Freq: Three times a day (TID) | ORAL | Status: DC | PRN
  Administered 2020-07-28 – 2020-07-30 (×4): 25 mg via ORAL
  Filled 2020-07-28 (×4): qty 1

## 2020-07-28 MED ORDER — CAMPHOR-MENTHOL 0.5-0.5 % EX LOTN
TOPICAL_LOTION | CUTANEOUS | Status: DC | PRN
  Filled 2020-07-28: qty 222

## 2020-07-28 MED ORDER — FOLIC ACID 1 MG PO TABS
1.0000 mg | ORAL_TABLET | Freq: Every day | ORAL | Status: DC
  Administered 2020-07-28 – 2020-07-31 (×4): 1 mg via ORAL
  Filled 2020-07-28 (×4): qty 1

## 2020-07-28 MED ORDER — FERROUS SULFATE 325 (65 FE) MG PO TABS
325.0000 mg | ORAL_TABLET | Freq: Every day | ORAL | Status: DC
  Administered 2020-07-28 – 2020-07-31 (×4): 325 mg via ORAL
  Filled 2020-07-28 (×4): qty 1

## 2020-07-28 NOTE — Progress Notes (Signed)
Occupational Therapy Treatment Patient Details Name: Annette Gibson. Fanguy MRN: [id number] DOB: [birth year] Today's Date: 07/28/2020    History of present illness 65 yo admitted 2/8 with SOB and lethargy reporting poor PO intake after getting sick from [facility]. Pt with CAP requiring Bipap. PMhx: COPD, CHF, CKD, DM, anxiety   OT comments  Pt progressing well towards OT goals. Eager for hallway ambulation . Able to ambulate in hallway on 10L O2 with NRB. SPO2 remained >93% throughout, HR up from 100 to 128 with standing rest breaks to bring back down. Pt occasionally reaching out for external support (rail in hallway). Pt able to demonstrate sink level grooming at supervision level. OT will continue to follow acutely.    Follow Up Recommendations  Home health OT    Equipment Recommendations  3 in 1 bedside commode    Recommendations for Other Services      Precautions / Restrictions Precautions Precautions: Fall Precaution Comments: watch sats, HFNC Restrictions Weight Bearing Restrictions: No       Mobility Bed Mobility Overal bed mobility: Modified Independent                  Transfers Overall transfer level: Needs assistance Equipment used: None Transfers: Sit to/from Stand Sit to Stand: Supervision         General transfer comment: pt able to maintain SaO2 >93%O2 throughout on 10L HFNC HR max 128 bpm    Balance Overall balance assessment: Needs assistance   Sitting balance-Leahy Scale: Good     Standing balance support: No upper extremity supported;During functional activity Standing balance-Leahy Scale: Fair                             ADL either performed or assessed with clinical judgement   ADL Overall ADL's : Needs assistance/impaired     Grooming: Wash/dry hands;Supervision/safety;Standing Grooming Details (indicate cue type and reason): in Customer service manager Transfer: Supervision/safety;Ambulation Toilet Transfer  Details (indicate cue type and reason): SpO2 remained WFL for transfer                 Vision       Perception     Praxis      Cognition Arousal/Alertness: Awake/alert Behavior During Therapy: WFL for tasks assessed/performed Overall Cognitive Status: Within Functional Limits for tasks assessed                                          Exercises     Shoulder Instructions       General Comments      Pertinent Vitals/ Pain       Pain Assessment: No/denies pain Pain Intervention(s): Monitored during session  Home Living                                          Prior Functioning/Environment              Frequency  Min 2X/week        Progress Toward Goals  OT Goals(current goals can now be found in the care plan section)  Progress towards OT goals: Progressing toward goals  Acute Rehab OT Goals Patient  Stated Goal: return home OT Goal Formulation: With patient Time For Goal Achievement: 07/31/20 Potential to Achieve Goals: Good  Plan Discharge plan remains appropriate;Frequency remains appropriate    Co-evaluation                 AM-PAC OT "6 Clicks" Daily Activity     Outcome Measure   Help from another person eating meals?: None Help from another person taking care of personal grooming?: None Help from another person toileting, which includes using toliet, bedpan, or urinal?: A Little Help from another person bathing (including washing, rinsing, drying)?: A Little Help from another person to put on and taking off regular upper body clothing?: None Help from another person to put on and taking off regular lower body clothing?: A Little 6 Click Score: 21    End of Session Equipment Utilized During Treatment: Oxygen (10L via NRB)  OT Visit Diagnosis: Unsteadiness on feet (R26.81);Muscle weakness (generalized) (M62.81)   Activity Tolerance Patient tolerated treatment well   Patient Left in bed;Other  (comment) (EOB)   Nurse Communication Mobility status        Time: 2820-8138 OT Time Calculation (min): 17 min  Charges: OT General Charges $OT Visit: 1 Visit OT Treatments $Therapeutic Activity: 8-22 mins  Jesse Sans OTR/L Acute Rehabilitation Services Pager: [phone] Office: [place] 07/28/2020, 1:15 PM

## 2020-07-28 NOTE — Progress Notes (Signed)
Physical Therapy Treatment Patient Details Name: Annette Gibson. Lodwick MRN: [id number] DOB: September 15, 1955 Today's Date: 07/28/2020    History of Present Illness 65 yo admitted 2/8 with SOB and lethargy reporting poor PO intake after getting sick from [facility]. Pt with CAP requiring Bipap. PMhx: COPD, CHF, CKD, DM, anxiety    PT Comments    Pt on 8LO2 via HFNC upon PT arrival to room, satting in high 90s. Pt motivated to ambulate in hallway, with Spo2 93% and greater on 15LO2 NRB mask. Pt with intermittent standing rest breaks during mobility to recover dyspnea and HR, with HRmax during mobility 121 bpm. Pt with SPO2 maintained 95% and greater on 8LO2 during repeated sit to stands. Pt pleased with her O2 requirement and strength progress, PT to continue to follow acutely.      Follow Up Recommendations  Home health PT     Equipment Recommendations  None recommended by PT    Recommendations for Other Services       Precautions / Restrictions Precautions Precautions: Fall Precaution Comments: watch sats, HFNC Restrictions Weight Bearing Restrictions: No    Mobility  Bed Mobility Overal bed mobility: Modified Independent             General bed mobility comments: sitting EOB upon PT arrival to room    Transfers Overall transfer level: Needs assistance Equipment used: None Transfers: Sit to/from Stand Sit to Stand: Supervision         General transfer comment: for safety  Ambulation/Gait Ambulation/Gait assistance: Min guard Gait Distance (Feet): 75 Feet (x4 bouts) Assistive device: None Gait Pattern/deviations: Step-through pattern;Decreased stride length Gait velocity: decr   General Gait Details: min guard for safety, verbal cuing for activity pacing. SPO2 93% and greater on NRB mask, 15LO2. Standing rest breaks x3 during mobility.   Stairs             Wheelchair Mobility    Modified Rankin (Stroke Patients Only)       Balance Overall balance  assessment: Needs assistance Sitting-balance support: Feet supported Sitting balance-Leahy Scale: Good     Standing balance support: No upper extremity supported;During functional activity Standing balance-Leahy Scale: Fair Standing balance comment: able to ambulate without AD, balance not challenged                            Cognition Arousal/Alertness: Awake/alert Behavior During Therapy: WFL for tasks assessed/performed Overall Cognitive Status: Within Functional Limits for tasks assessed                                        Exercises Other Exercises Other Exercises: 2 sets of 5 sit<>stands, for LE strengthening and cardiovascular endurance    General Comments        Pertinent Vitals/Pain Pain Assessment: Faces Faces Pain Scale: No hurt Pain Intervention(s): Limited activity within patient's tolerance;Monitored during session    Home Living                      Prior Function            PT Goals (current goals can now be found in the care plan section) Acute Rehab PT Goals Patient Stated Goal: return home PT Goal Formulation: With patient Time For Goal Achievement: 07/30/20 Potential to Achieve Goals: Fair Progress towards PT goals: Progressing toward goals  Frequency    Min 3X/week      PT Plan Current plan remains appropriate    Co-evaluation              AM-PAC PT "6 Clicks" Mobility   Outcome Measure  Help needed turning from your back to your side while in a flat bed without using bedrails?: A Little Help needed moving from lying on your back to sitting on the side of a flat bed without using bedrails?: A Little Help needed moving to and from a bed to a chair (including a wheelchair)?: A Little Help needed standing up from a chair using your arms (e.g., wheelchair or bedside chair)?: A Little Help needed to walk in hospital room?: A Little Help needed climbing 3-5 steps with a railing? : A Lot 6  Click Score: 17    End of Session Equipment Utilized During Treatment: Oxygen Activity Tolerance: Patient tolerated treatment well Patient left: with call bell/phone within reach;in bed Nurse Communication: Mobility status PT Visit Diagnosis: Other abnormalities of gait and mobility (R26.89);Difficulty in walking, not elsewhere classified (R26.2)     Time: 7680-8811 PT Time Calculation (min) (ACUTE ONLY): 22 min  Charges:  $Gait Training: 8-22 mins                     Stacie Glaze, PT Acute Rehabilitation Services Pager [phone]  Office [phone]   Louis Matte 07/28/2020, 3:57 PM

## 2020-07-28 NOTE — TOC Progression Note (Addendum)
Transition of Care (TOC) - Progression Note    Patient Details  Name: Annette Gibson. Gingras MRN: [id number] Date of Birth: [birth year]  Transition of Care [facility]) CM/SW Contact  Zenon Mayo, RN Phone Number: 07/28/2020, 4:22 PM  Clinical Narrative:    Per previous NCM note, [employer] will provide the home oxygen for patient and NIV, Thedore Mins was notified, he states will need the ambulatory sats and the order.  Also patient is set up with [facility] with [facility] by previous NCM , the orders are in for [facility], [place], [place], [place] and [place].  TOC team will continue to follow for dc needs.      Barriers to Discharge: Continued Medical Work up  Expected Discharge Plan and Services     Discharge Planning Services: CM Consult                     DME Arranged: Oxygen                     Social Determinants of Health (SDOH) Interventions    Readmission Risk Interventions No flowsheet data found.

## 2020-07-28 NOTE — Progress Notes (Addendum)
PROGRESS NOTE  Jasminemarie L. [name] HCC:[id number] DOB: 06/22/1955 DOA: 07/11/2020 PCP: Bridget Hartshorn, NP  HPI/Recap of past 24 hours: [name]. Massingale is a 65 y.o. female with a history of COPD, diastolic heart failure, diabetes mellitus, type 2, CKD stage IV. Patient presented secondary to dyspnea and lethargy in setting of acute respiratory failure from multifocal pneumonia and DKA. Complicated by strep bacteremia. She was managed with oxygen via [place], IV insulin. She has completed antibiotics.  Hospital course complicated by worsening hypoxia requiring 20 L high flow nasal cannula to maintain O2 saturation greater than 90%.  Currently being weaned off.  Currently on 9 L high flow nasal cannula with O2 saturation in the mid 90s to upper 90s.  She was seen by PT OT with recommendation for home health PT OT.  TOC has been consulted to assist with home health services arrangement.  07/28/20: Seen and examined at her bedside.  She ambulated yesterday with RT Sallye Lat and is very appreciative of RT's assistance.  States her breathing is improved and is looking forward to mobilizing more.  Assessment/Plan: Active Problems:   Acute respiratory failure with hypoxia (HCC)   Pneumonia of right lung due to Streptococcus pneumoniae (HCC)   Acute renal failure superimposed on chronic kidney disease ([place])   Diabetic ketoacidosis without coma associated with type 2 diabetes mellitus (HCC)   Severe sepsis (HCC)   Pneumococcal bacteremia  Improving acute respiratory failure with hypoxia and hypercarbia Not on oxygen supplementation at baseline. Patient reports no prior history of COVID-19 infection. CTA chest was negative for PE. CT chest consistent with multifocal pneumonia. She was treated with a course of IV antibiotics Rocephin and azithromycin. She is currently being weaned off oxygen supplementation, currently on 9 L nasal cannula from 20 L. Continue to mobilize as tolerated Continue  incentive spirometer and flutter valve Continue bronchodilators and p.o. doxycycline. Maintain O2 saturation greater than 90%  Multifocal pneumonia, POA Likely secondary to strep pneumonia. Urine ag positive and blood culture positive. Started on Ceftriaxone/Azithromycin empirically and transitioned to Ceftriaxone monotherapy. Continue doxycycline 100 mg twice daily, started on 07/27/2020 for bronchitis.  Streptococcus  pneumonia bacteremia Likely secondary to pneumonia. Transthoracic Echocardiogram without evidence of vegetation. Started on ceftriaxone for treatment. Completed Ceftriaxone course on 2/21.  AKI on CKD 3B Baseline creatinine appears to be 1.3 with GFR 44 Mild up rising of creatinine 1.4 Creatinine on admission 4.76. Continue to avoid nephrotoxic agents. Monitor urine output, record if possible. Repeat BMP in the morning  Iron deficiency anemia with positive FOBT.  On chart review, patient has a history of internal hemorrhoids and diverticulosis on recent colonoscopy in 04/2020.  Iron deficiency, start ferrous sulfate 325 mg daily. No overt bleeding Hemoglobin stable at 7.5 from 7.5K.  Transfuse hemoglobin less than 7.0K.  Resolved severe sepsis secondary to multifocal pneumonia and streptococcal pneumonia bacteremia, sepsis criteria has resolved. Treated.  Back pain Right flank. Seems related to muscle pain -Heating pad -Flexeril prn  COPD Stable -Continue Duoneb and albuterol prn  Pulmonary hypertension Patient managed with p.o. Lasix 40 mg daily.  Resolved thrombocytopenia, likely secondary to acute illness, sepsis.  Possible OSA and OHS  Outpatient sleep study recommended BiPAP at night  Resolved post repletion: Hypokalemia Serum potassium 4.3.  Resolved post repletion: Hypomagnesemia Serum magnesium 1.7 Repeat magnesium level in the morning.  Resolved DKA Diabetes mellitus, type 2 Hemoglobin A1c 11.8 on 07/11/2020. Patient initially  managed on IV insulin and transitioned to Levemir/SSI. Fasting blood  sugar adequately controlled.  -Continue Levemir 12 units BID -Continue Novolog 5 units TID with meals -Continue SSI  Tongue lesions Unknown etiology. Possibly fungal. Improved with magic moutwash -Magic mouthwash x7 days  Possible lung nodule 6 mm, right upper lobe. Recommendation for repeat non-contrast CT at 6-12 months.  Thyroid nodule 2.1 cm in diameter with calcification. Recommendation for non-emergent thyroid ultrasound. TSH 1.10 on 07/27/2020.  Pruritus P.o. Benadryl as needed Sarna on dry skin.  Morbid obesity BMI 43 Recommend weight loss outpatient with regular physical activity and healthy dieting.     DVT prophylaxis: SCDs secondary to GI bleeding Code Status:   Code Status: Full Code Family Communication: None at bedside Disposition Plan: Discharge likely in several days pending ability to wean oxygen to less than 6 L nasal cannula.  Procedures:   TRANSTHORACIC ECHOCARDIOGRAM (07/12/2020) IMPRESSIONS    1. Left ventricular ejection fraction, by estimation, is 60 to 65%. The  left ventricle has normal function. The left ventricle has no regional  wall motion abnormalities. Left ventricular diastolic parameters were  normal.  2. Right ventricular systolic function is moderately reduced. The right  ventricular size is moderately enlarged. There is mildly elevated  pulmonary artery systolic pressure.  3. The mitral valve is normal in structure. Trivial mitral valve  regurgitation. No evidence of mitral stenosis.  4. The aortic valve is tricuspid. Aortic valve regurgitation is not  visualized. No aortic stenosis is present.  5. The inferior vena cava is dilated in size with <50% respiratory  variability, suggesting right atrial pressure of 15 mmHg.  Antimicrobials:  Ceftriaxone  Azithromycin  Doxycycline 07/27/20 x 5 days.  Consultants:  PCCM  Procedures:  2D  echo   DVT prophylaxis: SCDs secondary to GI bleed.  Status is: Inpatient    Dispo: The patient is from: Home.              Anticipated d/c is to: Home with home health services.              Anticipated d/c date is: 07/31/2020.              Patient currently not stable for discharge due to still requiring high level oxygen supplementation.   Difficult to place patient: Not applicable.        Objective: Vitals:   07/28/20 0732 07/28/20 0850 07/28/20 0855 07/28/20 1137  BP:    137/75  Pulse:    98  Resp:    19  Temp:    97.7 F (36.5 C)  TempSrc:    Oral  SpO2: 90% 100% 99% 100%  Weight:      Height:        Intake/Output Summary (Last 24 hours) at 07/28/2020 1541 Last data filed at 07/28/2020 1130 Gross per 24 hour  Intake 984.26 ml  Output 0 ml  Net 984.26 ml   Filed Weights   07/26/20 0322 07/27/20 0419 07/28/20 0400  Weight: 95.3 kg 95.4 kg 94.4 kg    Exam:  . General: 65 y.o. year-old female well-developed well-nourished pleasant in no acute stress.  She is alert and oriented x3.   . Cardiovascular: Regular rate and rhythm no rubs or gallops.   Marland Kitchen Respiratory: Mild rales at bases no wheezing noted.  Good inspiratory effort. .  Abdomen: Soft nontender no bowel sounds present.   . Musculoskeletal: Trace lower extremity edema bilaterally. . Skin: No ulcerative lesions noted. Marland Kitchen Psychiatry: Mood is appropriate for condition stable   Data Reviewed: CBC:  Recent Labs  Lab 07/24/20 0315 07/25/20 0404 07/26/20 0418 07/27/20 0500 07/28/20 0417  WBC 8.5 7.1 6.8 7.4 7.0  HGB 7.4* 7.5* 7.2* 7.5* 7.5*  HCT 23.7* 24.0* 24.2* 25.2* 25.3*  MCV 100.4* 100.0 102.5* 102.0* 102.0*  PLT 151 139* 143* 162 671   Basic Metabolic Panel: Recent Labs  Lab 07/24/20 0315 07/25/20 0404 07/26/20 0418 07/27/20 0500 07/28/20 0417  NA 140 141 141 141 140  K 3.9 4.3 4.0 4.3 4.1  CL 97* 98 98 97* 97*  CO2 31 35* 35* 34* 35*  GLUCOSE 131* 141* 115* 104* 131*  BUN _0 CREATININE 1.56* 1.40* 1.43* 1.34* 1.44*  CALCIUM 9.0 9.2 9.0 8.9 8.8*   GFR: Estimated Creatinine Clearance: 38.8 mL/min (A) (by C-G formula based on SCr of 1.44 mg/dL (H)). Liver Function Tests: No results for input(s): AST, ALT, ALKPHOS, BILITOT, PROT, ALBUMIN in the last 168 hours. No results for input(s): LIPASE, AMYLASE in the last 168 hours. No results for input(s): AMMONIA in the last 168 hours. Coagulation Profile: No results for input(s): INR, PROTIME in the last 168 hours. Cardiac Enzymes: No results for input(s): CKTOTAL, CKMB, CKMBINDEX, TROPONINI in the last 168 hours. BNP (last 3 results) No results for input(s): PROBNP in the last 8760 hours. HbA1C: No results for input(s): HGBA1C in the last 72 hours. CBG: Recent Labs  Lab 07/27/20 1614 07/27/20 2114 07/28/20 0606 07/28/20 0826 07/28/20 1140  GLUCAP 133* 164* 121* 201* 148*   Lipid Profile: No results for input(s): CHOL, HDL, LDLCALC, TRIG, CHOLHDL, LDLDIRECT in the last 72 hours. Thyroid Function Tests: Recent Labs    07/27/20 1800  TSH 1.106   Anemia Panel: Recent Labs    07/28/20 0605  VITAMINB12 938*  FERRITIN 111  TIBC 276  IRON 37   Urine analysis:    Component Value Date/Time   COLORURINE YELLOW 07/12/2020 0653   APPEARANCEUR HAZY (A) 07/12/2020 0653   APPEARANCEUR Hazy (A) 01/20/2018 1037   LABSPEC 1.011 07/12/2020 0653   PHURINE 5.0 07/12/2020 0653   GLUCOSEU >=500 (A) 07/12/2020 0653   HGBUR SMALL (A) 07/12/2020 0653   BILIRUBINUR NEGATIVE 07/12/2020 0653   BILIRUBINUR Negative 01/20/2018 1037   KETONESUR NEGATIVE 07/12/2020 0653   PROTEINUR NEGATIVE 07/12/2020 0653   UROBILINOGEN negative 07/25/2014 0838   NITRITE NEGATIVE 07/12/2020 0653   LEUKOCYTESUR NEGATIVE 07/12/2020 0653   Sepsis Labs: _1 (procalcitonin:4,lacticidven:4)  )No results found for this or any previous visit (from the past 240 hour(s)).    Studies: No results found.  Scheduled  Meds: . Chlorhexidine Gluconate Cloth  6 each Topical Daily  . doxycycline  100 mg Oral Q12H  . folic acid  1 mg Oral Daily  . furosemide  40 mg Oral Daily  . insulin aspart  0-5 Units Subcutaneous QHS  . insulin aspart  0-9 Units Subcutaneous TID WC  . insulin aspart  5 Units Subcutaneous TID WC  . insulin detemir  12 Units Subcutaneous BID  . ipratropium-albuterol  3 mL Nebulization BID  . nystatin   Topical TID  . sodium chloride flush  10-40 mL Intracatheter Q12H    Continuous Infusions: . sodium chloride Stopped (07/19/20 1047)     LOS: 17 days     Kayleen Memos, MD [facility] Pager [phone]  If 7PM-7AM, please contact night-coverage [email or web address] Password [facility] 07/28/2020, 3:41 PM

## 2020-07-28 NOTE — Progress Notes (Signed)
Patient refused the bipap tonight.

## 2020-07-28 NOTE — Plan of Care (Signed)
  Problem: Education: Goal: Knowledge of General Education information will improve Description: Including pain rating scale, medication(s)/side effects and non-pharmacologic comfort measures Outcome: Progressing   Problem: Health Behavior/Discharge Planning: Goal: Ability to manage health-related needs will improve Outcome: Progressing   Problem: Clinical Measurements: Goal: Ability to maintain clinical measurements within normal limits will improve Outcome: Progressing   Problem: Clinical Measurements: Goal: Diagnostic test results will improve Outcome: Progressing   Problem: Clinical Measurements: Goal: Respiratory complications will improve Outcome: Progressing   Problem: Activity: Goal: Risk for activity intolerance will decrease Outcome: Progressing   Problem: Nutrition: Goal: Adequate nutrition will be maintained Outcome: Progressing   Problem: Pain Managment: Goal: General experience of comfort will improve Outcome: Progressing   Problem: Skin Integrity: Goal: Risk for impaired skin integrity will decrease Outcome: Progressing

## 2020-07-29 DIAGNOSIS — N179 Acute kidney failure, unspecified: Secondary | ICD-10-CM | POA: Diagnosis not present

## 2020-07-29 DIAGNOSIS — R739 Hyperglycemia, unspecified: Secondary | ICD-10-CM | POA: Diagnosis not present

## 2020-07-29 DIAGNOSIS — R7881 Bacteremia: Secondary | ICD-10-CM | POA: Diagnosis not present

## 2020-07-29 DIAGNOSIS — J189 Pneumonia, unspecified organism: Secondary | ICD-10-CM | POA: Diagnosis not present

## 2020-07-29 LAB — BASIC METABOLIC PANEL
Anion gap: 8 (ref 5–15)
BUN: 16 mg/dL (ref 8–23)
CO2: 34 mmol/L — ABNORMAL HIGH (ref 22–32)
Calcium: 9.1 mg/dL (ref 8.9–10.3)
Chloride: 99 mmol/L (ref 98–111)
Creatinine, Ser: 1.61 mg/dL — ABNORMAL HIGH (ref 0.44–1.00)
GFR, Estimated: 36 mL/min — ABNORMAL LOW (ref >60)
Glucose, Bld: 161 mg/dL — ABNORMAL HIGH (ref 70–99)
Potassium: 4.1 mmol/L (ref 3.5–5.1)
Sodium: 141 mmol/L (ref 135–145)

## 2020-07-29 LAB — CBC
HCT: 24.8 % — ABNORMAL LOW (ref 36.0–46.0)
Hemoglobin: 7.6 g/dL — ABNORMAL LOW (ref 12.0–15.0)
MCH: 30.8 pg (ref 26.0–34.0)
MCHC: 30.6 g/dL (ref 30.0–36.0)
MCV: 100.4 fL — ABNORMAL HIGH (ref 80.0–100.0)
Platelets: 280 10*3/uL (ref 150–400)
RBC: 2.47 MIL/uL — ABNORMAL LOW (ref 3.87–5.11)
RDW: 13.2 % (ref 11.5–15.5)
WBC: 7.4 10*3/uL (ref 4.0–10.5)
nRBC: 0 % (ref 0.0–0.2)

## 2020-07-29 LAB — GLUCOSE, CAPILLARY
Glucose-Capillary: 139 mg/dL — ABNORMAL HIGH (ref 70–99)
Glucose-Capillary: 204 mg/dL — ABNORMAL HIGH (ref 70–99)
Glucose-Capillary: 195 mg/dL — ABNORMAL HIGH (ref 70–99)
Glucose-Capillary: 235 mg/dL — ABNORMAL HIGH (ref 70–99)

## 2020-07-29 MED ORDER — IPRATROPIUM-ALBUTEROL 0.5-2.5 (3) MG/3ML IN SOLN
3.0000 mL | Freq: Two times a day (BID) | RESPIRATORY_TRACT | Status: DC
  Administered 2020-07-29 – 2020-07-31 (×4): 3 mL via RESPIRATORY_TRACT
  Filled 2020-07-29 (×4): qty 3

## 2020-07-29 MED ORDER — GUAIFENESIN ER 600 MG PO TB12
600.0000 mg | ORAL_TABLET | Freq: Two times a day (BID) | ORAL | Status: DC
  Administered 2020-07-29 – 2020-07-31 (×5): 600 mg via ORAL
  Filled 2020-07-29 (×5): qty 1

## 2020-07-29 MED ORDER — IPRATROPIUM-ALBUTEROL 0.5-2.5 (3) MG/3ML IN SOLN
3.0000 mL | Freq: Four times a day (QID) | RESPIRATORY_TRACT | Status: DC
  Administered 2020-07-29: 3 mL via RESPIRATORY_TRACT
  Filled 2020-07-29: qty 3

## 2020-07-29 NOTE — Progress Notes (Signed)
[facility]  PROGRESS NOTE  Annette Gibson IRS:[id number] DOB: [birth year] DOA: 07/11/2020 PCP: Bridget Hartshorn, NP   Brief HPI:   65 year old female with history of COPD, diastolic heart failure, diabetes mellitus type, CKD stage IV presented to hospital with dyspnea, lethargy in setting of acute respiratory failure from multifocal pneumonia and DKA.  This was complicated by strep bacteremia.  Hospital course was complicated by worsening hypoxemia, requiring 20 L high flow nasal cannula to maintain O2 sats more than 90%.  Currently she is being weaned off oxygen.  She was seen by PT/OT recommend home health PT/OT.    Subjective   Patient seen and examined, her breathing has improved.  She is currently on 4 L/min of oxygen.  She is diuresing well with Lasix 40 mg daily.   Assessment/Plan:     1. Acute respiratory failure with hypoxemia and hypercapnia apnea-patient presented with dyspnea, lethargy.  CT chest showed multifocal pneumonia.  D-dimer was elevated and perfusion scan was negative for PE.  Patient was started on Rocephin and Zithromax.  Initially required 20 L of oxygen, oxygen being weaned off.  Currently requiring 4 L/min of oxygen.  Patient is on bronchodilators and p.o. doxycycline.  Continue incentive spirometry, flutter valve. 2. Multifocal pneumonia, POA-patient has strep pneumoniae bacteremia, urine antigen positive for strep.  She was treated with Rocephin and Zithromax.  Patient was started on doxycycline for bronchitis on 07/27/2020.  Plan to continue doxycycline till August 01, 2020. 3. Streptococcus pneumoniae bacteremia-likely secondary to pneumonia, transthoracic echocardiogram did not show vegetation.  She completed ceftriaxone treatment on 07/24/2020. 4. Acute kidney injury on CKD stage IIIb-improved, patient presented with creatinine of 4.76 on admission.  Her creatinine is down to 1.61.  She is on Lasix 40 mg daily.  Creatinine is slowly rising.  Will hold  Lasix for now.  Follow BMP in am. 5. Guaiac positive stool-patient has history of internal hemorrhoids and diverticulosis on recent colonoscopy in November 2021.  Hemoglobin is stable at 7.6.  Continue to monitor H&H.  Transfuse for hemoglobin less than 7.0. 6. COPD-stable, continue DuoNeb, albuterol as needed. 7. Hypomagnesemia-replete 8. Hypokalemia-replete 9. ?  OSA/OHS-outpatient sleep study recommended. 10. Diabetes mellitus type 2-hemoglobin A1c 11.8 on 07/11/2020.  Patient was initially managed with IV insulin and transition to Levemir/SSI.  Continue Levemir 12 units twice daily, NovoLog 5 units 3 times daily with meals, sliding scale insulin with NovoLog. 11. Normocytic anemia-Baseline hemoglobin around 12 in November 2021.  Her hemoglobin was 7.8 on admission, she has history of internal hemorrhoids and diverticulosis on recent colonoscopy.  Will transfuse to keep hemoglobin more than 7.0. 12. Lung nodule-6 mm right upper lobe nodule noted.  Recommendation to repeat noncontrast CT at 6 to 12 months. 13. Thyroid nodule-2.1 cm in diameter with calcification, recommended to obtain nonemergent thyroid ultrasound. 14. Tongue lesion-?  Thrush.  Improved with Magic mouthwash for 7 days.     COVID-19 Labs  Recent Labs    07/28/20 0093  FERRITIN 111    Lab Results  Component Value Date   SARSCOV2NAA NEGATIVE 07/11/2020   [place] NEGATIVE 05/01/2020     Scheduled medications:   . Chlorhexidine Gluconate Cloth  6 each Topical Daily  . doxycycline  100 mg Oral Q12H  . ferrous sulfate  325 mg Oral Q breakfast  . folic acid  1 mg Oral Daily  . furosemide  40 mg Oral Daily  . guaiFENesin  600 mg Oral BID  . insulin aspart  0-5 Units Subcutaneous QHS  . insulin aspart  0-9 Units Subcutaneous TID WC  . insulin aspart  5 Units Subcutaneous TID WC  . insulin detemir  12 Units Subcutaneous BID  . ipratropium-albuterol  3 mL Nebulization QID  . nystatin   Topical TID  . sodium  chloride flush  10-40 mL Intracatheter Q12H         CBG: Recent Labs  Lab 07/28/20 1140 07/28/20 1557 07/28/20 2103 07/29/20 0609 07/29/20 1105  GLUCAP 148* 184* 192* 139* 204*    SpO2: 100 % O2 Flow Rate (L/min): 4 L/min FiO2 (%): 50 %    CBC: Recent Labs  Lab 07/25/20 0404 07/26/20 0418 07/27/20 0500 07/28/20 0417 07/29/20 0325  WBC 7.1 6.8 7.4 7.0 7.4  HGB 7.5* 7.2* 7.5* 7.5* 7.6*  HCT 24.0* 24.2* 25.2* 25.3* 24.8*  MCV 100.0 102.5* 102.0* 102.0* 100.4*  PLT 139* 143* 162 229 829    Basic Metabolic Panel: Recent Labs  Lab 07/25/20 0404 07/26/20 0418 07/27/20 0500 07/28/20 0417 07/29/20 0325  NA 141 141 141 140 141  K 4.3 4.0 4.3 4.1 4.1  CL 98 98 97* 97* 99  CO2 35* 35* 34* 35* 34*  GLUCOSE 141* 115* 104* 131* 161*  BUN _0 CREATININE 1.40* 1.43* 1.34* 1.44* 1.61*  CALCIUM 9.2 9.0 8.9 8.8* 9.1     Liver Function Tests: No results for input(s): AST, ALT, ALKPHOS, BILITOT, PROT, ALBUMIN in the last 168 hours.   Antibiotics: Anti-infectives (From admission, onward)   Start     Dose/Rate Route Frequency Ordered Stop   07/27/20 1700  doxycycline (VIBRA-TABS) tablet 100 mg        100 mg Oral Every 12 hours 07/27/20 1609 08/01/20 2159   07/11/20 2145  cefTRIAXone (ROCEPHIN) 1 g in sodium chloride 0.9 % 100 mL IVPB  Status:  Discontinued        1 g 200 mL/hr over 30 Minutes Intravenous Every 12 hours 07/11/20 2141 07/11/20 2149   07/11/20 1745  cefTRIAXone (ROCEPHIN) 2 g in sodium chloride 0.9 % 100 mL IVPB        2 g 200 mL/hr over 30 Minutes Intravenous Every 24 hours 07/11/20 1741 07/24/20 1710   07/11/20 1745  azithromycin (ZITHROMAX) 500 mg in sodium chloride 0.9 % 250 mL IVPB  Status:  Discontinued        500 mg 250 mL/hr over 60 Minutes Intravenous Every 24 hours 07/11/20 1741 07/12/20 1307       DVT prophylaxis: SCDs, has history of GI bleed.  Code Status: Full code  Family Communication: No family at  bedside   Consultants:    Procedures:      Objective   Vitals:   07/29/20 0732 07/29/20 0741 07/29/20 1108 07/29/20 1210  BP: (!) 134/59  (!) 137/56   Pulse: 90 93 99 92  Resp: 20 18 (!) 24 (!) 23  Temp: 98.4 F (36.9 C)  99.2 F (37.3 C)   TempSrc: Oral  Oral   SpO2: 97% 100% 96% 100%  Weight:      Height:        Intake/Output Summary (Last 24 hours) at 07/29/2020 1222 Last data filed at 07/29/2020 0800 Gross per 24 hour  Intake 480 ml  Output 100 ml  Net 380 ml    02/24 1901 - 02/26 0700 In: 1224.3 [P.O.:1200; I.V.:24.3] Out: 0   Filed Weights   07/27/20 0419 07/28/20 0400 07/29/20 0500  Weight: 95.4  kg 94.4 kg 96.5 kg    Physical Examination:    General: Appears in no acute distress  Cardiovascular: S1-S2, regular, no murmur auscultated  Respiratory: Decreased breath sounds at lung bases  Abdomen: Abdomen is soft, nontender, no organomegaly  Extremities: No edema in the lower extremities  Neurologic: Alert, oriented x3, intact insight and judgment, no focal deficit noted   Status is: Inpatient  Dispo: The patient is from: Home              Anticipated d/c is to: Home              Anticipated d/c date is: August 01, 2020              Patient currently not stable for discharge  Barrier to discharge-still requiring oxygen, oxygen being weaned off.            Data Reviewed:   No results found for this or any previous visit (from the past 240 hour(s)).  No results for input(s): LIPASE, AMYLASE in the last 168 hours. No results for input(s): AMMONIA in the last 168 hours.  Cardiac Enzymes: No results for input(s): CKTOTAL, CKMB, CKMBINDEX, TROPONINI in the last 168 hours. BNP (last 3 results) Recent Labs    07/11/20 1723 07/27/20 0500  BNP 680.1* 76.8    ProBNP (last 3 results) No results for input(s): PROBNP in the last 8760 hours.  Studies:  No results found.     Oswald Hillock   [facility] If 7PM-7AM,  please contact night-coverage at [email or web address], Office  [phone]   07/29/2020, 12:22 PM  LOS: 18 days

## 2020-07-29 NOTE — Progress Notes (Signed)
Occupational Therapy Treatment Patient Details Name: Annette Gibson MRN: [id number] DOB: January 09, 1956 Today's Date: 07/29/2020    History of present illness 65 yo admitted 2/8 with SOB and lethargy reporting poor PO intake after getting sick from [facility]. Pt with CAP requiring Bipap. PMhx: COPD, CHF, CKD, DM, anxiety   OT comments  Pt making excellent progress towards OT goals this session, especially compared to previous OT sessions. Pt now on 4L HFNC at rest with sats [facility]. Pt completed toilet transfer to [facility] with no AD with pt completing toileting hygiene on 4L HFNC with no more than supervision- set- up assist. Pt requires set- up assist for LB ADLs and MIN A for UB ADLs ( but only d/t lines). Pt completed functional mobility greater than a household distance with no AD with min guard- gross supervision with 15L NRB mask. SpO2 briefly drop to low 80s however poor pleth noted with sats quickly returning to > 90%. Pt HR max 138 bpm with mobility. Pt would continue to benefit from skilled occupational therapy while admitted and after d/c to address the below listed limitations in order to improve overall functional mobility and facilitate independence with BADL participation. DC plan remains appropriate, will follow acutely per POC.     Follow Up Recommendations  Home health OT    Equipment Recommendations  3 in 1 bedside commode    Recommendations for Other Services      Precautions / Restrictions Precautions Precautions: Fall Precaution Comments: watch sats/ HR, HFNC Restrictions Weight Bearing Restrictions: No       Mobility Bed Mobility               General bed mobility comments: pt OOB in recliner and returned to recliner at end of session    Transfers Overall transfer level: Needs assistance Equipment used: None Transfers: Sit to/from Stand Sit to Stand: Supervision         General transfer comment: multiple sit<>stands during session with supervision only for  safety    Balance Overall balance assessment: Needs assistance Sitting-balance support: Feet supported Sitting balance-Leahy Scale: Good Sitting balance - Comments: sitting EOB   Standing balance support: No upper extremity supported;During functional activity Standing balance-Leahy Scale: Fair Standing balance comment: able to complete functional mobility with no BUE support and pt additionally completing ADLs with no UE support with no LOB                           ADL either performed or assessed with clinical judgement   ADL Overall ADL's : Needs assistance/impaired             Lower Body Bathing: Set up;Sit to/from stand Lower Body Bathing Details (indicate cue type and reason): simulated via anterior pericare Upper Body Dressing : Minimal assistance;Standing Upper Body Dressing Details (indicate cue type and reason): to don gown as back side cover Lower Body Dressing: Set up;Sitting/lateral leans Lower Body Dressing Details (indicate cue type and reason): to don socks Toilet Transfer: Supervision/safety;Ambulation Toilet Transfer Details (indicate cue type and reason): simulated via functional mobility Toileting- Clothing Manipulation and Hygiene: Set up;Sit to/from stand Toileting - Clothing Manipulation Details (indicate cue type and reason): from [facility] with set- up of wash cloths     Functional mobility during ADLs: Min guard;Rolling walker;Supervision/safety General ADL Comments: pt with good improvements in activity tolerance and overall functional endurance with pt able to complete functional mobility greater than a household distance, toileting tasks,  and UB/LB dressing with up to min guard assist     Vision       Perception     Praxis      Cognition Arousal/Alertness: Awake/alert Behavior During Therapy: WFL for tasks assessed/performed Overall Cognitive Status: Within Functional Limits for tasks assessed                                           Exercises     Shoulder Instructions       General Comments pt on 4L HFNC seated in recliner with sats WFL with pt at rest, pt required 15 L NRB mask for functional mobility greater than a household distance with sats briefly dropping to 80s however poor pleth noted and quickly rebounded to greater than 90%. HR increase to as much as 138 bpm with mobility    Pertinent Vitals/ Pain       Pain Assessment: Faces Faces Pain Scale: No hurt  Home Living                                          Prior Functioning/Environment              Frequency  Min 2X/week        Progress Toward Goals  OT Goals(current goals can now be found in the care plan section)  Progress towards OT goals: Progressing toward goals  Acute Rehab OT Goals Patient Stated Goal: return home OT Goal Formulation: With patient Time For Goal Achievement: 07/31/20 Potential to Achieve Goals: Good  Plan Discharge plan remains appropriate;Frequency remains appropriate    Co-evaluation                 AM-PAC OT "6 Clicks" Daily Activity     Outcome Measure   Help from another person eating meals?: None Help from another person taking care of personal grooming?: None Help from another person toileting, which includes using toliet, bedpan, or urinal?: None Help from another person bathing (including washing, rinsing, drying)?: A Little Help from another person to put on and taking off regular upper body clothing?: None Help from another person to put on and taking off regular lower body clothing?: A Little 6 Click Score: 22    End of Session Equipment Utilized During Treatment: Oxygen;Other (comment) (15L NRB mask ( with mobility)- 4L HFNC at rest)  OT Visit Diagnosis: Unsteadiness on feet (R26.81);Muscle weakness (generalized) (M62.81)   Activity Tolerance Patient tolerated treatment well   Patient Left in chair;with call bell/phone within reach   Nurse  Communication Mobility status        Time: 2162-4469 OT Time Calculation (min): 17 min  Charges: OT General Charges $OT Visit: 1 Visit OT Treatments $Self Care/Home Management : 8-22 mins  Harley Alto., COTA/L Acute Rehabilitation Services [phone]    Precious Haws 07/29/2020, 2:20 PM

## 2020-07-29 NOTE — Progress Notes (Signed)
Pt refused bipap for tonight

## 2020-07-29 NOTE — Plan of Care (Signed)
  Problem: Education: Goal: Knowledge of General Education information will improve Description: Including pain rating scale, medication(s)/side effects and non-pharmacologic comfort measures Outcome: Progressing   Problem: Health Behavior/Discharge Planning: Goal: Ability to manage health-related needs will improve Outcome: Progressing   Problem: Clinical Measurements: Goal: Ability to maintain clinical measurements within normal limits will improve Outcome: Progressing   Problem: Clinical Measurements: Goal: Respiratory complications will improve Outcome: Progressing   Problem: Activity: Goal: Risk for activity intolerance will decrease Outcome: Progressing   Problem: Pain Managment: Goal: General experience of comfort will improve Outcome: Progressing   Problem: Skin Integrity: Goal: Risk for impaired skin integrity will decrease Outcome: Progressing

## 2020-07-30 ENCOUNTER — Inpatient Hospital Stay (HOSPITAL_COMMUNITY): Payer: Medicare HMO

## 2020-07-30 DIAGNOSIS — N179 Acute kidney failure, unspecified: Secondary | ICD-10-CM | POA: Diagnosis not present

## 2020-07-30 DIAGNOSIS — J189 Pneumonia, unspecified organism: Secondary | ICD-10-CM | POA: Diagnosis not present

## 2020-07-30 DIAGNOSIS — R7881 Bacteremia: Secondary | ICD-10-CM | POA: Diagnosis not present

## 2020-07-30 DIAGNOSIS — J13 Pneumonia due to Streptococcus pneumoniae: Secondary | ICD-10-CM | POA: Diagnosis not present

## 2020-07-30 LAB — CBC
HCT: 25.5 % — ABNORMAL LOW (ref 36.0–46.0)
Hemoglobin: 7.6 g/dL — ABNORMAL LOW (ref 12.0–15.0)
MCH: 30.4 pg (ref 26.0–34.0)
MCHC: 29.8 g/dL — ABNORMAL LOW (ref 30.0–36.0)
MCV: 102 fL — ABNORMAL HIGH (ref 80.0–100.0)
Platelets: 344 10*3/uL (ref 150–400)
RBC: 2.5 MIL/uL — ABNORMAL LOW (ref 3.87–5.11)
RDW: 13.3 % (ref 11.5–15.5)
WBC: 7.6 10*3/uL (ref 4.0–10.5)
nRBC: 0 % (ref 0.0–0.2)

## 2020-07-30 LAB — BASIC METABOLIC PANEL
Anion gap: 10 (ref 5–15)
BUN: 18 mg/dL (ref 8–23)
CO2: 32 mmol/L (ref 22–32)
Calcium: 8.8 mg/dL — ABNORMAL LOW (ref 8.9–10.3)
Chloride: 100 mmol/L (ref 98–111)
Creatinine, Ser: 1.65 mg/dL — ABNORMAL HIGH (ref 0.44–1.00)
GFR, Estimated: 34 mL/min — ABNORMAL LOW (ref >60)
Glucose, Bld: 134 mg/dL — ABNORMAL HIGH (ref 70–99)
Potassium: 4.3 mmol/L (ref 3.5–5.1)
Sodium: 142 mmol/L (ref 135–145)

## 2020-07-30 LAB — GLUCOSE, CAPILLARY
Glucose-Capillary: 115 mg/dL — ABNORMAL HIGH (ref 70–99)
Glucose-Capillary: 162 mg/dL — ABNORMAL HIGH (ref 70–99)
Glucose-Capillary: 206 mg/dL — ABNORMAL HIGH (ref 70–99)
Glucose-Capillary: 174 mg/dL — ABNORMAL HIGH (ref 70–99)

## 2020-07-30 NOTE — Plan of Care (Signed)
  Problem: Education: Goal: Knowledge of General Education information will improve Description: Including pain rating scale, medication(s)/side effects and non-pharmacologic comfort measures Outcome: Progressing   Problem: Health Behavior/Discharge Planning: Goal: Ability to manage health-related needs will improve Outcome: Progressing   Problem: Clinical Measurements: Goal: Ability to maintain clinical measurements within normal limits will improve Outcome: Progressing   Problem: Clinical Measurements: Goal: Respiratory complications will improve Outcome: Progressing   Problem: Activity: Goal: Risk for activity intolerance will decrease Outcome: Progressing   Problem: Nutrition: Goal: Adequate nutrition will be maintained Outcome: Progressing   Problem: Pain Managment: Goal: General experience of comfort will improve Outcome: Progressing

## 2020-07-30 NOTE — Progress Notes (Addendum)
[facility]  PROGRESS NOTE  Annette Gibson ION:[id number] DOB: [birth year] DOA: 07/11/2020 PCP: Bridget Hartshorn, NP   Brief HPI:   65 year old female with history of COPD, diastolic heart failure, diabetes mellitus type, CKD stage IV presented to hospital with dyspnea, lethargy in setting of acute respiratory failure from multifocal pneumonia and DKA.  This was complicated by strep bacteremia.  Hospital course was complicated by worsening hypoxemia, requiring 20 L high flow nasal cannula to maintain O2 sats more than 90%.  Currently she is being weaned off oxygen.  She was seen by PT/OT recommend home health PT/OT.    Subjective   Patient seen and examined, denies shortness of breath.   Assessment/Plan:     1. Acute respiratory failure with hypoxemia and hypercapnia apnea-patient presented with dyspnea, lethargy.  CT chest showed multifocal pneumonia.  D-dimer was elevated and perfusion scan was negative for PE.  Patient was started on Rocephin and Zithromax.  Initially required 20 L of oxygen, oxygen being weaned off.  Currently requiring 4 L/min of oxygen.  Patient is on bronchodilators and p.o. doxycycline.  Continue incentive spirometry, flutter valve.   2. Multifocal pneumonia, POA-patient has strep pneumoniae bacteremia, urine antigen positive for strep.  She was treated with Rocephin and Zithromax.  Patient was started on doxycycline for bronchitis on 07/27/2020.  Plan to continue doxycycline till August 01, 2020.  Will obtain chest x-ray today. 3. Streptococcus pneumoniae bacteremia-likely secondary to pneumonia, transthoracic echocardiogram did not show vegetation.  She completed ceftriaxone treatment on 07/24/2020. 4. Acute kidney injury on CKD stage IIIb-improved, patient presented with creatinine of 4.76 on admission.  Her creatinine had improved initially now creatinine is getting worse.  Today creatinine is 1.65.  She has been on Lasix 40 mg p.o. daily.  Will hold Lasix  for now.  Follow BMP in am.  5. Guaiac positive stool-patient has history of internal hemorrhoids and diverticulosis on recent colonoscopy in November 2021.  Hemoglobin is stable at 7.6.  Continue to monitor H&H.  Transfuse for hemoglobin less than 7.0. 6. COPD-stable, continue DuoNeb, albuterol as needed.  Patient is currently on 4 L/min of oxygen via nasal cannula.  She has been refusing BiPAP in the hospital.  Does not want [facility] at home.  She can likely be discharged home on oxygen. 7. Hypomagnesemia-replete 8. Hypokalemia-replete 9. ?  OSA/OHS-outpatient sleep study recommended. 10. Diabetes mellitus type 2-hemoglobin A1c 11.8 on 07/11/2020.  Patient was initially managed with IV insulin and transition to Levemir/SSI.  Continue Levemir 12 units twice daily, NovoLog 5 units 3 times daily with meals, sliding scale insulin with NovoLog.  CBG well controlled. 11. Normocytic anemia-Baseline hemoglobin around 12 in November 2021.  Her hemoglobin was 7.8 on admission, she has history of internal hemorrhoids and diverticulosis on recent colonoscopy.  Will transfuse to keep hemoglobin more than 7.0. 12. Lung nodule-6 mm right upper lobe nodule noted.  Recommendation to repeat noncontrast CT at 6 to 12 months. 13. Thyroid nodule-2.1 cm in diameter with calcification, recommended to obtain nonemergent thyroid ultrasound. 14. Tongue lesion-?  Thrush.  Improved with Magic mouthwash for 7 days.     COVID-19 Labs  Recent Labs    07/28/20 2440  FERRITIN 111    Lab Results  Component Value Date   SARSCOV2NAA NEGATIVE 07/11/2020   [place] NEGATIVE 05/01/2020     Scheduled medications:   . Chlorhexidine Gluconate Cloth  6 each Topical Daily  . doxycycline  100 mg Oral Q12H  . ferrous  sulfate  325 mg Oral Q breakfast  . folic acid  1 mg Oral Daily  . guaiFENesin  600 mg Oral BID  . insulin aspart  0-5 Units Subcutaneous QHS  . insulin aspart  0-9 Units Subcutaneous TID WC  . insulin aspart   5 Units Subcutaneous TID WC  . insulin detemir  12 Units Subcutaneous BID  . ipratropium-albuterol  3 mL Nebulization BID  . nystatin   Topical TID  . sodium chloride flush  10-40 mL Intracatheter Q12H         CBG: Recent Labs  Lab 07/29/20 1105 07/29/20 1548 07/29/20 2114 07/30/20 0613 07/30/20 1041  GLUCAP 204* 195* 235* 115* 162*    SpO2: 98 % O2 Flow Rate (L/min): 4 L/min FiO2 (%): 50 %    CBC: Recent Labs  Lab 07/26/20 0418 07/27/20 0500 07/28/20 0417 07/29/20 0325 07/30/20 0320  WBC 6.8 7.4 7.0 7.4 7.6  HGB 7.2* 7.5* 7.5* 7.6* 7.6*  HCT 24.2* 25.2* 25.3* 24.8* 25.5*  MCV 102.5* 102.0* 102.0* 100.4* 102.0*  PLT 143* 162 229 280 073    Basic Metabolic Panel: Recent Labs  Lab 07/26/20 0418 07/27/20 0500 07/28/20 0417 07/29/20 0325 07/30/20 0320  NA 141 141 140 141 142  K 4.0 4.3 4.1 4.1 4.3  CL 98 97* 97* 99 100  CO2 35* 34* 35* 34* 32  GLUCOSE 115* 104* 131* 161* 134*  BUN _0 CREATININE 1.43* 1.34* 1.44* 1.61* 1.65*  CALCIUM 9.0 8.9 8.8* 9.1 8.8*     Liver Function Tests: No results for input(s): AST, ALT, ALKPHOS, BILITOT, PROT, ALBUMIN in the last 168 hours.   Antibiotics: Anti-infectives (From admission, onward)   Start     Dose/Rate Route Frequency Ordered Stop   07/27/20 1700  doxycycline (VIBRA-TABS) tablet 100 mg        100 mg Oral Every 12 hours 07/27/20 1609 08/01/20 2159   07/11/20 2145  cefTRIAXone (ROCEPHIN) 1 g in sodium chloride 0.9 % 100 mL IVPB  Status:  Discontinued        1 g 200 mL/hr over 30 Minutes Intravenous Every 12 hours 07/11/20 2141 07/11/20 2149   07/11/20 1745  cefTRIAXone (ROCEPHIN) 2 g in sodium chloride 0.9 % 100 mL IVPB        2 g 200 mL/hr over 30 Minutes Intravenous Every 24 hours 07/11/20 1741 07/24/20 1710   07/11/20 1745  azithromycin (ZITHROMAX) 500 mg in sodium chloride 0.9 % 250 mL IVPB  Status:  Discontinued        500 mg 250 mL/hr over 60 Minutes Intravenous Every 24 hours  07/11/20 1741 07/12/20 1307       DVT prophylaxis: SCDs, has history of GI bleed.  Code Status: Full code  Family Communication: No family at bedside   Consultants:    Procedures:      Objective   Vitals:   07/30/20 0500 07/30/20 0734 07/30/20 0901 07/30/20 1119  BP:  129/72  134/72  Pulse:  87  98  Resp:  14  20  Temp:  98 F (36.7 C)  98.9 F (37.2 C)  TempSrc:  Oral  Oral  SpO2:  95% 95% 98%  Weight: 95.3 kg     Height:        Intake/Output Summary (Last 24 hours) at 07/30/2020 1203 Last data filed at 07/30/2020 0800 Gross per 24 hour  Intake 1080 ml  Output 1200 ml  Net -120 ml  02/25 1901 - 02/27 0700 In: 1080 [P.O.:1080] Out: 900 [Urine:900]  Filed Weights   07/28/20 0400 07/29/20 0500 07/30/20 0500  Weight: 94.4 kg 96.5 kg 95.3 kg    Physical Examination:   General-appears in no acute distress  Heart-S1-S2, regular, no murmur auscultated  Lungs-faint crackles at lung bases  Abdomen-soft, nontender, no organomegaly  Extremities-no edema in the lower extremities  Neuro-alert, oriented x3, no focal deficit noted   Status is: Inpatient  Dispo: The patient is from: Home              Anticipated d/c is to: Home              Anticipated d/c date is: August 01, 2020              Patient currently not stable for discharge  Barrier to discharge-still requiring oxygen, oxygen being weaned off.      Data Reviewed:   No results found for this or any previous visit (from the past 240 hour(s)).  No results for input(s): LIPASE, AMYLASE in the last 168 hours. No results for input(s): AMMONIA in the last 168 hours.  Cardiac Enzymes: No results for input(s): CKTOTAL, CKMB, CKMBINDEX, TROPONINI in the last 168 hours. BNP (last 3 results) Recent Labs    07/11/20 1723 07/27/20 0500  BNP 680.1* 76.8        [place]   [facility] If 7PM-7AM, please contact night-coverage at [email or web address], Office   [phone]   07/30/2020, 12:03 PM  LOS: 19 days

## 2020-07-31 DIAGNOSIS — R0902 Hypoxemia: Secondary | ICD-10-CM

## 2020-07-31 LAB — BASIC METABOLIC PANEL
Anion gap: 9 (ref 5–15)
BUN: 19 mg/dL (ref 8–23)
CO2: 31 mmol/L (ref 22–32)
Calcium: 8.8 mg/dL — ABNORMAL LOW (ref 8.9–10.3)
Chloride: 102 mmol/L (ref 98–111)
Creatinine, Ser: 1.58 mg/dL — ABNORMAL HIGH (ref 0.44–1.00)
GFR, Estimated: 36 mL/min — ABNORMAL LOW (ref >60)
Glucose, Bld: 114 mg/dL — ABNORMAL HIGH (ref 70–99)
Potassium: 4.6 mmol/L (ref 3.5–5.1)
Sodium: 142 mmol/L (ref 135–145)

## 2020-07-31 LAB — CBC
HCT: 28.2 % — ABNORMAL LOW (ref 36.0–46.0)
Hemoglobin: 8.3 g/dL — ABNORMAL LOW (ref 12.0–15.0)
MCH: 30.2 pg (ref 26.0–34.0)
MCHC: 29.4 g/dL — ABNORMAL LOW (ref 30.0–36.0)
MCV: 102.5 fL — ABNORMAL HIGH (ref 80.0–100.0)
Platelets: 402 10*3/uL — ABNORMAL HIGH (ref 150–400)
RBC: 2.75 MIL/uL — ABNORMAL LOW (ref 3.87–5.11)
RDW: 13.2 % (ref 11.5–15.5)
WBC: 9.1 10*3/uL (ref 4.0–10.5)
nRBC: 0 % (ref 0.0–0.2)

## 2020-07-31 LAB — GLUCOSE, CAPILLARY
Glucose-Capillary: 117 mg/dL — ABNORMAL HIGH (ref 70–99)
Glucose-Capillary: 195 mg/dL — ABNORMAL HIGH (ref 70–99)

## 2020-07-31 MED ORDER — INSULIN PEN NEEDLE 32G X 4 MM MISC: 2 refills | Status: AC

## 2020-07-31 MED ORDER — GUAIFENESIN ER 600 MG PO TB12: 600.0000 mg | ORAL_TABLET | Freq: Two times a day (BID) | ORAL | Status: AC

## 2020-07-31 MED ORDER — FUROSEMIDE 40 MG PO TABS: 40.0000 mg | ORAL_TABLET | Freq: Every day | ORAL | Status: AC

## 2020-07-31 MED ORDER — NOVOLOG 70/30 FLEXPEN RELION (70-30) 100 UNIT/ML ~~LOC~~ SUPN: 15.0000 [IU] | PEN_INJECTOR | Freq: Two times a day (BID) | SUBCUTANEOUS | 0 refills | Status: AC

## 2020-07-31 MED ORDER — FERROUS SULFATE 325 (65 FE) MG PO TABS: 325.0000 mg | ORAL_TABLET | Freq: Every day | ORAL | 3 refills | Status: AC

## 2020-07-31 MED ORDER — IPRATROPIUM-ALBUTEROL 0.5-2.5 (3) MG/3ML IN SOLN: 3.0000 mL | Freq: Two times a day (BID) | RESPIRATORY_TRACT | 0 refills | Status: AC

## 2020-07-31 NOTE — Progress Notes (Signed)
Inpatient Diabetes Program Recommendations  AACE/ADA: New Consensus Statement on Inpatient Glycemic Control (2015)  Target Ranges:  Prepandial:   less than 140 mg/dL      Peak postprandial:   less than 180 mg/dL (1-2 hours)      Critically ill patients:  140 - 180 mg/dL   Lab Results  Component Value Date   GLUCAP 195 (H) 07/31/2020   HGBA1C 11.8 (H) 07/11/2020    Review of Glycemic Control Results for Annette Gibson, Annette L. (MRN [id number]) as of 07/31/2020 13:50  Ref. Range 07/30/2020 16:02 07/30/2020 21:06 07/31/2020 05:57 07/31/2020 10:51  Glucose-Capillary Latest Ref Range: 70 - 99 mg/dL 206 (H) 174 (H) 117 (H) 195 (H)   Diabetes history: Type 2 DM Outpatient Diabetes medications: Glipizide 5 mg QD Current orders for Inpatient glycemic control: Novolog 0-9 units TID, Novolog 0-5 units QHS  Inpatient Diabetes Program Recommendations:     Spoke with patient regarding outpatient diabetes management. Verified home medications and was recently taken off Metformin due to GFR by nephrology.  Reviewed patient's current A1c of 11.8%. Explained what a A1c is and what it measures. Also reviewed goal A1c with patient, importance of good glucose control @ home, and blood sugar goals. Reviewed patho of DM, need for insulin, role of pancreas, impact to kidneys and risk for further infection, vascular changes and commorbidities.  Patient has a meter and glucose supplies. Encouraged to continue checking 2 time per day at minimum. Reviewed signs and symptoms of hypo vs hyper glycemia, interventions, and when to call MD. Reinforced importance of being mindful of CHO intake, choosing alternatives to sugary beverages and plate method. Patient plans to follow up with PCP and is motivated. Patient had been opposed to performing injections out of fear, however, with reassurance is willing to perform at home.  Educated patient and spouse on insulin pen use at home. Reviewed contents of insulin flexpen starter kit.  Reviewed all steps if insulin pen including attachment of needle, 2-unit air shot, dialing up dose, giving injection, removing needle, disposal of sharps, storage of unused insulin, disposal of insulin etc. Patient able to provide successful return demonstration. Also reviewed troubleshooting with insulin pen. MD to give patient Rxs for insulin pens and insulin pen needles.  Recommending Novolog 70/30 15 units BID flexpen for discharge #[id number]- Insulin pen needles #[id number]- Novolog 70/30 flex pen  Thanks, Bronson Curb, MSN, RNC-OB Diabetes Coordinator [phone] (8a-5p)<

## 2020-07-31 NOTE — Progress Notes (Signed)
Note for Medical Necessity for DME  Patient continues to exhibit signs of hypercapnia associated with morbid obesity that is causing thoracic restriction.  Interruption or failure to provide NIV would quickly lead to exacerbation of the patient's condition, hospital admission, and likely harm to the patient. Continued use is preferred.  The use of the NIV will treat patient's high PC02 levels and can reduce risk of exacerbations and future hospitalizations when used at night and during the day.  BiLevel/RAD has been considered and ruled out as patient requires continuous alarms, backup battery, and portability which are not possible with BiLevel/RAD devices.  Ventilation is required to decrease the work of breathing and improve pulmonary status. Interruption of ventilator support would lead to decline of health status.  Patient is able to protect their airways and clear secretions on their own.

## 2020-07-31 NOTE — Progress Notes (Signed)
At bedside to remove PICC per MD order.  Patient states that she is not discharging home today and wants to leave PICC in place until D/C.  Secure chat to Dr. Eliseo Squires and Vinnie Level, RN with this information.  Vinnie Level agrees to notify IV team if patient is discharged today and PICC needs to be removed.

## 2020-07-31 NOTE — Progress Notes (Signed)
{  PT ALL NOTESATURATION QUALIFICATIONS: (This note is used to comply with regulatory documentation for home oxygen)  Patient Saturations on Room Air at Rest = 82%  Patient Saturations on Room Air while Ambulating = 80%  Patient Saturations on 6 Liters of oxygen while Ambulating = 92%  Please briefly explain why patient needs home oxygen:  Ambulating 3 L SpO2 drop to 85% increase to 4 L able to ambulate 100 ft at 88-90% then dropped to 81%, increase to 6L and able to maintain > 88%; Pt will need home oxygen to maintain appropriate SpO2 during functional mobility tasks  Lyanne Co, DPT Acute Rehabilitation Services [phone]

## 2020-07-31 NOTE — Discharge Summary (Signed)
Physician Discharge Summary  Annette Gibson. [name] RXE:[id number] DOB: May 23, 1956 DOA: 07/11/2020  PCP: Bridget Hartshorn, NP  Admit date: 07/11/2020 Discharge date: 07/31/2020  Admitted From: home Discharge disposition: home   Recommendations for Outpatient Follow-Up:   O2/trilogy-- will need outpatient sleep study Home health Cbc/BMP 1 week Repeat x ray to ensure resolution of pna Thyroid ultrasound Lung nodule: Recommendation to repeat noncontrast CT at 6 to 12 months  Discharge Diagnosis:   Active Problems:   Acute respiratory failure with hypoxia (HCC)   Pneumonia of right lung due to Streptococcus pneumoniae ([place])   Acute renal failure superimposed on chronic kidney disease ([place])   Diabetic ketoacidosis without coma associated with type 2 diabetes mellitus ([place])   Severe sepsis ([place])   Pneumococcal bacteremia    Discharge Condition: Improved.  Diet recommendation: Low sodium, heart healthy.  Carbohydrate-modified.  Wound care: None.  Code status: Full.   History of Present Illness:   65 yo femal with pmh of copd, diastolic hf, ckd4, dm2, anxiety presents with sob and lethargy. Pt provides own history without great issue. Pt states that last Monday she went to [facility] and became sick shortly after with diarrhea. Since then she has not had good intake but reports she has been cont to take all her medications. She states that she failed to improve and for the past 3 days she has remained in bed and not felt as though she could get up. She endorses continued diarrhea, no n/v no recent sick contacts. She states she has only been able to take in ginger ale. Today she became worsening sob, she does state that she may have been before today but hadn't really moved. She suffers from copd and chronic cough that is non productive and this has not changed.   She did present to [facility] yesterday with BS in 700's and was discharged from ed but failed to improve.   Hospital Course  by Problem:    Acute respiratory failure with hypoxemia and hypercapnia apnea-patient presented with dyspnea, lethargy.  CT chest showed multifocal pneumonia.  D-dimer was elevated and perfusion scan was negative for PE.  Patient was started on Rocephin and Zithromax.  Initially required 20 L of oxygen, oxygen being weaned off.  Currently requiring 4 L/min of oxygen- send home with O2    Multifocal pneumonia, POA-patient has strep pneumoniae bacteremia, urine antigen positive for strep.  She was treated with Rocephin and Zithromax.  Patient was treated with dosy  Streptococcus pneumoniae bacteremia-likely secondary to pneumonia, transthoracic echocardiogram did not show vegetation.  She completed ceftriaxone treatment on 07/24/2020.  Acute kidney injury on CKD stage IIIb-improved, patient presented with creatinine of 4.76 on admission.  Her creatinine had improved -outpatient follow up  Guaiac positive stool-patient has history of internal hemorrhoids and diverticulosis on recent colonoscopy in November 2021.  Hemoglobin is stable  -outpatient follow up  COPD-stable, continue DuoNeb, albuterol as needed.  Patient is currently on 4 L/min of oxygen via nasal cannula.  has been on O2 in past but concentrator broke  Hypomagnesemia-repleted  Hypokalemia-repleted  ?  OSA/OHS-outpatient sleep study recommended.  Diabetes mellitus type 2-hemoglobin A1c 11.8 on 07/11/2020.  -use 70/30  Normocytic anemia-Baseline hemoglobin around 12 in November 2021.   -trend  Lung nodule-6 mm right upper lobe nodule noted.  Recommendation to repeat noncontrast CT at 6 to 12 months.   Thyroid nodule-2.1 cm in diameter with calcification, recommended to obtain nonemergent thyroid ultrasound.   Thrush.  Improved with Magic mouthwash for 7 days.      Medical Consultants:   Critical care   Discharge Exam:   Vitals:   07/31/20 0659 07/31/20 0900  BP: (!) 127/58   Pulse: 93   Resp: (!) 21   Temp:  98 F (36.7 C)   SpO2: 95% 96%   Vitals:   07/30/20 2319 07/31/20 0313 07/31/20 0659 07/31/20 0900  BP: (!) 124/59 125/66 (!) 127/58   Pulse: 93 92 93   Resp: 20 17 (!) 21   Temp: 98.2 F (36.8 C) 98.3 F (36.8 C) 98 F (36.7 C)   TempSrc: Oral Oral Oral   SpO2: 92% 98% 95% 96%  Weight:  98.4 kg    Height:        General exam: Appears calm and comfortable.   The results of significant diagnostics from this hospitalization (including imaging, microbiology, ancillary and laboratory) are listed below for reference.     Procedures and Diagnostic Studies:   DG Chest Portable 1 View  Result Date: 07/11/2020 CLINICAL DATA:  Initial evaluation for acute cough. History of COPD, CHF. EXAM: PORTABLE CHEST 1 VIEW COMPARISON:  Prior radiograph from 05/23/2017. FINDINGS: Cardiac and mediastinal silhouettes are stable in size and contour, and remain within normal limits. Lungs are hypoinflated. Hazy and patchy opacity seen throughout the right mid and lower lung, concerning for multifocal pneumonia. Left lung is largely clear. No pulmonary edema or visible pleural effusion. No pneumothorax. No acute osseous finding. IMPRESSION: Hazy and patchy opacity throughout the right mid and lower lung, concerning for multifocal pneumonia. Electronically Signed   By: Jeannine Boga M.D.   On: 07/11/2020 18:05   ECHOCARDIOGRAM COMPLETE  Result Date: 07/12/2020    ECHOCARDIOGRAM REPORT   Patient Name:   Annette Gibson Date of Exam: 07/12/2020 Medical Rec #:  [id number]         Height:       58.0 in Accession #:    [id number]        Weight:       215.8 lb Date of Birth:  12-22-1955        BSA:          1.881 m Patient Age:    65 years          BP:           118/62 mmHg Patient Gender: F                 HR:           89 bpm. Exam Location:  Inpatient Procedure: 2D Echo, Color Doppler and Cardiac Doppler Indications:    [place] respiratory distress R06.03  History:        Patient has prior history of  Echocardiogram examinations, most                 recent 05/24/2017. CHF, COPD; Risk Factors:Diabetes.  Sonographer:    Bernadene Person RDCS Referring Phys: [id number] [place]  1. Left ventricular ejection fraction, by estimation, is 60 to 65%. The left ventricle has normal function. The left ventricle has no regional wall motion abnormalities. Left ventricular diastolic parameters were normal.  2. Right ventricular systolic function is moderately reduced. The right ventricular size is moderately enlarged. There is mildly elevated pulmonary artery systolic pressure.  3. The mitral valve is normal in structure. Trivial mitral valve regurgitation. No evidence of mitral stenosis.  4. The aortic valve is tricuspid. Aortic valve regurgitation  is not visualized. No aortic stenosis is present.  5. The inferior vena cava is dilated in size with <50% respiratory variability, suggesting right atrial pressure of 15 mmHg. FINDINGS  Left Ventricle: Left ventricular ejection fraction, by estimation, is 60 to 65%. The left ventricle has normal function. The left ventricle has no regional wall motion abnormalities. The left ventricular internal cavity size was normal in size. There is  no left ventricular hypertrophy. Left ventricular diastolic parameters were normal. Right Ventricle: The right ventricular size is moderately enlarged. No increase in right ventricular wall thickness. Right ventricular systolic function is moderately reduced. There is mildly elevated pulmonary artery systolic pressure. The tricuspid regurgitant velocity is 2.65 m/s, and with an assumed right atrial pressure of 8 mmHg, the estimated right ventricular systolic pressure is 48.1 mmHg. Left Atrium: Left atrial size was normal in size. Right Atrium: Right atrial size was normal in size. Pericardium: There is no evidence of pericardial effusion. Mitral Valve: The mitral valve is normal in structure. There is mild thickening of the mitral  valve leaflet(s). There is mild calcification of the mitral valve leaflet(s). Mild mitral annular calcification. Trivial mitral valve regurgitation. No evidence  of mitral valve stenosis. Tricuspid Valve: The tricuspid valve is normal in structure. Tricuspid valve regurgitation is mild . No evidence of tricuspid stenosis. Aortic Valve: The aortic valve is tricuspid. Aortic valve regurgitation is not visualized. No aortic stenosis is present. Pulmonic Valve: The pulmonic valve was normal in structure. Pulmonic valve regurgitation is not visualized. No evidence of pulmonic stenosis. Aorta: The aortic root is normal in size and structure. Venous: The inferior vena cava is dilated in size with less than 50% respiratory variability, suggesting right atrial pressure of 15 mmHg. IAS/Shunts: No atrial level shunt detected by color flow Doppler.  LEFT VENTRICLE PLAX 2D LVIDd:         4.70 cm  Diastology LVIDs:         3.00 cm  LV e' medial:    7.22 cm/s LV PW:         1.00 cm  LV E/e' medial:  12.8 LV IVS:        0.70 cm  LV e' lateral:   7.78 cm/s LVOT diam:     2.00 cm  LV E/e' lateral: 11.9 LV SV:         57 LV SV Index:   31 LVOT Area:     3.14 cm  RIGHT VENTRICLE RV S prime:     11.90 cm/s TAPSE (M-mode): 2.3 cm LEFT ATRIUM             Index       RIGHT ATRIUM           Index LA diam:        3.10 cm 1.65 cm/m  RA Area:     12.80 cm LA Vol (A2C):   33.5 ml 17.81 ml/m RA Volume:   28.40 ml  15.10 ml/m LA Vol (A4C):   38.8 ml 20.63 ml/m LA Biplane Vol: 36.4 ml 19.35 ml/m  AORTIC VALVE LVOT Vmax:   95.95 cm/s LVOT Vmean:  69.600 cm/s LVOT VTI:    0.183 m  AORTA Ao Root diam: 2.90 cm Ao Asc diam:  2.70 cm MITRAL VALVE                TRICUSPID VALVE MV Area (PHT): 4.04 cm     TR Peak grad:   28.1 mmHg MV Decel Time: 188 msec  TR Vmax:        265.00 cm/s MV E velocity: 92.40 cm/s MV A velocity: 109.00 cm/s  SHUNTS MV E/A ratio:  0.85         Systemic VTI:  0.18 m                             Systemic Diam: 2.00 cm  Jenkins Rouge MD Electronically signed by Jenkins Rouge MD Signature Date/Time: 07/12/2020/11:30:50 AM    Final      Labs:   Basic Metabolic Panel: Recent Labs  Lab 07/27/20 0500 07/28/20 0417 07/29/20 0325 07/30/20 0320 07/31/20 0318  NA 141 140 141 142 142  K 4.3 4.1 4.1 4.3 4.6  CL 97* 97* 99 100 102  CO2 34* 35* 34* 32 31  GLUCOSE 104* 131* 161* 134* 114*  BUN _0 CREATININE 1.34* 1.44* 1.61* 1.65* 1.58*  CALCIUM 8.9 8.8* 9.1 8.8* 8.8*   GFR Estimated Creatinine Clearance: 36.3 mL/min (A) (by C-G formula based on SCr of 1.58 mg/dL (H)). Liver Function Tests: No results for input(s): AST, ALT, ALKPHOS, BILITOT, PROT, ALBUMIN in the last 168 hours. No results for input(s): LIPASE, AMYLASE in the last 168 hours. No results for input(s): AMMONIA in the last 168 hours. Coagulation profile No results for input(s): INR, PROTIME in the last 168 hours.  CBC: Recent Labs  Lab 07/27/20 0500 07/28/20 0417 07/29/20 0325 07/30/20 0320 07/31/20 0318  WBC 7.4 7.0 7.4 7.6 9.1  HGB 7.5* 7.5* 7.6* 7.6* 8.3*  HCT 25.2* 25.3* 24.8* 25.5* 28.2*  MCV 102.0* 102.0* 100.4* 102.0* 102.5*  PLT 162 229 280 344 402*   Cardiac Enzymes: No results for input(s): CKTOTAL, CKMB, CKMBINDEX, TROPONINI in the last 168 hours. BNP: Invalid input(s): POCBNP CBG: Recent Labs  Lab 07/30/20 1041 07/30/20 1602 07/30/20 2106 07/31/20 0557 07/31/20 1051  GLUCAP 162* 206* 174* 117* 195*   D-Dimer No results for input(s): DDIMER in the last 72 hours. Hgb A1c No results for input(s): HGBA1C in the last 72 hours. Lipid Profile No results for input(s): CHOL, HDL, LDLCALC, TRIG, CHOLHDL, LDLDIRECT in the last 72 hours. Thyroid function studies No results for input(s): TSH, T4TOTAL, T3FREE, THYROIDAB in the last 72 hours.  Invalid input(s): FREET3 Anemia work up No results for input(s): VITAMINB12, FOLATE, FERRITIN, TIBC, IRON, RETICCTPCT in the last 72 hours. Microbiology No results  found for this or any previous visit (from the past 240 hour(s)).   Discharge Instructions:   Discharge Instructions    Diet Carb Modified   Complete by: As directed    Increase activity slowly   Complete by: As directed      Allergies as of 07/31/2020      Reactions   Celebrex [celecoxib] Other (See Comments)   Caused seizure   Sulfa Antibiotics Shortness Of Breath   Voltaren [diclofenac Sodium] Other (See Comments)   Caused seizure      Medication List    STOP taking these medications   carvedilol 6.25 MG tablet Commonly known as: COREG   glipiZIDE 5 MG tablet Commonly known as: GLUCOTROL   losartan 25 MG tablet Commonly known as: COZAAR   metFORMIN 1000 MG tablet Commonly known as: GLUCOPHAGE     TAKE these medications   albuterol 108 (90 Base) MCG/ACT inhaler Commonly known as: VENTOLIN HFA Inhale 1 puff into the lungs every 6 (six) hours as needed for wheezing or shortness  of breath. What changed: how much to take   albuterol (2.5 MG/3ML) 0.083% nebulizer solution Commonly known as: PROVENTIL Take 3 mLs (2.5 mg total) by nebulization every 6 (six) hours as needed for wheezing or shortness of breath. What changed: Another medication with the same name was changed. Make sure you understand how and when to take each.   atorvastatin 40 MG tablet Commonly known as: LIPITOR Take 1 tablet (40 mg total) by mouth daily at 6 PM. What changed:   how much to take  how to take this  when to take this   buPROPion 150 MG 12 hr tablet Commonly known as: WELLBUTRIN SR Take 150 mg by mouth daily.   fenofibrate 145 MG tablet Commonly known as: TRICOR Take 1 tablet (145 mg total) by mouth daily. What changed: when to take this   ferrous sulfate 325 (65 FE) MG tablet Take 1 tablet (325 mg total) by mouth daily with breakfast. Start taking on: August 01, 2020   furosemide 40 MG tablet Commonly known as: LASIX Take 20 mg by mouth as needed for fluid or edema (in  the evening). What changed: Another medication with the same name was changed. Make sure you understand how and when to take each.   furosemide 40 MG tablet Commonly known as: LASIX Take 1 tablet (40 mg total) by mouth daily. Start taking on: August 02, 2020 What changed:   how much to take  how to take this  when to take this  additional instructions  These instructions start on August 02, 2020. If you are unsure what to do until then, ask your doctor or other care provider.   guaiFENesin 600 MG 12 hr tablet Commonly known as: MUCINEX Take 1 tablet (600 mg total) by mouth 2 (two) times daily.   Insulin Pen Needle 32G X 4 MM Misc For BID dosing   ipratropium-albuterol 0.5-2.5 (3) MG/3ML Soln Commonly known as: DUONEB Take 3 mLs by nebulization 2 (two) times daily.   NovoLOG 70/30 FlexPen ReliOn (70-30) 100 UNIT/ML FlexPen Generic drug: insulin aspart protamine - aspart Inject 0.15 mLs (15 Units total) into the skin 2 (two) times daily.            Durable Medical Equipment  (From admission, onward)         Start     Ordered   07/27/20 0700  For home use only DME 3 n 1  Once        07/27/20 0528          Follow-up Information    [facility] Follow up.   Specialty: [place] Why: the office will call to schedule home health visits Contact information: [place] [address] [phone]        [facility]. Call.   Why: with questions about oxygen Contact information: [phone]       Bridget Hartshorn, NP Follow up in 1 week(s).   Specialty: Adult Health Nurse Practitioner Why: bmp Contact information: [place] [id number] [phone]                Time coordinating discharge: 35 min  Signed:  Geradine Girt DO  [facility] 07/31/2020, 2:10 PM

## 2020-07-31 NOTE — Progress Notes (Signed)
Physical Therapy Treatment Patient Details Name: Annette Gibson. Korf MRN: [id number] DOB: 1956/02/10 Today's Date: 07/31/2020    History of Present Illness 65 yo admitted 2/8 with SOB and lethargy reporting poor PO intake after getting sick from [facility]. Pt with CAP requiring Bipap. PMhx: COPD, CHF, CKD, DM, anxiety    PT Comments    Pt fully participated in session. PT monitoring SpO2 during ambulation. Pt with significant drop in O2 while sitting/standing on RA. Pt requiring increased O2 during ambulation to maintain SpO2> 88%. Pt demonstrating balance and gait deficits during ambulation. Pt will continue to benefit from skilled PT to progress balance, gait and endurance to maximize independence with functional mobility prior to discharge.     Follow Up Recommendations  Home health PT     Equipment Recommendations  None recommended by PT    Recommendations for Other Services       Precautions / Restrictions Precautions Precautions: Fall Precaution Comments: watch sats/ HR, HFNC Restrictions Weight Bearing Restrictions: No    Mobility  Bed Mobility                    Transfers Overall transfer level: Modified independent Equipment used: None Transfers: Sit to/from Stand              Ambulation/Gait Ambulation/Gait assistance: Min guard Gait Distance (Feet): 150 Feet Assistive device: None Gait Pattern/deviations: Step-through pattern;Decreased stride length     General Gait Details: S-min guard for safety. Pt with decreased weight bearing through R LE due to pain in her hip from previous surgery   Stairs             Wheelchair Mobility    Modified Rankin (Stroke Patients Only)       Balance Overall balance assessment: Mild deficits observed, not formally tested                                          Cognition                                              Exercises      General Comments General  comments (skin integrity, edema, etc.): Ambulating 3 L SpO2 drop to 85% increase to 4 L able to ambulate 100 ft at 88-90% then dropped to 81%, increase to 6L and able to maintain > 88%; Pt will need home oxygen to maintain appropriate SpO2 during functional mobility tasks      Pertinent Vitals/Pain      Home Living                      Prior Function            PT Goals (current goals can now be found in the care plan section) Acute Rehab PT Goals Patient Stated Goal: return home PT Goal Formulation: With patient Time For Goal Achievement: 07/30/20 Potential to Achieve Goals: Good Progress towards PT goals: Progressing toward goals    Frequency    Min 3X/week      PT Plan Current plan remains appropriate    Co-evaluation              AM-PAC PT "6 Clicks" Mobility   Outcome Measure  Help needed turning  from your back to your side while in a flat bed without using bedrails?: None Help needed moving from lying on your back to sitting on the side of a flat bed without using bedrails?: None Help needed moving to and from a bed to a chair (including a wheelchair)?: A Little Help needed standing up from a chair using your arms (e.g., wheelchair or bedside chair)?: None Help needed to walk in hospital room?: A Little Help needed climbing 3-5 steps with a railing? : A Lot 6 Click Score: 20    End of Session Equipment Utilized During Treatment: Oxygen;Gait belt Activity Tolerance: Patient tolerated treatment well Patient left: with call bell/phone within reach;in chair Nurse Communication: Mobility status PT Visit Diagnosis: Other abnormalities of gait and mobility (R26.89);Difficulty in walking, not elsewhere classified (R26.2)     Time: 0165-5374 PT Time Calculation (min) (ACUTE ONLY): 16 min  Charges:  $Therapeutic Exercise: 8-22 mins                     Lyanne Co, DPT Acute Rehabilitation Services [phone]   [place] 07/31/2020, 12:37  PM

## 2020-07-31 NOTE — Progress Notes (Signed)
SATURATION QUALIFICATIONS: (This note is used to comply with regulatory documentation for home oxygen)  Patient Saturations on Room Air at Rest = 88%  Patient saturation at 2.5 liters at rest = 93%  Please briefly explain why patient needs home oxygen:Pt needs oxygen at rest

## 2020-07-31 NOTE — Progress Notes (Signed)
Occupational Therapy Treatment Patient Details Name: Annette Gibson MRN: [id number] DOB: [birth year] Today's Date: 07/31/2020    History of present illness 65 yo admitted 2/8 with SOB and lethargy reporting poor PO intake after getting sick from [facility]. Pt with CAP requiring Bipap. PMhx: COPD, CHF, CKD, DM, anxiety   OT comments  Pt making excellent progress, able to demonstrate ADL at supervision/mod I and ambulate in hallway - Pt did require increased oxygen support from 2-6L to maintain SpO2 >90% throughout hallway ambulation. OT will continue to follow acutely and Pt will benefit from [facility] post-acute.   Follow Up Recommendations  Home health OT    Equipment Recommendations  3 in 1 bedside commode    Recommendations for Other Services      Precautions / Restrictions Precautions Precautions: Fall Precaution Comments: watch sats, HR Restrictions Weight Bearing Restrictions: No       Mobility Bed Mobility               General bed mobility comments: In recliner at beginning and end of session    Transfers Overall transfer level: Needs assistance Equipment used: None Transfers: Sit to/from Stand Sit to Stand: Supervision              Balance Overall balance assessment: Needs assistance   Sitting balance-Leahy Scale: Good     Standing balance support: No upper extremity supported;During functional activity Standing balance-Leahy Scale: Good                             ADL either performed or assessed with clinical judgement   ADL Overall ADL's : Needs assistance/impaired     Grooming: Wash/dry hands;Supervision/safety;Standing Grooming Details (indicate cue type and reason): at sink                 Toilet Transfer: Supervision/safety;Ambulation   Toileting- Clothing Manipulation and Hygiene: Modified independent       Functional mobility during ADLs: Supervision/safety       Vision       Perception     Praxis       Cognition Arousal/Alertness: Awake/alert Behavior During Therapy: WFL for tasks assessed/performed Overall Cognitive Status: Within Functional Limits for tasks assessed                                          Exercises     Shoulder Instructions       General Comments Ambulating 3 L SpO2 drop to 85% increase to 6 L with extended activity, HR elevated to 133    Pertinent Vitals/ Pain       Pain Assessment: Faces Faces Pain Scale: No hurt Pain Intervention(s): Monitored during session  Home Living                                          Prior Functioning/Environment              Frequency  Min 2X/week        Progress Toward Goals  OT Goals(current goals can now be found in the care plan section)  Progress towards OT goals: Progressing toward goals  Acute Rehab OT Goals Patient Stated Goal: return home OT Goal Formulation: With patient Time For Goal Achievement:  07/31/20 Potential to Achieve Goals: Good  Plan Discharge plan remains appropriate;Frequency remains appropriate    Co-evaluation                 AM-PAC OT "6 Clicks" Daily Activity     Outcome Measure   Help from another person eating meals?: None Help from another person taking care of personal grooming?: None Help from another person toileting, which includes using toliet, bedpan, or urinal?: A Little Help from another person bathing (including washing, rinsing, drying)?: A Little Help from another person to put on and taking off regular upper body clothing?: None Help from another person to put on and taking off regular lower body clothing?: A Little 6 Click Score: 21    End of Session Equipment Utilized During Treatment: Oxygen (2-6L via NRB)  OT Visit Diagnosis: Unsteadiness on feet (R26.81);Muscle weakness (generalized) (M62.81)   Activity Tolerance Patient tolerated treatment well   Patient Left in chair;with nursing/sitter in room    Nurse Communication Mobility status        Time: 1400-1435 OT Time Calculation (min): 35 min  Charges: OT General Charges $OT Visit: 1 Visit OT Treatments $Self Care/Home Management : 8-22 mins $Therapeutic Activity: 8-22 mins  Jesse Sans OTR/L Acute Rehabilitation Services Pager: [phone] Office: [place] 07/31/2020, 3:28 PM

## 2020-07-31 NOTE — Plan of Care (Signed)

## 2020-08-08 NOTE — Progress Notes (Signed)
Referring Provider: Bridget Hartshorn, NP Primary Care Physician:  Bridget Hartshorn, NP Primary GI Physician: Dr. Abbey Chatters  Chief Complaint  Patient presents with  . early cirrhosis    Recently hospitalized    HPI:   Annette Gibson is a 65 y.o. female presenting today for follow-up of positive Cologuard s/p colonoscopy and elevated ALT. Patient was last seen in our office 02/11/2020 at the time of initial consult for the same.  She had no significant upper or lower GI symptoms.  No overt GI bleeding. She had mild elevation of ALT at 53 March 2021, down to 33 in June 2021.  Reported alcohol "once in a blue moon", no history of drug use, no tattoos.  No regular Tylenol.  No signs or symptoms of decompensated liver disease.  We discussed obtaining additional labs to evaluate for hepatitis, hemochromatosis, and an ultrasound.  Patient preferred not to have work-up until I reviewed recent labs completed with PCP. She was scheduled for colonoscopy.   Received labs which included BMP and UA.  No LFTs.  Recommended HFP, hepatitis A, B, C labs, iron panel, and abdominal ultrasound.   Labs 03/10/20 with hemoglobin 11.2 (L) with normocytic indices.  This is down from 14.7 in March 2021.  Platelets within normal limits. Creatinine 3.51 (H), sodium 145 (H), potassium 5.4 (H). LFTs within normal limits. Hepatitis B and C testing negative.  No immunity to hepatitis B.  Hepatitis A antibody negative. Iron panel with ferritin 187 (H), iron saturation 22%, iron 86.  Follow-up labs with INR within normal limits.   US abdomen: Mild contour irregularity of the liver suggesting possible cirrhosis, mildly increased hepatic echogenicity.  Spleen normal. Elastography: kPa 2.6.  Recommended correlating with FibroSure.  Colonoscopy 05/02/2020: Nonbleeding internal hemorrhoids, multiple diverticula in the sigmoid and descending colon, 2 mm polyp in the ascending colon, 5 mm polyp in ascending colon, 4 mm polyp  in the descending colon, 1 medium sized angiodysplastic lesion without bleeding in the ascending colon.  Pathology with tubular adenomas.  Recommended repeat colonoscopy in 5 years.    Recently admitted 07/11/2020-07/31/2020 at [facility] with acute respiratory failure in setting of pneumonia due to Streptococcus pneumonia, pneumococcal bacteremia, severe sepsis.  Also with acute on chronic kidney disease and DKA.  She was treated with Rocephin and Zithromax.  She initially required 20 L of oxygen, down to 4 L at time of discharge.  Other findings during admission included a 6 mm right upper lobe lung nodule with recommendations to repeat noncontrast CT in 6-12 months.  Also with 2.1 cm thyroid nodule with calcification with recommendations to obtain nonemergent thyroid ultrasound. Also with hemoglobin 10.8 on admission, down from 12.1 in November 2021. Hemoglobin declined as low as 7.2 during admission, hemoglobin 8.3 on discharge. MCV 102.5 (H). Heme positive stool. Iron panel wnl. LFTs during hospitalization were normal.   Today:   Anemia: No brbpr or melena. No hematuria. No vaginal bleeding. Started iron while in the hospital. No NSAIDs in a couple of years. No nausea, vomiting, abdominal pain. Occasional heartburn. Occurs every couple days after eating. Started over the last few months to a year. Taking Gaviscon as needed. No dysphagia.  Has never been on a PPI. No fried/fatty/greasy. No soda but she drinks sparkling water.   Elevated LFTs:    No swelling in the lower extremities or abdomen, confusion, yellowing of eyes or skin, bruising or bleeding. She is down 7 pounds over the last 6 months  according to our scales.  Denies any regular Tylenol use or over-the-counter supplements.  On supplemental O2, currently using 3L continuously. This keeps her at 93-95%. No SOB. Coughing a little, but not productive. Not quite back to her baseline.  States she used to be on oxygen, but her machine was broken at  home prior to her recent admission. Blood sugars are still up and down. Around 200-220 most of the time.    Past Medical History:  Diagnosis Date  . Anal warts    anal warts  . Anxiety   . CKD (chronic kidney disease)   . Congestive heart failure (CHF) ([place])   . COPD (chronic obstructive pulmonary disease) ([place])   . Diabetes mellitus without complication ([place])   . Hypertriglyceridemia   . Obesity    metabolic syndrome   . Osteopenia   . Vitamin D deficiency     Past Surgical History:  Procedure Laterality Date  . ABDOMINAL HYSTERECTOMY    . CATARACT EXTRACTION Bilateral   . COLONOSCOPY WITH PROPOFOL N/A 05/02/2020   Surgeon: Eloise Harman, DO; nonbleeding internal hemorrhoids, multiple diverticula in the sigmoid and descending colon, 3 tubular adenomas, one medium size angiodysplastic lesion without bleeding in the ascending colon.  Recommended repeat in 5 years.  . Excision of perineal condyloma acuminata  01/12/2020  . FRACTURE SURGERY     Right leg and ankle  . lt ganglion cyst removal Bilateral 09/2007  . POLYPECTOMY  05/02/2020   Procedure: POLYPECTOMY;  Surgeon: Eloise Harman, DO;  Location: [facility];  Service: Endoscopy;;    Current Outpatient Medications  Medication Sig Dispense Refill  . albuterol (PROVENTIL HFA;VENTOLIN HFA) 108 (90 Base) MCG/ACT inhaler Inhale 1 puff into the lungs every 6 (six) hours as needed for wheezing or shortness of breath. (Patient taking differently: Inhale 2 puffs into the lungs every 6 (six) hours as needed for wheezing or shortness of breath.) 1 Inhaler 1  . atorvastatin (LIPITOR) 40 MG tablet Take 1 tablet (40 mg total) by mouth daily at 6 PM. (Patient taking differently: Take 40 mg by mouth at bedtime. Take 1 tablet (40 mg total) by mouth daily at 6 PM.) 30 tablet 5  . buPROPion (WELLBUTRIN SR) 150 MG 12 hr tablet Take 150 mg by mouth daily.    . fenofibrate (TRICOR) 145 MG tablet Take 1 tablet (145 mg total) by mouth daily.  (Patient taking differently: Take 145 mg by mouth at bedtime.) 30 tablet 5  . ferrous sulfate 325 (65 FE) MG tablet Take 1 tablet (325 mg total) by mouth daily with breakfast.  3  . furosemide (LASIX) 40 MG tablet Take 20 mg by mouth as needed for fluid or edema (in the evening).    . furosemide (LASIX) 40 MG tablet Take 1 tablet (40 mg total) by mouth daily.    Marland Kitchen guaiFENesin (MUCINEX) 600 MG 12 hr tablet Take 1 tablet (600 mg total) by mouth 2 (two) times daily.    . insulin aspart protamine - aspart (NOVOLOG 70/30 FLEXPEN RELION) (70-30) 100 UNIT/ML FlexPen Inject 0.15 mLs (15 Units total) into the skin 2 (two) times daily. 15 mL 0  . Insulin Pen Needle 32G X 4 MM MISC For BID dosing 90 each 2  . pantoprazole (PROTONIX) 40 MG tablet Take 1 tablet (40 mg total) by mouth daily before breakfast. 30 tablet 3  . albuterol (PROVENTIL) (2.5 MG/3ML) 0.083% nebulizer solution Take 3 mLs (2.5 mg total) by nebulization every 6 (six)  hours as needed for wheezing or shortness of breath. (Patient not taking: Reported on 08/10/2020) 150 mL 1  . ipratropium-albuterol (DUONEB) 0.5-2.5 (3) MG/3ML SOLN Take 3 mLs by nebulization 2 (two) times daily. (Patient not taking: Reported on 08/10/2020) 360 mL 0   No current facility-administered medications for this visit.    Allergies as of 08/10/2020 - Review Complete 08/10/2020  Allergen Reaction Noted  . Celebrex [celecoxib] Other (See Comments) 10/23/2010  . Sulfa antibiotics Shortness Of Breath 11/26/2018  . Voltaren [diclofenac sodium] Other (See Comments) 05/06/2014    Family History  Problem Relation Age of Onset  . Pneumonia Mother   . Diabetes Mother   . Colon cancer Neg Hx     Social History   Socioeconomic History  . Marital status: Single    Spouse name: Not on file  . Number of children: Not on file  . Years of education: Not on file  . Highest education level: Not on file  Occupational History  . Not on file  Tobacco Use  . Smoking status:  Former Smoker    Packs/day: 0.50    Years: 50.00    Pack years: 25.00    Types: Cigarettes    Quit date: 06/04/2019    Years since quitting: 1.1  . Smokeless tobacco: Never Used  Vaping Use  . Vaping Use: Never used  Substance and Sexual Activity  . Alcohol use: No  . Drug use: No  . Sexual activity: Yes  Other Topics Concern  . Not on file  Social History Narrative  . Not on file   Social Determinants of Health   Financial Resource Strain: Not on file  Food Insecurity: Not on file  Transportation Needs: Not on file  Physical Activity: Not on file  Stress: Not on file  Social Connections: Not on file    Review of Systems: Gen: Denies fever, chills, cold or flulike symptoms, lightheadedness, dizziness, presyncope, syncope.  CV: Denies chest pain or palpitations. Resp: See HPI GI: See HPI Heme: See HPI  Physical Exam: BP 140/75   Pulse 97   Temp (!) 96.8 F (36 C) (Temporal)   Ht _0  (1.626 m)   Wt 200 lb 12.8 oz (91.1 kg)   BMI 34.47 kg/m  General:   Alert and oriented. No distress noted. Pleasant and cooperative.  Has O2 tank on 3 L with nasal cannula in place. Head:  Normocephalic and atraumatic. Eyes:  Conjuctiva clear without scleral icterus. Heart:  S1, S2 present without murmurs appreciated. Lungs:  Clear to auscultation bilaterally. No wheezes, rales, or rhonchi. No distress.  Abdomen:  +BS, soft, non-tender and non-distended. No rebound or guarding. No HSM or masses noted. Msk:  Symmetrical without gross deformities. Normal posture. Extremities:  Without edema. Neurologic:  Alert and  oriented x4 Psych: Normal mood and affect.   Assessment: 65 year old female presenting today for follow-up of elevated LFTs. Also with reports of heartburn and recently found to have anemia with heme positive stool during 3 week admission to [facility] in February 2022 with severe sepsis, acute respiratory failure in the setting of pneumonia and pneumococcal bacteremia as  well as CKD and DKA. No GI evaluation during hospitalization.  Elevated LFTs: History of mild intermittent LFT elevation dating back to December 2018.  I have not identified a recurrent causative factor occurring prior to each mild elevation.  Denies any significant alcohol use, regular Tylenol use, OTC supplements, or herbal teas.  Evaluation thus far has included hepatitis  A, B, and C all negative.  Iron panel with ferritin 187 (H), iron saturation 22%. Ferritin normalized in November 2021. Ultrasound abdomen October 2021 with mild contour irregularity suggesting possible cirrhosis, mildly increased hepatic echogenicity, normal spleen.  Follow-up elastography with kPa 2.6, suggesting high probability of being normal.  Recommended correlating with FibroSure, but this has not yet been completed.  Interestingly, during recent 3-week hospitalization in February at [facility], LFTs were completely normal. Glipizide and metformin were discontinued during hospitalization.  Etiology of intermittent mild LFT elevation not entirely clear.  Query possible med effect.  Also likely with underlying fatty liver disease influenced by uncontrolled diabetes and obesity.  Suspect possible early cirrhosis may have been an overcall on prior ultrasound.  She has no signs or symptoms of decompensated liver disease.  We will plan to recheck LFTs and obtain fibrosure.  If LFTs become elevated again, will go ahead and complete additional serologic evaluation of autoimmune processes, alpha-1 antitrypsin deficiency, Wilson's disease.   Heartburn: Patient reports 6 months-1 year history of heartburn occurring every couple of days after eating.  Denies dysphagia.  Currently using Gaviscon as needed.  Has never been on a PPI.  We will plan to start her on Protonix 40 mg daily.  She is also counseled on GERD diet/lifestyle.  Anemia with heme positive stool: Found to have anemia during recent 3-week hospitalization to [facility] where she  was hospitalized with severe sepsis, acute respiratory failure, pneumonia, pneumococcal bacteremia, CKD, and DKA.  Hemoglobin declined as low as 7.2 during admission.  No blood transfusion received.  Hemoglobin 8.3 at the time of discharge.  Notably, stool heme positive 07/23/2020.  Iron panel within normal limits, but with some decline since November 2021. Now on oral iron since hospitalization.  Patient denies overt GI bleeding.  Only significant GI symptom is heartburn as per above.  No regular NSAID use.  Recent colonoscopy November 2021 with nonbleeding internal hemorrhoids, diverticula in the sigmoid and descending colon, 3 tubular adenomas, and one medium sized angiodysplastic lesion without bleeding in the ascending colon.  Etiology of anemia is not clear.  Likely multifactorial with acute illness playing a role.  Could have intermittent oozing from angiodysplastic lesion in the colon.  Also, cannot rule out upper GI/small bowel source.  No prior EGD.  We discussed the possibility of scheduling EGD.  We will hold off on this for now she is still not back to her baseline since hospital discharge.  We will plan to follow-up in 6 weeks to discuss scheduling EGD at that time.   Plan: 1.  Labs including HFP, CBC, fibrosure.  2.  Start Protonix 40 mg daily 30 minutes before breakfast.  3.  Counseled on GERD diet/lifestyle.  Written instructions and handout provided.  4.  Counseled on fatty liver.  Separate written instructions provided.  5.  Advised to discuss hep a and B vaccine with PCP.  6.  Avoid alcohol.  7.  Avoid over-the-counter supplements and herbal teas.  8.  Limit Tylenol.  If needed, no more than 2000 mg/day.  9.  Monitor for bright red blood per rectum or black stools and let us know if this occurs.  10.  Avoid all NSAID products.  11.  Continue oral iron for now.  12.  Follow-up in 6 weeks to discuss scheduling EGD.   Aliene Altes, PA-C [facility]  Gastroenterology

## 2020-08-10 ENCOUNTER — Ambulatory Visit: Payer: Medicare HMO | Admitting: Gastroenterology

## 2020-08-10 ENCOUNTER — Other Ambulatory Visit: Payer: Self-pay

## 2020-08-10 ENCOUNTER — Encounter: Payer: Self-pay | Admitting: Gastroenterology

## 2020-08-10 VITALS — BP 140/75 | HR 97 | Temp 96.8°F | Ht 64.0 in | Wt 200.8 lb

## 2020-08-10 DIAGNOSIS — R7989 Other specified abnormal findings of blood chemistry: Secondary | ICD-10-CM | POA: Diagnosis not present

## 2020-08-10 DIAGNOSIS — R195 Other fecal abnormalities: Secondary | ICD-10-CM

## 2020-08-10 DIAGNOSIS — D649 Anemia, unspecified: Secondary | ICD-10-CM | POA: Diagnosis not present

## 2020-08-10 DIAGNOSIS — R12 Heartburn: Secondary | ICD-10-CM

## 2020-08-10 MED ORDER — PANTOPRAZOLE SODIUM 40 MG PO TBEC: 40.0000 mg | DELAYED_RELEASE_TABLET | Freq: Every day | ORAL | 3 refills | Status: DC

## 2020-08-10 NOTE — Patient Instructions (Addendum)
Please have labs completed with primary care.  I placed orders for the labs that we need. For your FibroSure test, the following information needs to be sent to the lab when you have blood drawn: Patient information: Height:5'4" Weight: 200 lbs Age: 65y.o. Gender: Female  Start Protonix 40 mg daily 30 minutes before breakfast for heartburn.  To prevent heartburn:  Avoid fried, fatty, greasy, spicy, citrus foods. Avoid caffeine and carbonated beverages. Avoid chocolate. Try eating 4-6 small meals a day rather than 3 large meals. Do not eat within 3 hours of laying down. Prop head of bed up on wood or bricks to create a 6 inch incline.  For low hemoglobin: You may continue taking oral iron for now. Monitor for blood in your stools or black stools and let us know if this occurs. Avoid all NSAID products including ibuprofen, Aleve, Advil, Goody powders, BC powders, naproxen, and anything that says "NSAID" on the package.  For elevated liver enzymes: Discuss with your primary care doctor about receiving hepatitis a and B vaccine. We are updating labs to monitor your liver function test. Avoid alcohol. Avoid over-the-counter supplements and herbal teas. Limit Tylenol, but if you need it, do not take more than 2000 mg/day.  Instructions for fatty liver: Recommend 1-2# weight loss per week until ideal body weight through exercise & diet. Low fat/cholesterol diet.   Avoid sweets, sodas, fruit juices, sweetened beverages like tea, etc. Gradually increase exercise from 15 min daily up to 1 hr per day 5 days/week. Continue following closely with PCP to gain better control of your diabetes.   Will plan to see back in about 6 weeks to discuss scheduling an upper endoscopy to further evaluate your low hemoglobin.  Annette Altes, PA-C [facility]    Food Choices for Gastroesophageal Reflux Disease, Adult When you have gastroesophageal reflux disease (GERD), the foods  you eat and your eating habits are very important. Choosing the right foods can help ease your discomfort. Think about working with a food expert (dietitian) to help you make good choices. What are tips for following this plan? Reading food labels  Look for foods that are low in saturated fat. Foods that may help with your symptoms include: ? Foods that have less than 5% of daily value (DV) of fat. ? Foods that have 0 grams of trans fat. Cooking  Do not fry your food.  Cook your food by baking, steaming, grilling, or broiling. These are all methods that do not need a lot of fat for cooking.  To add flavor, try to use herbs that are low in spice and acidity. Meal planning  Choose healthy foods that are low in fat, such as: ? Fruits and vegetables. ? Whole grains. ? Low-fat dairy products. ? Lean meats, fish, and poultry.  Eat small meals often instead of eating 3 large meals each day. Eat your meals slowly in a place where you are relaxed. Avoid bending over or lying down until 2-3 hours after eating.  Limit high-fat foods such as fatty meats or fried foods.  Limit your intake of fatty foods, such as oils, butter, and shortening.  Avoid the following as told by your doctor: ? Foods that cause symptoms. These may be different for different people. Keep a food diary to keep track of foods that cause symptoms. ? Alcohol. ? Drinking a lot of liquid with meals. ? Eating meals during the 2-3 hours before bed.   Lifestyle  Stay at a healthy weight.  Ask your doctor what weight is healthy for you. If you need to lose weight, work with your doctor to do so safely.  Exercise for at least 30 minutes on 5 or more days each week, or as told by your doctor.  Wear loose-fitting clothes.  Do not smoke or use any products that contain nicotine or tobacco. If you need help quitting, ask your doctor.  Sleep with the head of your bed higher than your feet. Use a wedge under the mattress or  blocks under the bed frame to raise the head of the bed.  Chew sugar-free gum after meals. What foods should eat? Eat a healthy, well-balanced diet of fruits, vegetables, whole grains, low-fat dairy products, lean meats, fish, and poultry. Each person is different. Foods that may cause symptoms in one person may not cause any symptoms in another person. Work with your doctor to find foods that are safe for you. The items listed above may not be a complete list of what you can eat and drink. Contact a food expert for more options.   What foods should I avoid? Limiting some of these foods may help in managing the symptoms of GERD. Everyone is different. Talk with a food expert or your doctor to help you find the exact foods to avoid, if any. Fruits Any fruits prepared with added fat. Any fruits that cause symptoms. For some people, this may include citrus fruits, such as oranges, grapefruit, pineapple, and lemons. Vegetables Deep-fried vegetables. [place] fries. Any vegetables prepared with added fat. Any vegetables that cause symptoms. For some people, this may include tomatoes and tomato products, chili peppers, onions and garlic, and horseradish. Grains Pastries or quick breads with added fat. Meats and other proteins High-fat meats, such as fatty beef or pork, hot dogs, ribs, ham, sausage, salami, and bacon. Fried meat or protein, including fried fish and fried chicken. Nuts and nut butters, in large amounts. Dairy Whole milk and chocolate milk. Sour cream. Cream. Ice cream. Cream cheese. Milkshakes. Fats and oils Butter. Margarine. Shortening. Ghee. Beverages Coffee and tea, with or without caffeine. Carbonated beverages. Sodas. Energy drinks. Fruit juice made with acidic fruits, such as orange or grapefruit. Tomato juice. Alcoholic drinks. Sweets and desserts Chocolate and cocoa. Donuts. Seasonings and condiments Pepper. Peppermint and spearmint. Added salt. Any condiments, herbs, or  seasonings that cause symptoms. For some people, this may include curry, hot sauce, or vinegar-based salad dressings. The items listed above may not be a complete list of what you should not eat and drink. Contact a food expert for more options. Questions to ask your doctor Diet and lifestyle changes are often the first steps that are taken to manage symptoms of GERD. If diet and lifestyle changes do not help, talk with your doctor about taking medicines. Where to find more information  [employer] G[employer]intestinal [employer]: aboutgerd.org Summary  When you have GERD, food and lifestyle choices are very important in easing your symptoms.  Eat small meals often instead of 3 large meals a day. Eat your meals slowly and in a place where you are relaxed.  Avoid bending over or lying down until 2-3 hours after eating.  Limit high-fat foods such as fatty meats or fried foods. This information is not intended to replace advice given to you by your health care provider. Make sure you discuss any questions you have with your health care provider. Document Revised: 11/29/2019 Document Reviewed: 11/29/2019 Elsevier Patient Education  [place].

## 2020-08-13 ENCOUNTER — Encounter: Payer: Self-pay | Admitting: Gastroenterology

## 2020-08-18 ENCOUNTER — Telehealth: Payer: Self-pay | Admitting: Gastroenterology

## 2020-08-18 NOTE — Telephone Encounter (Signed)
Reviewed labs in [place] completed 08/17/2018 with PCP.  Hemoglobin remains low at 10.0, but has improved from 8.3 on 07/31/2020.  LFTs within normal limits.  Appears that Fibrosure was not completed.  Dena, please let patient know the above. Ask if she know if PCP was able to complete Fibrosure test. If not, we can arrange for this. The following information has to be provided with the lab in order for Fibrosure to result:   Patient information: Height:5'4" Weight: 200 lbs Age: 65y.o. Gender: Female

## 2020-08-21 ENCOUNTER — Telehealth: Payer: Self-pay | Admitting: Internal Medicine

## 2020-08-21 NOTE — Telephone Encounter (Signed)
Phoned the pt and LM on her vm to return call regarding her labs.

## 2020-08-21 NOTE — Telephone Encounter (Signed)
See other phone note

## 2020-08-21 NOTE — Telephone Encounter (Signed)
Patient returned call, please call back at [phone]

## 2020-08-22 ENCOUNTER — Other Ambulatory Visit: Payer: Self-pay

## 2020-08-22 DIAGNOSIS — K7460 Unspecified cirrhosis of liver: Secondary | ICD-10-CM

## 2020-08-22 DIAGNOSIS — R7989 Other specified abnormal findings of blood chemistry: Secondary | ICD-10-CM

## 2020-08-22 NOTE — Telephone Encounter (Signed)
Phoned the pt's PCP office, spoke with Joelene Millin they put in for the pt to have the NASH FibroSure done but the lab didn't do it for some reason. Re-did the labs with the pt's information written on it.  Phoned and advised the pt of this and that this is a 8 hr fasting lab and (you have to make sure it is fasting), go to the nearest [facility] to have it done (because the pt was going to go back to her PCP or [facility]. I advised the pt that it will be much easier to do [facility] that way we will get the notes without waiting. She agreed. Lab forms put in the mail today along with the instructions about fasting as a reminder. Pt is aware of all of this.

## 2020-08-22 NOTE — Telephone Encounter (Signed)
Phoned and advised the pt of her lab results and asked her about her Fibrosure not being completed. Pt stated she didn't know. States all she know is that they took lots of blood from here. I advised the pt if they did not she would have to get more blood work done. I advised her I would call her back regarding the details once I contact her PCP.

## 2020-09-28 ENCOUNTER — Ambulatory Visit: Payer: Medicare HMO | Admitting: Gastroenterology

## 2020-11-13 ENCOUNTER — Encounter: Payer: Self-pay | Admitting: Gastroenterology

## 2020-11-30 ENCOUNTER — Other Ambulatory Visit: Payer: Self-pay | Admitting: Gastroenterology

## 2020-11-30 DIAGNOSIS — R12 Heartburn: Secondary | ICD-10-CM

## 2020-12-18 ENCOUNTER — Ambulatory Visit: Payer: Medicare HMO | Admitting: Gastroenterology

## 2021-02-12 ENCOUNTER — Encounter: Payer: Self-pay | Admitting: Internal Medicine

## 2021-03-09 ENCOUNTER — Ambulatory Visit: Payer: Medicare HMO | Admitting: Gastroenterology

## 2021-03-19 IMAGING — CT CT CHEST W/O CM
2 of 4 series · 15 of 36 positions shown, 18 images · non-contrast
Comparison: Chest radiograph 07/20/2020

CLINICAL DATA: Respiratory illness, nondiagnostic x-ray; history
chronic kidney disease, CHF, COPD, diabetes mellitus, hypertension,
former smoker

EXAM:
CT CHEST WITHOUT CONTRAST
TECHNIQUE: Multidetector CT imaging of the chest was performed following the
standard protocol without IV contrast. Sagittal and coronal MPR
images reconstructed from axial data set.

[Series 4: thorax 2.0 · axial · 0.68mm/px · z∈[+1177,+1457]mm · 12 of 158 slices shown, 15 images]
[im 9/158  mediastinal]
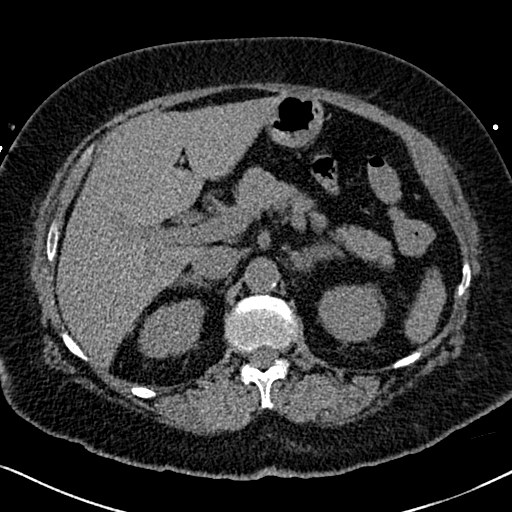
[im 9/158  lung]
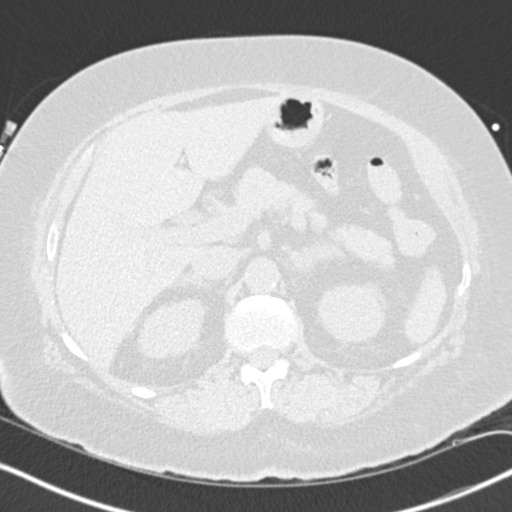
[im 25/158  lung]
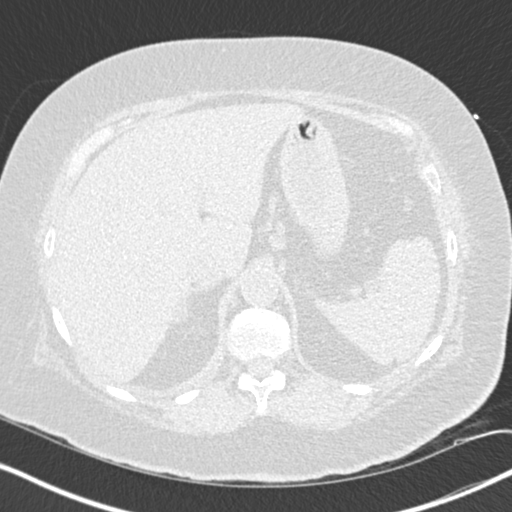
[im 34/158  lung]
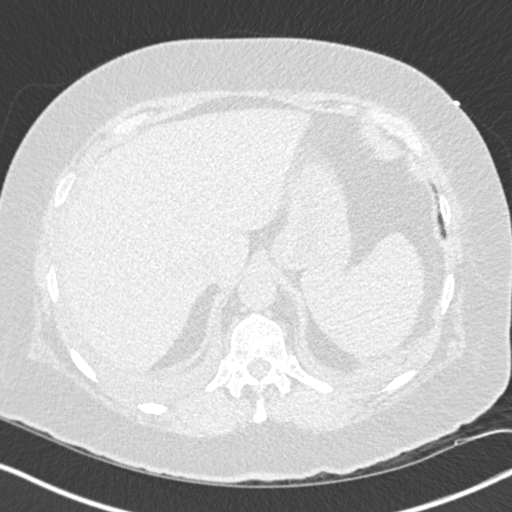
[im 50/158  lung]
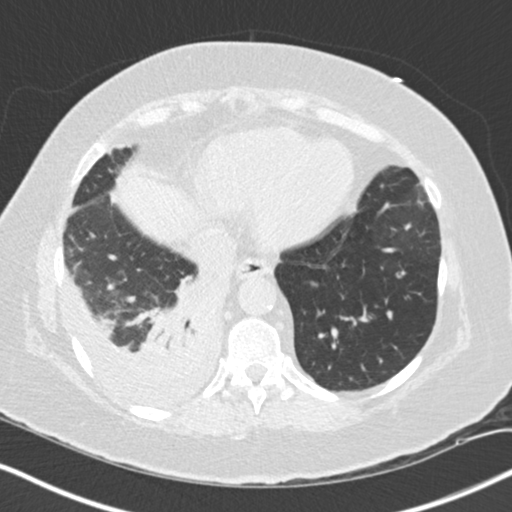
[im 58/158  mediastinal]
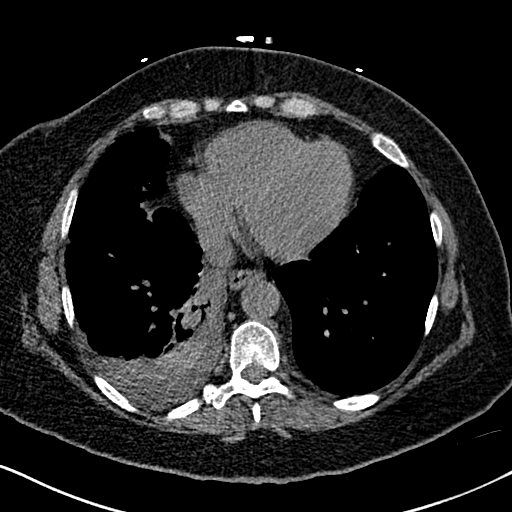
[im 58/158  lung]
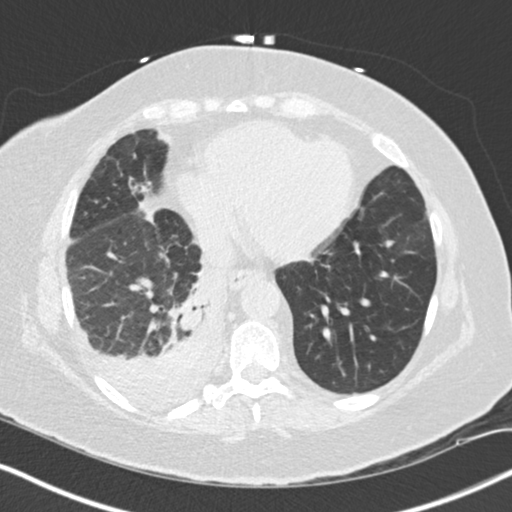
[im 75/158  lung]
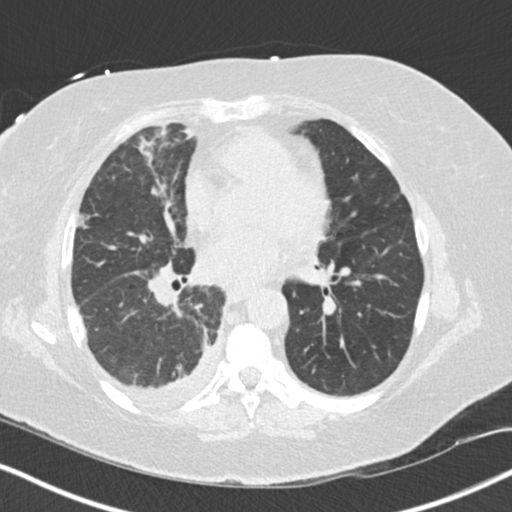
[im 83/158  lung]
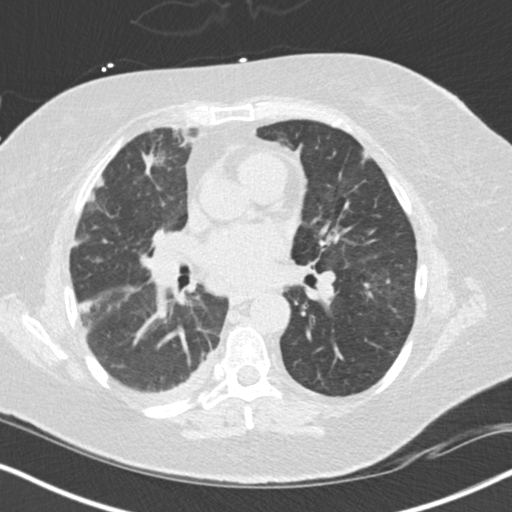
[im 100/158  lung]
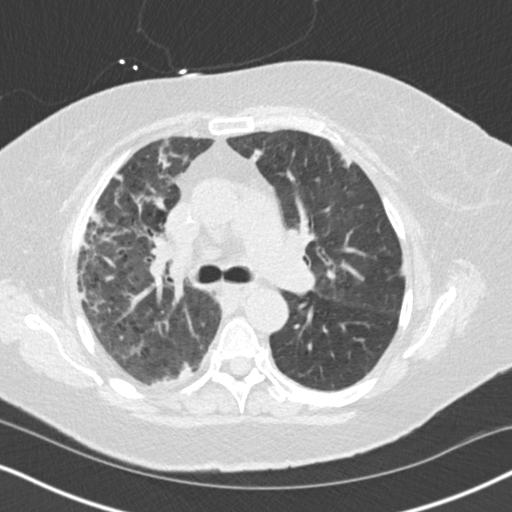
[im 108/158  mediastinal]
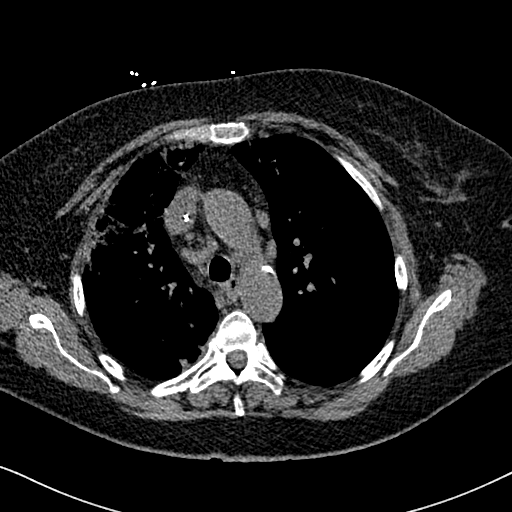
[im 108/158  lung]
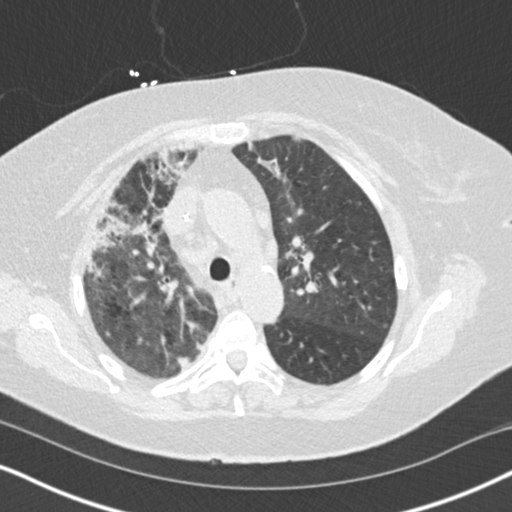
[im 124/158  lung]
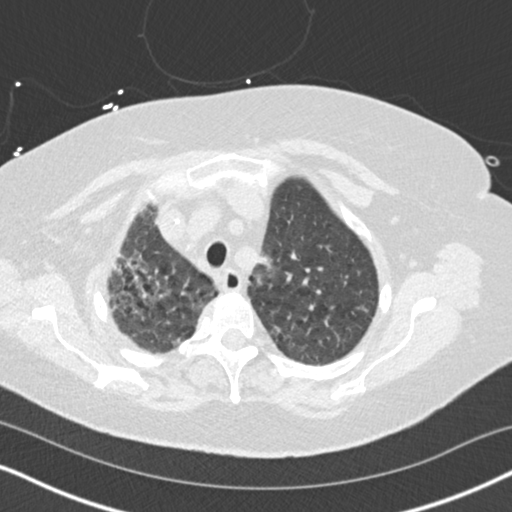
[im 133/158  lung]
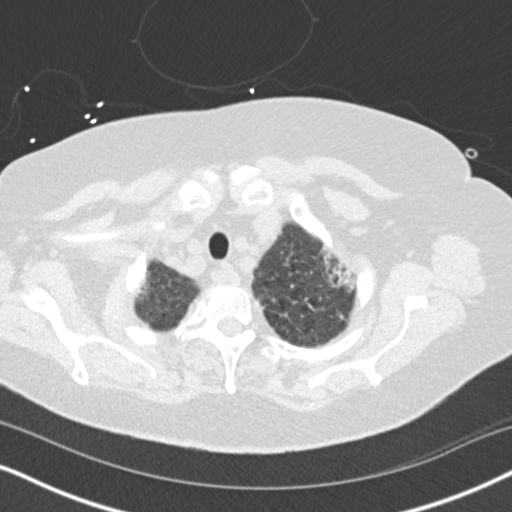
[im 149/158  lung]
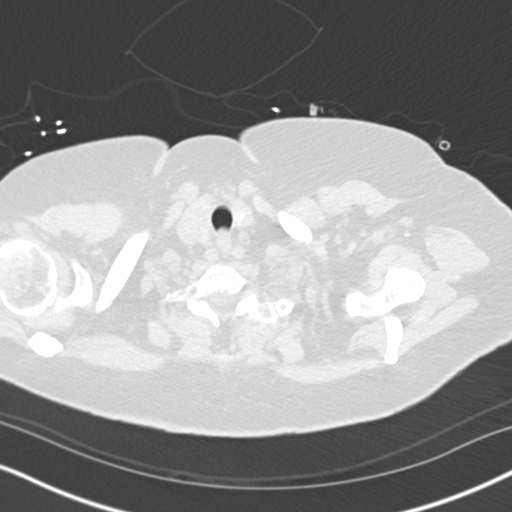

[Series 6: coronal · coronal · 0.62mm/px · 3 of 101 slices shown]
[im 21/101  lung]
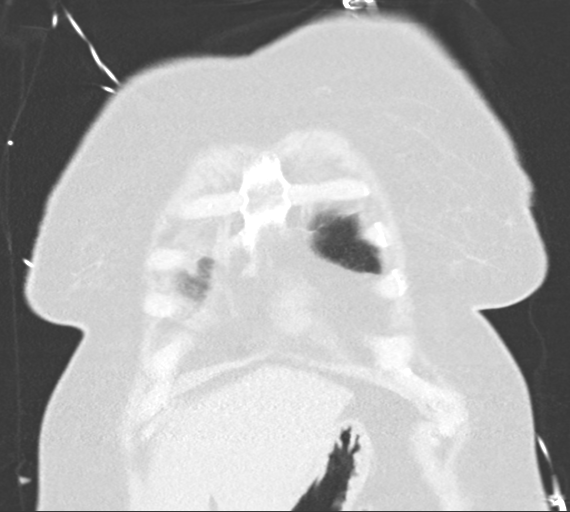
[im 41/101  lung]
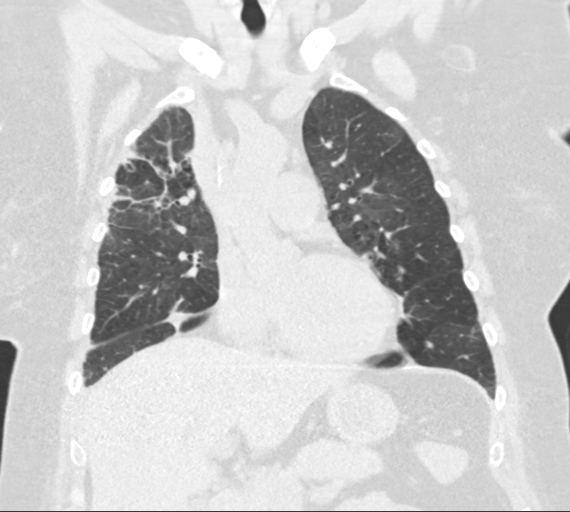
[im 61/101  lung]
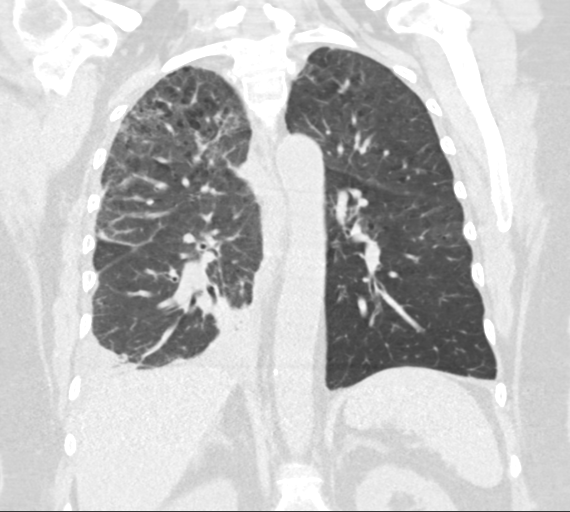

[15 of 36 positions shown; findings below may reference images not displayed]

FINDINGS: Cardiovascular: Atherosclerotic calcifications in thoracic aorta and
proximal great vessels. RIGHT arm PICC line with tip in SVC. Heart
unremarkable. No pericardial effusion.

Mediastinum/Nodes: 2.1 cm diameter nodule RIGHT thyroid lobe
containing a calcification; Recommend thyroid US. (Ref: [HOSPITAL]. [DATE]): 143-50). Scattered normal sized mediastinal
lymph nodes. Enlarged precarinal node 15 mm short axis image 23,
nonspecific. Mildly enlarged subcarinal node 14 mm short axis image
29. No axillary adenopathy.

Lungs/Pleura: Small RIGHT and trace LEFT pleural effusions.
Peribronchial thickening. Scattered infiltrates throughout RIGHT
upper and RIGHT middle lobes as well as LEFT upper lobe/lingula
consistent with pneumonia. Question subpleural nodule posterior
RIGHT upper lobe 6 mm image 34. Underlying emphysematous changes in
upper lobes. Consolidation in RIGHT lower lobe with scattered
subsegmental atelectasis RIGHT lung. Minimal atelectasis LEFT lower
lobe. No pneumothorax.

Upper Abdomen: Diffuse thickening of adrenal glands bilaterally,
slightly more nodular on LEFT. Remaining visualized upper abdomen
unremarkable

Musculoskeletal: No acute osseous findings.
IMPRESSION: Small RIGHT and trace LEFT pleural effusions with scattered
atelectasis in the lower lobes.

Nonspecific mildly enlarged mediastinal lymph nodes.

Scattered pulmonary infiltrates RIGHT upper lobe, RIGHT middle lobe,
and LEFT upper lobe/lingula consistent with multifocal pneumonia.

Associated peribronchial thickening.

2.1 cm diameter RIGHT thyroid lobe nodule containing a
calcification; non emergent follow-up thyroid ultrasound recommended
as above.

Question subpleural nodule posterior RIGHT upper lobe 6 mm;
non-contrast chest CT at 6-12 months is recommended. If the nodule
is stable at time of repeat CT, then future CT at 18-24 months (from
today's scan) is considered optional for low-risk patients, but is
recommended for high-risk patients. This recommendation follows the
consensus statement: Guidelines for Management of Incidental
Pulmonary Nodules Detected on CT Images: From the [HOSPITAL]

Aortic Atherosclerosis (1MH2E-F9A.A) and Emphysema (1MH2E-0TU.T).

## 2021-03-27 IMAGING — CR DG CHEST 2V
2 series · 2 of 2 positions shown · non-contrast
Comparison: Chest radiograph July 27, 2020 and chest CT
July 22, 2020

CLINICAL DATA: Dyspnea

EXAM:
CHEST - 2 VIEW

[chest pa]
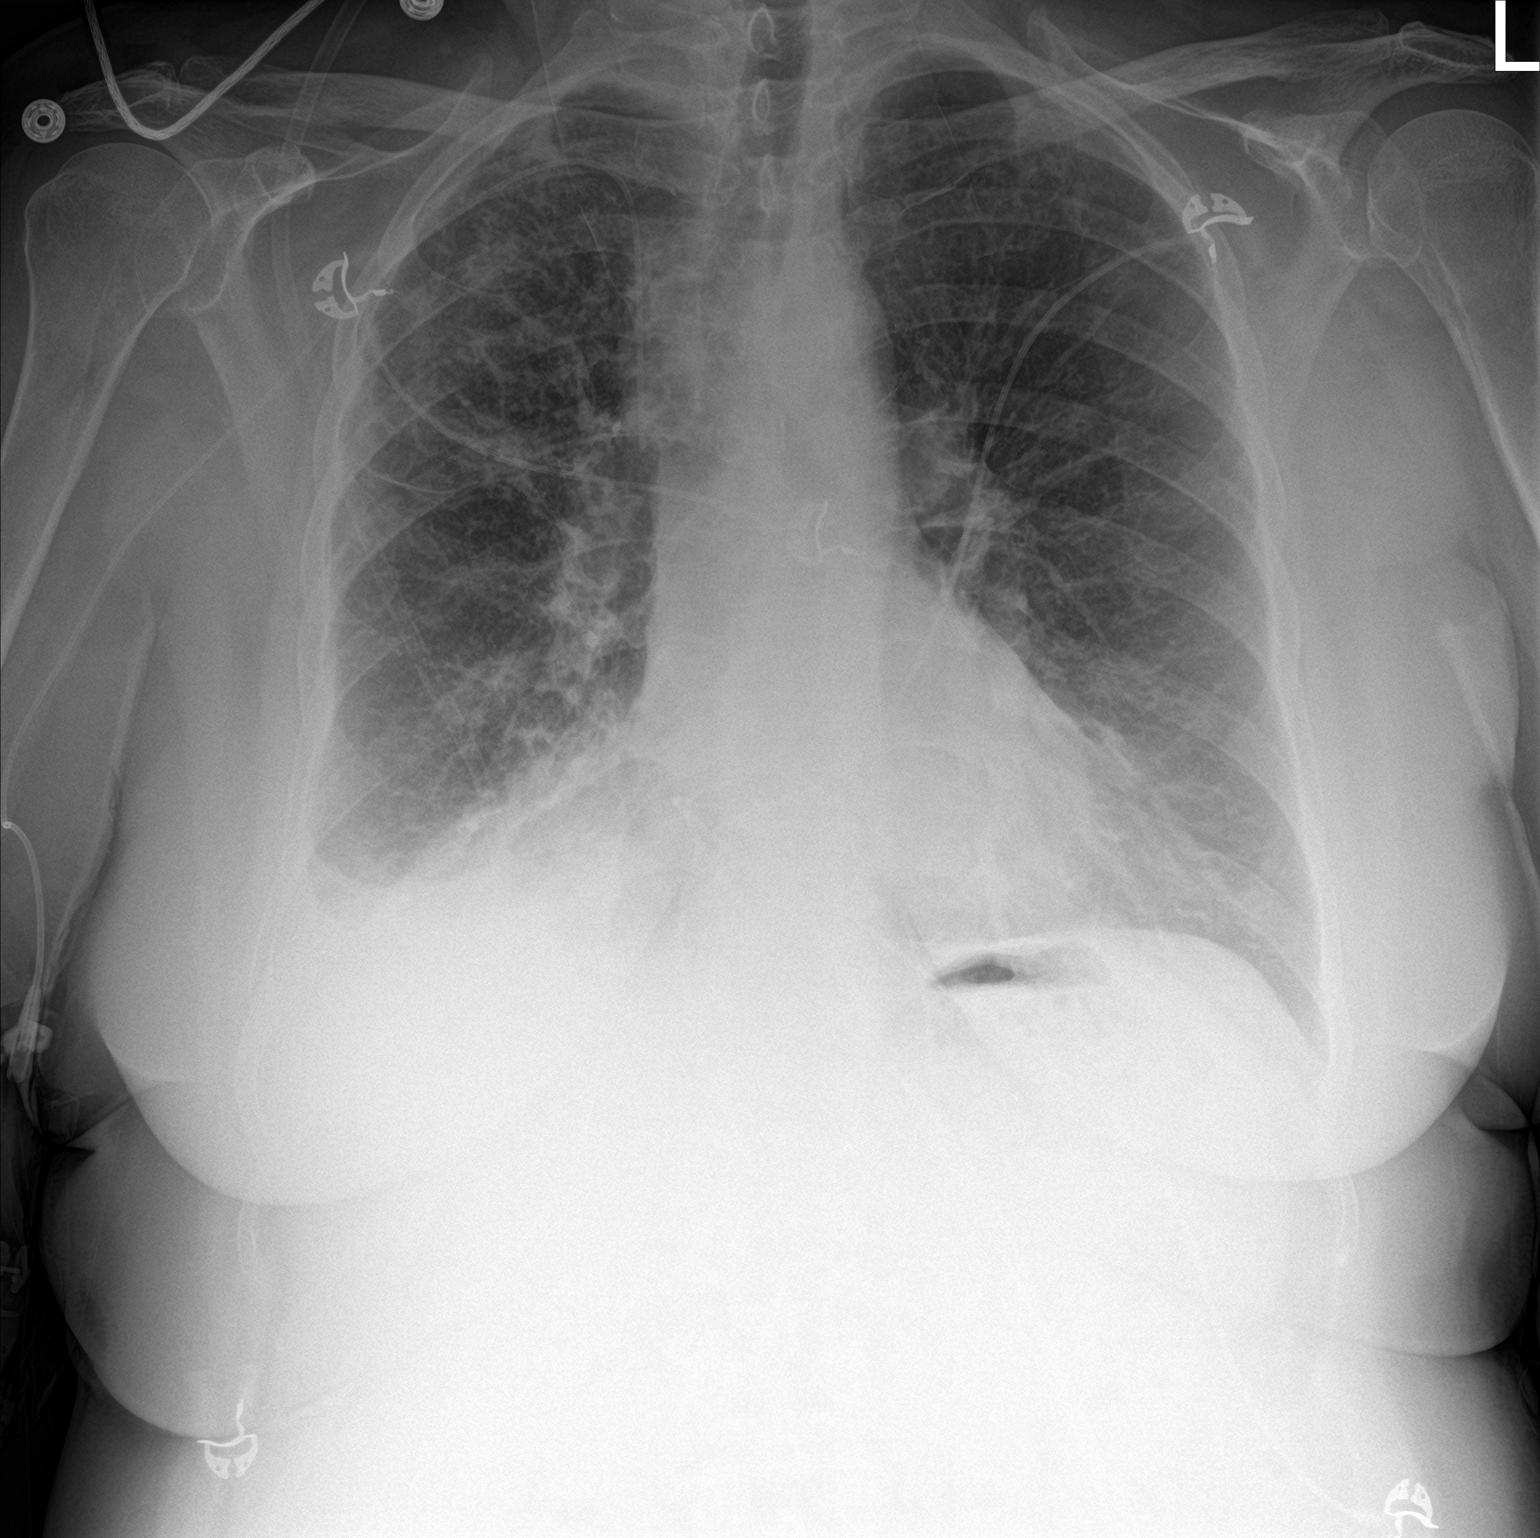

[chest lat]
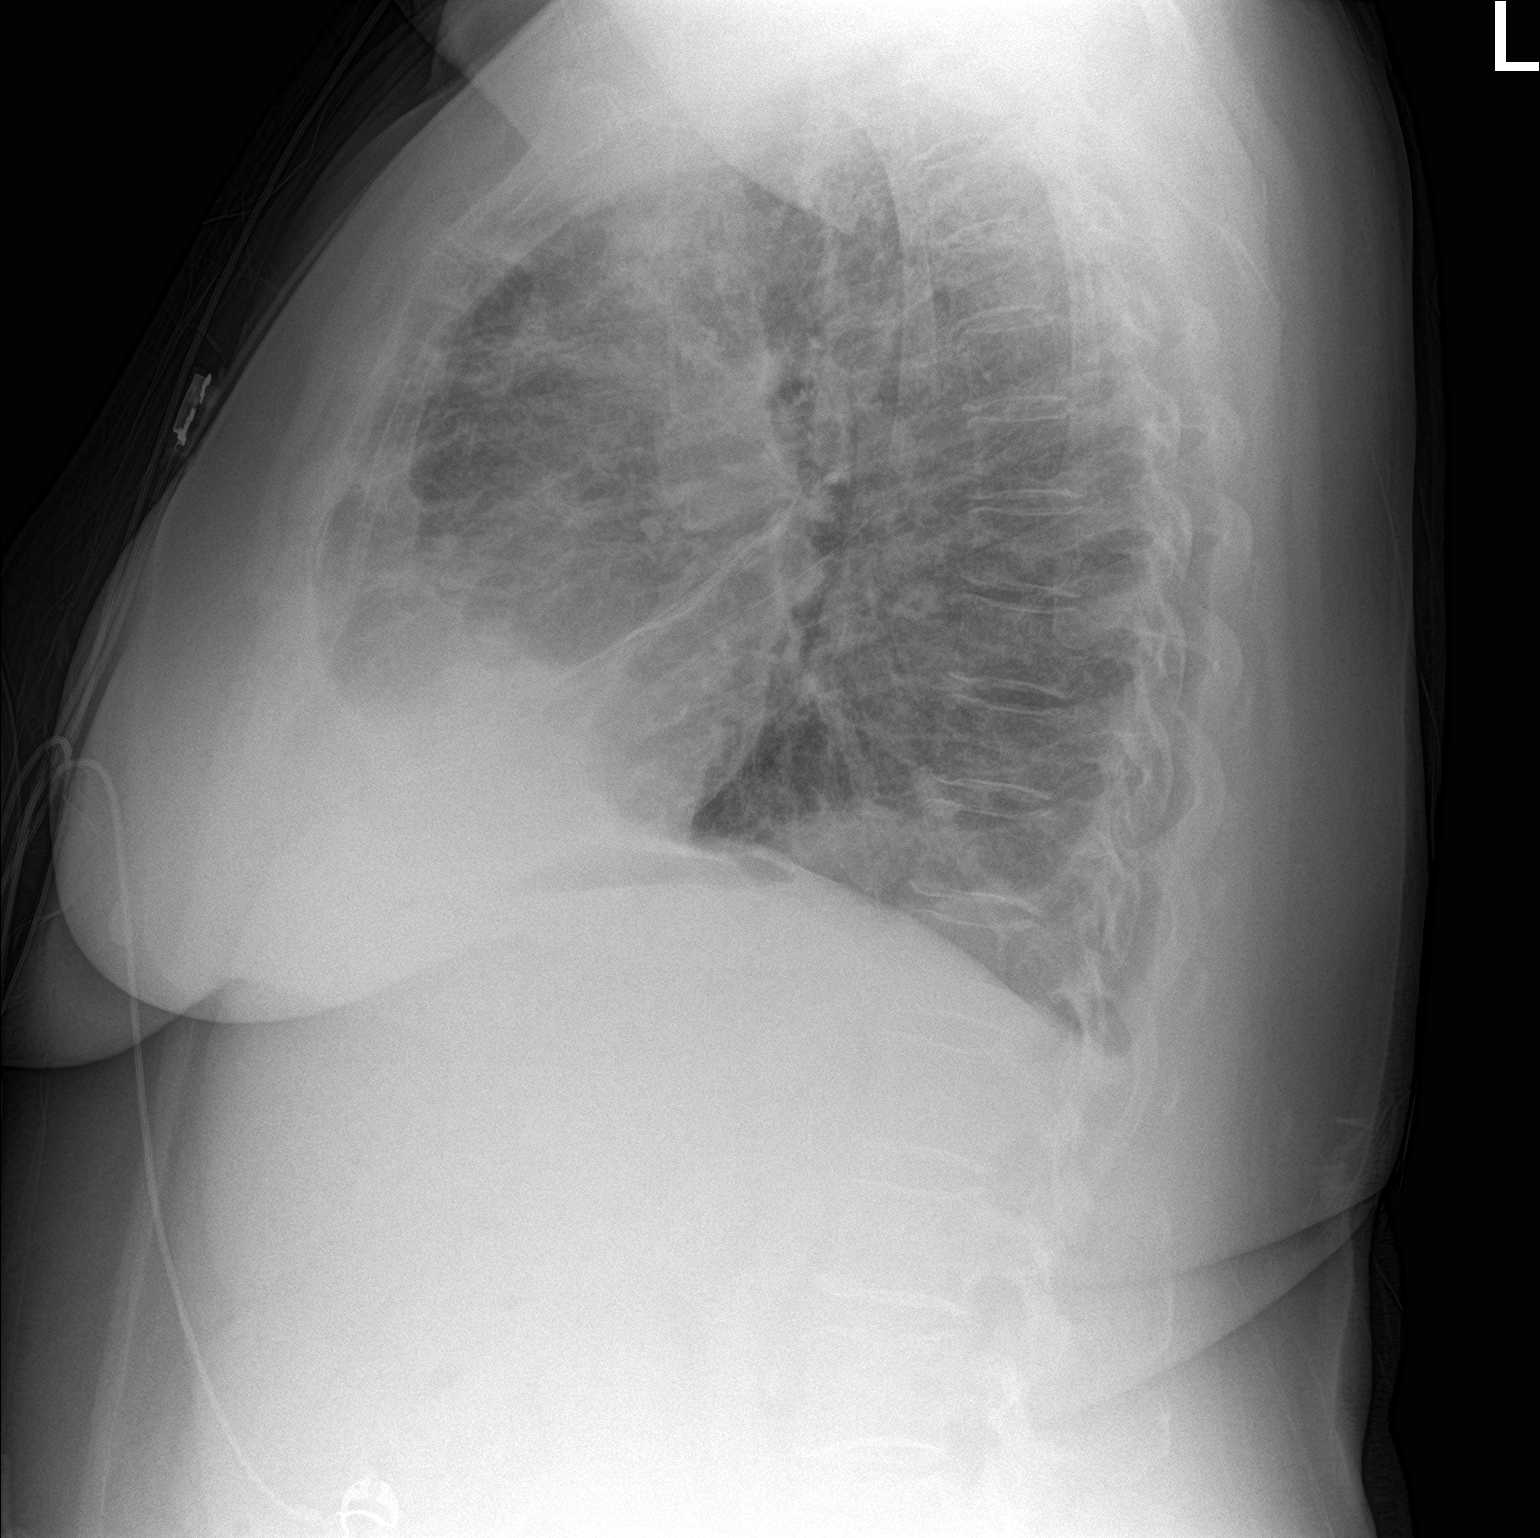

[2 of 2 positions shown; findings below may reference images not displayed]

FINDINGS: The heart size and mediastinal contours are unchanged. Increased
size of small right pleural effusion and adjacent atelectasis. There
is similar airspace opacities in the right upper and bilateral lower
lobes. The visualized skeletal structures are unremarkable.
IMPRESSION: Increased size of the small right pleural effusion and adjacent
atelectasis, with persistent airspace opacity.

## 2021-04-04 ENCOUNTER — Ambulatory Visit: Payer: Medicare HMO | Admitting: Internal Medicine

## 2021-05-03 DEATH — deceased
